# Patient Record
Sex: Female | Born: 1955
Health system: Southern US, Community
[De-identification: ages and names within clinical notes are randomized; demographics above are authoritative.]

## PROBLEM LIST (undated history)

## (undated) DIAGNOSIS — E039 Hypothyroidism, unspecified: Secondary | ICD-10-CM

## (undated) DIAGNOSIS — T4145XA Adverse effect of unspecified anesthetic, initial encounter: Secondary | ICD-10-CM

## (undated) DIAGNOSIS — M797 Fibromyalgia: Secondary | ICD-10-CM

## (undated) DIAGNOSIS — G2581 Restless legs syndrome: Secondary | ICD-10-CM

## (undated) DIAGNOSIS — M1990 Unspecified osteoarthritis, unspecified site: Secondary | ICD-10-CM

## (undated) DIAGNOSIS — Z973 Presence of spectacles and contact lenses: Secondary | ICD-10-CM

## (undated) DIAGNOSIS — F419 Anxiety disorder, unspecified: Secondary | ICD-10-CM

## (undated) DIAGNOSIS — B958 Unspecified staphylococcus as the cause of diseases classified elsewhere: Secondary | ICD-10-CM

## (undated) DIAGNOSIS — I499 Cardiac arrhythmia, unspecified: Secondary | ICD-10-CM

## (undated) DIAGNOSIS — I10 Essential (primary) hypertension: Secondary | ICD-10-CM

## (undated) DIAGNOSIS — R011 Cardiac murmur, unspecified: Secondary | ICD-10-CM

## (undated) DIAGNOSIS — K219 Gastro-esophageal reflux disease without esophagitis: Secondary | ICD-10-CM

## (undated) DIAGNOSIS — T8859XA Other complications of anesthesia, initial encounter: Secondary | ICD-10-CM

## (undated) HISTORY — PX: TONSILLECTOMY: SUR1361

## (undated) HISTORY — PX: CERVICAL FUSION: SHX112

## (undated) HISTORY — PX: TUBAL LIGATION: SHX77

## (undated) HISTORY — PX: BLADDER SUSPENSION: SHX72

## (undated) HISTORY — PX: PICC LINE PLACE PERIPHERAL (ARMC HX): HXRAD1248

---

## 1979-06-24 HISTORY — PX: ABDOMINAL HYSTERECTOMY: SHX81

## 1983-06-24 HISTORY — PX: CYST EXCISION: SHX5701

## 1988-06-23 HISTORY — PX: CERVICAL FUSION: SHX112

## 1999-05-07 ENCOUNTER — Other Ambulatory Visit: Admission: RE | Admit: 1999-05-07 | Discharge: 1999-05-07 | Payer: Self-pay | Admitting: Obstetrics and Gynecology

## 2000-03-18 ENCOUNTER — Encounter: Admission: RE | Admit: 2000-03-18 | Discharge: 2000-03-18 | Payer: Self-pay | Admitting: Obstetrics and Gynecology

## 2000-03-18 ENCOUNTER — Encounter: Payer: Self-pay | Admitting: Obstetrics and Gynecology

## 2000-06-01 ENCOUNTER — Other Ambulatory Visit: Admission: RE | Admit: 2000-06-01 | Discharge: 2000-06-01 | Payer: Self-pay | Admitting: Obstetrics and Gynecology

## 2001-03-26 ENCOUNTER — Encounter: Payer: Self-pay | Admitting: Obstetrics and Gynecology

## 2001-03-26 ENCOUNTER — Encounter: Admission: RE | Admit: 2001-03-26 | Discharge: 2001-03-26 | Payer: Self-pay | Admitting: Obstetrics and Gynecology

## 2001-03-29 ENCOUNTER — Encounter: Admission: RE | Admit: 2001-03-29 | Discharge: 2001-03-29 | Payer: Self-pay | Admitting: Obstetrics and Gynecology

## 2001-03-29 ENCOUNTER — Encounter: Payer: Self-pay | Admitting: Obstetrics and Gynecology

## 2001-06-07 ENCOUNTER — Other Ambulatory Visit: Admission: RE | Admit: 2001-06-07 | Discharge: 2001-06-07 | Payer: Self-pay | Admitting: Obstetrics and Gynecology

## 2002-04-07 ENCOUNTER — Encounter: Admission: RE | Admit: 2002-04-07 | Discharge: 2002-04-07 | Payer: Self-pay | Admitting: Obstetrics and Gynecology

## 2002-04-07 ENCOUNTER — Encounter: Payer: Self-pay | Admitting: Obstetrics and Gynecology

## 2002-04-11 ENCOUNTER — Encounter: Payer: Self-pay | Admitting: Obstetrics and Gynecology

## 2002-04-11 ENCOUNTER — Encounter: Admission: RE | Admit: 2002-04-11 | Discharge: 2002-04-11 | Payer: Self-pay | Admitting: Obstetrics and Gynecology

## 2002-11-04 ENCOUNTER — Ambulatory Visit (HOSPITAL_BASED_OUTPATIENT_CLINIC_OR_DEPARTMENT_OTHER): Admission: RE | Admit: 2002-11-04 | Discharge: 2002-11-04 | Payer: Self-pay | Admitting: Urology

## 2002-12-30 ENCOUNTER — Emergency Department (HOSPITAL_COMMUNITY): Admission: EM | Admit: 2002-12-30 | Discharge: 2002-12-30 | Payer: Self-pay | Admitting: Emergency Medicine

## 2003-01-08 ENCOUNTER — Emergency Department (HOSPITAL_COMMUNITY): Admission: EM | Admit: 2003-01-08 | Discharge: 2003-01-08 | Payer: Self-pay | Admitting: Emergency Medicine

## 2003-01-13 ENCOUNTER — Ambulatory Visit (HOSPITAL_COMMUNITY): Admission: RE | Admit: 2003-01-13 | Discharge: 2003-01-13 | Payer: Self-pay | Admitting: Internal Medicine

## 2003-01-25 ENCOUNTER — Encounter: Admission: RE | Admit: 2003-01-25 | Discharge: 2003-01-25 | Payer: Self-pay | Admitting: Infectious Diseases

## 2003-04-25 ENCOUNTER — Encounter: Admission: RE | Admit: 2003-04-25 | Discharge: 2003-04-25 | Payer: Self-pay | Admitting: Obstetrics and Gynecology

## 2003-08-07 ENCOUNTER — Encounter: Admission: RE | Admit: 2003-08-07 | Discharge: 2003-08-07 | Payer: Self-pay | Admitting: Obstetrics and Gynecology

## 2004-04-15 ENCOUNTER — Inpatient Hospital Stay (HOSPITAL_COMMUNITY): Admission: RE | Admit: 2004-04-15 | Discharge: 2004-04-16 | Payer: Self-pay | Admitting: Neurosurgery

## 2004-06-27 ENCOUNTER — Encounter: Admission: RE | Admit: 2004-06-27 | Discharge: 2004-06-27 | Payer: Self-pay | Admitting: Internal Medicine

## 2005-06-23 HISTORY — PX: BACK SURGERY: SHX140

## 2005-11-24 ENCOUNTER — Ambulatory Visit (HOSPITAL_COMMUNITY): Admission: RE | Admit: 2005-11-24 | Discharge: 2005-11-24 | Payer: Self-pay | Admitting: Neurosurgery

## 2006-01-08 ENCOUNTER — Ambulatory Visit (HOSPITAL_COMMUNITY): Admission: RE | Admit: 2006-01-08 | Discharge: 2006-01-08 | Payer: Self-pay | Admitting: Neurosurgery

## 2006-02-27 ENCOUNTER — Encounter: Admission: RE | Admit: 2006-02-27 | Discharge: 2006-02-27 | Payer: Self-pay | Admitting: Orthopedic Surgery

## 2006-03-25 ENCOUNTER — Ambulatory Visit (HOSPITAL_COMMUNITY): Admission: RE | Admit: 2006-03-25 | Discharge: 2006-03-25 | Payer: Self-pay | Admitting: *Deleted

## 2006-03-25 ENCOUNTER — Encounter (INDEPENDENT_AMBULATORY_CARE_PROVIDER_SITE_OTHER): Payer: Self-pay | Admitting: *Deleted

## 2006-04-09 ENCOUNTER — Inpatient Hospital Stay (HOSPITAL_COMMUNITY): Admission: RE | Admit: 2006-04-09 | Discharge: 2006-04-13 | Payer: Self-pay | Admitting: Neurosurgery

## 2007-03-18 ENCOUNTER — Encounter: Admission: RE | Admit: 2007-03-18 | Discharge: 2007-03-18 | Payer: Self-pay | Admitting: Obstetrics and Gynecology

## 2007-06-11 ENCOUNTER — Encounter: Admission: RE | Admit: 2007-06-11 | Discharge: 2007-06-11 | Payer: Self-pay | Admitting: Neurosurgery

## 2007-06-14 ENCOUNTER — Encounter: Admission: RE | Admit: 2007-06-14 | Discharge: 2007-06-14 | Payer: Self-pay | Admitting: Neurosurgery

## 2007-06-24 HISTORY — PX: FOOT SURGERY: SHX648

## 2008-04-07 ENCOUNTER — Encounter: Admission: RE | Admit: 2008-04-07 | Discharge: 2008-04-07 | Payer: Self-pay | Admitting: Obstetrics and Gynecology

## 2008-05-09 ENCOUNTER — Ambulatory Visit (HOSPITAL_BASED_OUTPATIENT_CLINIC_OR_DEPARTMENT_OTHER): Admission: RE | Admit: 2008-05-09 | Discharge: 2008-05-10 | Payer: Self-pay | Admitting: Orthopedic Surgery

## 2009-04-09 ENCOUNTER — Encounter: Admission: RE | Admit: 2009-04-09 | Discharge: 2009-04-09 | Payer: Self-pay | Admitting: Obstetrics and Gynecology

## 2010-04-02 ENCOUNTER — Encounter: Admission: RE | Admit: 2010-04-02 | Discharge: 2010-04-02 | Payer: Self-pay | Admitting: Neurosurgery

## 2010-04-23 ENCOUNTER — Encounter: Admission: RE | Admit: 2010-04-23 | Discharge: 2010-04-23 | Payer: Self-pay | Admitting: Obstetrics and Gynecology

## 2010-07-13 ENCOUNTER — Encounter: Payer: Self-pay | Admitting: Orthopedic Surgery

## 2010-11-05 NOTE — Op Note (Signed)
NAMEJAKYIAH, [name]               ACCOUNT NO.:  [id number]   MEDICAL RECORD NO.:  [id number]          PATIENT TYPE:  AMB   LOCATION:  DSC                          FACILITY:  [facility]   PHYSICIAN:  Leonides Grills, M.D.     DATE OF BIRTH:  Oct 15, 1955   DATE OF PROCEDURE:  05/09/2008  DATE OF DISCHARGE:                               OPERATIVE REPORT   PREOPERATIVE DIAGNOSES:  1. Right first tarsometatarsal joint arthritis with hypermobility.  2. Right second and third tarsometatarsal joint arthritis.  3. Right hallux valgus.  4. Right tight gastrocnemius.   POSTOPERATIVE DIAGNOSES:  1. Right first tarsometatarsal joint arthritis with hypermobility.  2. Right second and third tarsometatarsal joint arthritis.  3. Right hallux valgus.  4. Right tight gastrocnemius.   OPERATION:  1. Right first tarsometatarsal joint fusion with osteotomy.  2. Right second and third tarsometatarsal joint fusions without      osteotomy.  3. Right local bone graft.  4. Stress x-rays right foot.  5. Right modified McBride bunionectomy.  6. Right great toe digital nerve neurolysis.  7. Right gastrocnemius slide.   ANESTHESIA:  General with popliteal block.   SURGEON:  Leonides Grills, MD   ASSISTANT:  Richardean Canal, PA-C   ESTIMATED BLOOD LOSS:  Minimal.   TOURNIQUET TIME:  Approximately an hour.   COMPLICATIONS:  None.   DISPOSITION:  Stable to PR.   INDICATIONS:  A 55 year old female who has had long-standing medial  midfoot pain as well as a hallux valgus deformity with pain and  metatarsalgia that was interfering with her life to the point she cannot  do what she wants to do with a tight gastroc.  She was consented for the  above procedure.  All risks including infection, nerve or vessel injury,  nonunion, malunion, hardware irritation and hardware failure, persistent  pain, worsening pain, prolonged recovery, stiffness, arthritis,  recurrence of hallux valgus deformity, development of hallux  varus,  persistent second and third metatarsalgia and wound complications were  all explained.  Questions were encouraged and answered.   OPERATION:  The patient was brought to the operating room and placed in  supine position after adequate general endotracheal tube anesthesia was  administered with popliteal blocks as well as Ancef 1 g IV piggyback.  A  small bump was placed on the right ipsilateral hip and the right lower  extremity is prepped and draped in a sterile manner and proximally  placed a thigh tourniquet.  We started the procedure with a longitudinal  incision from medial aspect of the gastrocnemius musculotendinous  junction.  Dissection was carried down through the skin.  Hemostasis was  obtained.  Fascia was opened in line with the incision.  Conjoined  region was then developed through the gastroc soleus.  Soft tissue was  then elevated off the posterior aspect of the gastrocnemius.  Sural  nerve identified and protected posteriorly.  The gastrocnemius was then  released with curved Mayo scissors.  This had excellent release of tight  gastroc.  The area was copiously irrigated with normal saline.  Subcu  was closed with  Vicryl, skin was closed with 4-0 Monocryl subcuticular  stitch, Steri-Strips were applied and limb was then gravity  exsanguinated and the tourniquet was elevated to 290 mmHg.  A  longitudinal incision midline over the medial aspect of the right great  toe MTP joint was then made.  Dissection was carried down through the  skin and hemostasis was obtained.  Dorsomedial great toe digital nerve  was carefully dissected out and a formal digital nerve neurolysis was  then performed.  Once this was freed up, this was carefully retracted  out of harm's way throughout the remaining part of the procedure in this  area.  L-shaped capsulotomy was then made.  Simple bunionectomy was then  performed with a sagittal saw.  Rocky Link Johnson's ridge was then rounded off  with  a rongeur.  The joint and area were copiously irrigated with normal  saline.  Lateral capsule was then released with a curved Beaver blade  protecting the cartilage surfaces with a Therapist, nutritional.  This had an  outstanding release of the tight capsule laterally.  We were then able  to relocate the sesamoids anatomically.  The areas were copiously  irrigated with normal saline.  Then made a longitudinal incision over  the dorsal aspect midline between the EHL and EHB.  Dissection was  carried down through the skin and hemostasis was obtained.  Interval  between EHL and EHB was then developed.  Both tendons were protected in  the respective tendon.  The first and second TMT joints were carefully  dissected out and soft tissue was elevated off of the dorsal aspect of  both of these joints respectively.  There was a large amount of erosion  in both joints.  We then performed a closing wedge plantar flexion  osteotomy of the first TMT joint by removing a progressive bone from  both the medial, plantar and lateral aspect of the medial cuneiform as  well as the base of first metatarsal until the surfaces were perfect on  C-arm guidance using a two-point reduction clamp.  Once this was done,  we removed the remaining cartilage from the second TMT joint as well as  the inner cuneiform area and base of second medial cuneiform region as  well.  Multiple 2-mm drill holes were placed on either side of the  joint.  We then made a longitudinal incision over the third TMT joint  after we visualized with a C-arm that it was completely destroyed in  regards to the articular surface.  Dissection was carried down through  the skin and hemostasis was obtained.  The extensor tendons were  carefully retracted out of harm's way.  Muscle was elevated off the  dorsal aspect of the joint and base of third metatarsal and lateral  cuneiform respectively and the third TMT joint was then entered.  The  remaining  cartilages were then removed from this area and the surfaces  were prepared for anatomic configuration.  Multiple 2-mm holes were  placed on either side of the joint as well.  We then went back to the  first TMT joint and reduced this anatomically with a two-point reduction  clamp and provisionally fixed with a 2 mm K-wire.  We then using a bur  created a notch at the base of the first metatarsal.  We then placed a  3.5-mm fully-threaded cortical lag screw using a 3.5 and 2.5-mm drill  hole respectively through the notch.  This had excellent purchase and  compression across  the fusion site.  K-wire was removed.  This was  replaced with 3.5-mm fully-threaded cortical lag screw using a 3.5 and  2.5-mm drill respectively.  This had excellent purchase and compression  across the fusion site.  We then reduced the second TMT joint and placed  two 3.5-mm fully threaded cortical set screws using a 2.5-mm drill hole  respectively.  This was done through a small incision medially.  This  had excellent compression and maintenance of compression across the  second TMT joint.  We then reduced the third TMT joint with a two-point  reduction clamp and with a bur created a notch at the base of the third  metatarsal.  We then placed a 3.5-mm fully threaded cortical set screws  and 2.5 mm drill hole respectively.  This had excellent purchase and  maintenance of the compression across this joint but an hour to the  procedure, we had to deflate the tourniquet because we were developing a  venous tourniquet and we went without tourniquet due to her obesity and  the fact that we did not have a bloodless field.  This made the  procedure a lot more difficult but we were still able to obtain the  compression and anatomic correction required.  We then returned back to  the great toe MTP joint, copiously irrigated the area with normal saline  and then with the toe held in reduced position as well as the sesamoids   located, we reconstructed and advanced the medial capsule with a 2-0  Vicryl stitch.  This was advanced both superiorly and proximally.  This  had an Conservation officer, historic buildings.  Range of motion of the toe was excellent.  Stress x-rays were obtained in AP, lateral, and oblique views.  This  showed no gross motion, fixation, proposition, and excellent alignment  as well.  Sesamoids were located.  There was no pulsatile bleeding.  Subcu was closed with 4-0 PDS.  Skin was closed with 4-0 nylon.  Sterile  dressing was applied.  Roger Mann dressing was applied.  Modified Jones  dressing was applied with the ankle in neutral dorsiflexion.  The  patient was stable to the PR.      Leonides Grills, M.D.  Electronically Signed     PB/MEDQ  D:  05/09/2008  T:  05/09/2008  Job:  [id number]

## 2010-11-08 NOTE — Discharge Summary (Signed)
[name], BARLEY               ACCOUNT NO.:  [id number]   MEDICAL RECORD NO.:  [id number]          PATIENT TYPE:  INP   LOCATION:  3033                         FACILITY:  [facility]   PHYSICIAN:  [name], M.D.DATE OF BIRTH:  10-23-1955   DATE OF ADMISSION:  04/09/2006  DATE OF DISCHARGE:  04/13/2006                                 DISCHARGE SUMMARY   ADMISSION HISTORY AND PHYSICAL EXAMINATION:  Patient is a 55 year old woman  who presented with a left lumbar radiculopathy noted to left L5-S1 synovial  cyst resulting from advanced left L5-S1 facet arthropathy.  Decision was to  proceed with resection of synovial cyst and arthrodesis at the L5-S1 level.  Further details of her history are included in her admission note.  Examination revealed intact strength and sensation.  Again further details  of her admission physical examination are included in her admission note.   HOSPITAL COURSE:  The patient was admitted and underwent bilateral L5-S1  lumbar laminotomy/facetectomy, microdiscectomy and resection of the left L5-  S1 synovial cyst and bilateral L5-S1 posterior lumbar interbody fusion and  with AVS peak interbody implants and VITOSS in the bilateral L5-S1 posterior  lumbar arthrodesis with 90D posterior instrumentation.   Postoperatively, the patient has had moderate residual discomfort.  Overall,  the lower extremities have felt better.  She also had difficulties with  muscular spasm.  She was initially started on Flexeril, but Dr. Jeral Fruit was  covering over the weekend, started her on Valium yesterday as well.  She is  still having some muscular spasm, but is continuing to the Flexeril and  Valium as needed.  Her Foley was discontinued and she was able to void  without difficulty and, at this point, we are discharging her home with  instructions regarding wound care and activities.  Her wound is healing  nicely.  There is no erythema, swelling or drainage.  She had been  given  prescriptions for Percocet one to two tablets p.o. q.4 to 6 hours p.r.n.  pain, 60 tablets prescribed, no refills.  Flexeril 10 mg t.i.d. p.r.n.  muscle spasms, 50 tablets prescribed, one refill and Valium 5 mg tablets one  t.i.d. p.r.n. muscle spasms, 50 tablets prescribed, no refills.  She has  also been prescribed a rolling walker with 5 inch wheels and a 3-in-1 to be  used as a raised toilet seat.  She is to return to my office in 3 weeks for  follow up with AP and lateral lumbar spine x-rays.   DISCHARGE DIAGNOSES:  1. Lumbar synovial cyst.  2. Lumbar spondylosis.  3. Lumbar degenerative disc disease.  4. Lumbo radiculopathy.  5. Lumbar spondylothesis.      [name], M.D.  Electronically Signed     RWN/MEDQ  D:  04/13/2006  T:  04/13/2006  Job:  [id number]

## 2010-11-08 NOTE — Op Note (Signed)
NAMESAFFRON, BUSEY               ACCOUNT NO.:  [id number]   MEDICAL RECORD NO.:  [id number]          PATIENT TYPE:  INP   LOCATION:  2899                         FACILITY:  [facility]   PHYSICIAN:  [name], M.D.DATE OF BIRTH:  1955/09/07   DATE OF PROCEDURE:  04/15/2004  DATE OF DISCHARGE:                                 OPERATIVE REPORT   PREOPERATIVE DIAGNOSES:  1.  C3-4 and C4-5 spondylitic disk herniations.  2.  Spondylosis.  3.  Degenerative disk disease.  4.  Stenosis.   POSTOPERATIVE DIAGNOSES:  1.  C3-4 and C4-5 spondylitic disk herniations.  2.  Spondylosis.  3.  Degenerative disk disease.  4.  Stenosis.   PROCEDURE:  C3-4 and C4-5 anterior cervical diskectomy arthrodesis with  iliac crest allograft and ____________ cervical plating.   SURGEON:  [name], M.D.   ASSISTANT:  Danae Orleans. Venetia Maxon, M.D.   ANESTHESIA:  General endotracheal.   INDICATION:  The patient is a 55 year old woman, who presented with neck  pain and bilateral cervical radicular symptoms, right worse than left.  She  was found to have rather advanced spondylosis and degenerative disk disease  at C3-4 and C4-5 with associated spondylitic disk herniation and relative  canal stenosis, and the decision was made to proceed with decompression and  arthrodesis.   DESCRIPTION OF PROCEDURE:  The patient was brought to the operating room and  placed under general endotracheal anesthesia.  The patient was placed in 10  pounds of halter traction.  Neck was prepped with Betadine surgical solution  and draped in a sterile fashion.  A horizontal incision was made in the left  neck.  The incision was infiltrated with local anesthetic with epinephrine.  Bipolar electrocautery was used to maintain hemostasis.  Dissection was  carried down to the subcutaneous tissue and then to the platysma, and then  dissection was carried through the avascular plane ____________  jugular  vein laterally and the  trachea and esophagus medially.  The ventral aspects  of the vertebral column was identified and a localizing x-ray taken at C3-4  and C4-5 intervertebral disk space was identified.  Diskectomy was begun at  each level of the incision and the annulus continued with microcurettes and  pituitary rongeurs.  The microscope was draped and brought onto the field to  provide additional magnification, illumination, and visualization, and the  remainder of the dissection was performed using microdissection and  microsurgical technique.  The cartilaginous endplates of each of the  foraminal bodies were removed using microcurettes along with the Xmax drill,  and then the posterior overgrowth was removed using the Xmax drill along  with the 2 mm Kerrison punch with a foot plate.  There was significant  spondylitic overgrowth posteriorly at each level, and this was carefully  removed along with the thickened posterior longitudinal ligament.  The  spondylitic disk herniation was removed, and good decompression of the  spinal canals was achieved at each level.  Hemostasis was established with  the use of Gelfoam soaked in thrombin, and then anterior osteophytic  overgrowth was removed using an  osteophyte removal tool.  We used bone  sizers to select the height of grafts for each of the intervertebral disk  spaces.  A 7 mm graft was used at C3-4 and a 6 mm graft at the C4-5.  Each  was hydrated in a saline solution and positioned in the intravertebral disk  space and countersunk.  We then selected a 29 mm tether cervical plate, and  it was positioned over the fusion construct and secured to the C3 and C5  vertebra with a pair of 4 x 14 mm screws and a single 4 x 4 two millimeter  screws and a single 4 x 4 two millimeter screw used at C4.  Each screw hole  was drilled and tapped and the screw was placed.  Final tightening was done  of all five screws, and then an x-ray was taken with showed the grafts,   plates, and screws all in position.  The cervical traction had been  discontinued.  Once the bone grafts were in place but prior to the plating,  the wound was irrigated with Bacitracin solution, checked for hemostasis,  and then once that was confirmed we proceeded with closure of the platysma,  closed with interrupted inverted 3-0 Vicryl suture, the subcutaneous and  subcuticular closed with interrupted inverted 3-0 Vicryl sutures.  The skin  is reapproximated with Dermabond.  The patient tolerated the procedure well.  The estimated blood loss was 100 mL.  Sponge and needle count correct.  Following surgery, the patient was reversed anesthetic, extubated, and  transferred to the recovery room for further care where she was noted to be  moving all four extremities.       RWN/MEDQ  D:  04/15/2004  T:  04/15/2004  Job:  [id number]

## 2010-11-08 NOTE — Op Note (Signed)
NAME:  [name], [name]               ACCOUNT NO.:  [id number]   MEDICAL RECORD NO.:  [id number]          PATIENT TYPE:  AMB   LOCATION:  SDS                          FACILITY:  [facility]   PHYSICIAN:  [name], M.D.DATE OF BIRTH:  April 28, 1956   DATE OF PROCEDURE:  01/08/2006  DATE OF DISCHARGE:                                 OPERATIVE REPORT   PREOPERATIVE DIAGNOSIS:  Bilateral carpal tunnel syndrome.   POSTOPERATIVE DIAGNOSIS:  Bilateral carpal tunnel syndrome.   PROCEDURE:  Left carpal tunnel release.   SURGEON:  [name], M.D.   ANESTHESIA:  General endotracheal.   INDICATIONS:  The patient is a 55 year old woman who presented with  bilateral carpal tunnel syndrome.  She is a month and a half status post a  right carpal tunnel release, from which she has done well, and she returns  now for a left carpal tunnel release.   PROCEDURE:  The patient was brought to the operating room and placed under  general endotracheal anesthesia.  The left upper extremity was prepped with  Betadine solution and draped in a sterile fashion.  A curvilinear incision  was made just medial to the left thenar crease, extending from the distal  carpal crease distally into the palm.  Bipolar cautery was used to maintain  hemostasis.  Dissection was carried down through the subcutaneous tissue to  the transverse carpal ligament, which was divided from distally to  proximally.  Care was taken to avoid any injury to the underlying median  nerve, and good decompression of the median nerve was achieved.  The  transverse carpal ligament was fully opened from its distal-to-proximal  extent, then bipolar cautery was used to maintain hemostasis, and then we  proceeded with closure.  The subcutaneous layer was approximated with  interrupted and threaded 2-0 Vicryl sutures, and the skin was reapproximated  with interrupted horizontal mattress suture using 4-0 nylon.  We then  dressed the wound with  Adaptic, gauze and fluff, and was wrapped with a  Kling.  The procedure was tolerated well.  The estimated blood loss was less  than 10 mL.  Sponge and needle counts were correct.  Following surgery, the  patient was to be reversed from the anesthetic and transferred to the  recovery room for further care.      [name], M.D.  Electronically Signed     RWN/MEDQ  D:  01/08/2006  T:  01/08/2006  Job:  [id number]

## 2010-11-08 NOTE — H&P (Signed)
[name], [name]               ACCOUNT NO.:  [id number]   MEDICAL RECORD NO.:  [id number]          PATIENT TYPE:  INP   LOCATION:  3033                         FACILITY:  [facility]   PHYSICIAN:  [name], M.D.DATE OF BIRTH:  07-16-1955   DATE OF ADMISSION:  04/09/2006  DATE OF DISCHARGE:  04/13/2006                                HISTORY & PHYSICAL   HISTORY OF PRESENT ILLNESS:  Patient is a 55 year old right-handed, white  female, who has been a patient of mine for several years.  She is status  post C5-6 and C6-7 anterocervical discectomy effusion in 1990 and C3-4 and  C4-5 anterocervical discectomy in October of 2005.  She is status post  bilateral carpal tunnel releases earlier this year and she has presented  with a lumbar radiculopathy.  X-rays revealed L5-S1 facet arthropathy with a  grade I degenerative spine at L5 and S1.  MRI scan shows degenerative facet  arthropathy L5-S1, worse on the left with a small synovial cyst on the left  compressed actually to the S1 nerve root.  The patient admitted now for  decompression and arthrodesis.   PAST MEDICAL HISTORY:  Is notable for:  1. Hypothyroidism.  2. Fibromyalgia.  3. Osteoarthritis.  4. Restless leg syndrome.  5. Hiatal hernia.  6. Gastroesophageal reflux disease.   However, she does not have any history of hypertension, myocardial  infarction, gastroesophageal reflux disease or lung disease.   PREVIOUS SURGERIES:  Includes:  1. Hysterectomy.  2. Bladder sling.  3. C3-4 to C4-5 ACDF.  4. C5-6 and C6-7 ACDF.  5. Bilateral carpal tunnel releases.   MEDICATIONS AT THE TIME OF ADMISSION:  Include Vicodin p.r.n. for pain and  vitamin C.   OTHER MEDICATIONS:  Include Lyrica, Synthroid, Wellbutrin, Cymbalta,  potassium chloride, Mirapex, Voltaren, Lasix, Allegra, Protonix and Peri-  Colace.   FAMILY HISTORY:  Her mother is in great health at age 74.  Her father is in  good health at age 99 with diabetes,  heart disease and neuropathy.   SOCIAL HISTORY:  The patient is married.  She is not working.  She has never  smoked, drank or had history of substance abuse.   REVIEW OF SYSTEMS:  Notable for what is described in the history of present  illness and past medical history, but is otherwise unremarkable.   PHYSICAL EXAMINATION:  GENERAL:  Patient is a well-developed, well-nourished  white female in no acute distress.  LUNGS:  Clear to auscultation.  She has symmetrical respirations and  excursions.  HEART:  Regular rate and rhythm with S1 and S2.  There is no murmur.  ABDOMEN:  Soft, nondistended and bowel sounds are present.  VITAL SIGNS:  Temperature is 98.7.  Pulse 104.  Blood pressure 152/96.  Respiratory rate 16.  Height 5 feet 5 inches.  Weight 278 pounds.  EXTREMITY EXAMINATION:  Shows no clubbing, cyanosis or edema.  MUSCULOSKELETAL EXAMINATION:  Shows she is able to flex and bend.  She is  able to extend only to 5 to 10 degrees and is limited by pain.  Straight leg  raising is negative bilaterally.  NEUROLOGIC EXAMINATION:  Shows 5/5 strength to the lower extremities  including iliopsoas, quadriceps, dorsiflexors, EHL and plantar flexor  bilaterally.  Sensation is intact to pinprick.  The left quadriceps is 1,  the right is absent.  Gastrocnemious are minimal bilaterally.  Toes are  equivocal bilaterally.  She has normal gait and stance.   IMPRESSION:  Patient with disabling left lumbar radiculopathy despite NSAIDs  and narcotic analgesics.  She has advanced facet arthropathy on the left  side at L5-S1 with a small associated synovial cyst.  Rule out degenerative  changes are seen, otherwise, to the lumbar spine.   PLAN:  Patient will be admitted for resection of her left L5-S1 synovial  cyst and a bilateral L5-S1 posterior lumbar interbody fusion with interbody  implants and bone graft and bilateral L5-S1 posterolateral arthrodesis with  90D instrumentation, bone graft.  We  discussed the alternatives to surgery  the nature of the surgical procedures itself.  Typical limitations post-  operatively, the need for postoperative immobilization in a lumbar corset  and risks which include the risk of infection, bleeding, possible need for  transfusion, risk of nerve root dysfunction with pain, weakness, numbness,  or parasthesias, the possible need for further surgery, the risk of  recurrent disc herniation, possible need for further surgery and anesthetic  risk and myocardial infarction, stroke, pneumonia and death.  She does  understand that we will use cell saver during surgery to reduce the  likelihood of requiring transfusion.  [name] had understanding of all of  this, she wishes to go ahead with surgery and is admitted.      [name], M.D.  Electronically Signed     RWN/MEDQ  D:  04/13/2006  T:  04/13/2006  Job:  [id number]

## 2010-11-08 NOTE — Op Note (Signed)
NAMELISSY, [name]               ACCOUNT NO.:  [id number]   MEDICAL RECORD NO.:  [id number]          PATIENT TYPE:  AMB   LOCATION:  ENDO                         FACILITY:  [facility]   PHYSICIAN:  Georgiana Spinner, M.D.    DATE OF BIRTH:  [birth year]   DATE OF PROCEDURE:  03/25/2006  DATE OF DISCHARGE:                                 OPERATIVE REPORT   PROCEDURE:  Upper endoscopy with biopsy.   INDICATIONS:  GERD, rule out Barrett's esophagus.   ANESTHESIA:  Fentanyl 75 mcg, Versed 5 mg.   PROCEDURE:  With the patient mildly sedated in the left lateral decubitus  position, the Olympus videoscopic endoscope was inserted in the mouth,  passed under direct vision through the esophagus which appeared normal until  we reached distal esophagus, and there was a flame of Barrett's esophagus  photographed and biopsied.  We entered into the stomach.  The fundus, body,  antrum, duodenal bulb, second portion duodenum were visualized.  From this  point the endoscope was slowly withdrawn, taking circumferential views of  duodenal mucosa until the endoscope had been pulled back into the stomach  and placed in retroflexion to view the stomach from below.  The endoscope  was straightened and withdrawn, taking circumferential views of the  remaining gastric and esophageal mucosa, stopping to biopsy a fundic polyp.  The patient's vital signs, pulse oximeter remained stable.  The patient  tolerated procedure well without apparent complications.   FINDINGS:  Fundic gland polyp, Barrett's esophagus.  Await biopsy reports.  The patient will call me for results and follow up with me as an outpatient.           ______________________________  Georgiana Spinner, M.D.     GMO/MEDQ  D:  03/25/2006  T:  03/26/2006  Job:  [id number]

## 2010-11-08 NOTE — Op Note (Signed)
NAME:  [name], [name]                         ACCOUNT NO.:  [id number]   MEDICAL RECORD NO.:  [id number]                   PATIENT TYPE:  AMB   LOCATION:  [facility]                                 FACILITY:  [facility]   PHYSICIAN:  Mark C. Vernie Ammons, M.D.               DATE OF BIRTH:  16-Apr-1956   DATE OF PROCEDURE:  11/04/2002  DATE OF DISCHARGE:                                 OPERATIVE REPORT   PREOPERATIVE DIAGNOSIS:  Stress urinary incontinence.   POSTOPERATIVE DIAGNOSIS:  Stress urinary incontinence.   PROCEDURE:  SPARK sling.   SURGEON:  Mark C. Vernie Ammons, M.D.   ANESTHESIA:  General.   ESTIMATED BLOOD LOSS:  Less than 10 cc.   DRAINS:  None.   SPECIMENS:  None.   COMPLICATIONS:  None.   INDICATIONS:  The patient is a 55 year old white female with stress urinary  incontinence.  It began about 25 years ago and had become progressively  worse.  She was found on exam to have loss of ureterovesical junction  support with marked Q-tip hypermobility and no significant cystocele.  She  was therefore brought to the OR today for SPARK sling.  The risks,  complications, and alternatives and limitations were discussed.   DISCUSSION OF OPERATION:  After informed consent, the patient was brought to  the major OR and placed on the table and administered general anesthesia,  and moved to the dorsal lithotomy position.  The lower abdomen, genitalia,  and vagina were sterilely prepped and draped and a weighted speculum placed  in the vagina and a 16 French Foley catheter in the bladder.  I palpated the  level of the mid urethra and then injected with 1% lidocaine with  epinephrine in the subvaginal mucosa and then made a midline incision in the  vaginal mucosa dissecting laterally and again palpating the mid urethral  region.  Attention was directed to the suprapubic region where two stab  incisions were made three fingerbreadths apart.  I then made sure the  bladder was completely drained,  put the cystoscope sheath in the bladder,  and used that to move the bladder neck away from the side I was passing the  sling trocar.  The trocar was then passed into the suprapubic stab incision,  first on the left side and then right side, and out through the mid urethral  region.  SPARK sling material was then affixed to the trocars and brought  back up through the abdominal incisions.  I then replaced the Foley  catheter, placed my finger beneath the urethra and initially positioned the  sling in good position at the mid urethral level with no kinking, twisting,  or binding of the sling material.  With this in place, and no tension noted,  the sheaths were removed from the sling and sling material and the excess  material cut at skin level.  I irrigated the suprapubic incisions with  antibiotic solution and closed those with Dermabond.  The vaginal incision  was then closed with running 2-0 Vicryl suture.  Prior to closure, the area  of the sling and suburethral region were irrigated with antibiotic solution.  The patient tolerated the procedure well.  No  intraoperative complications.  Sponge, needle, and instrument counts were  reported as correct at the end of the operation.  She will be given a  prescription for Tylox #38 and Cipro XR 500 mg #7 and return to my office in  one week.                                               Mark C. Vernie Ammons, M.D.    MCO/MEDQ  D:  11/04/2002  T:  11/04/2002  Job:  [id number]

## 2010-11-08 NOTE — Op Note (Signed)
NAME:  [name], [name]               ACCOUNT NO.:  [id number]   MEDICAL RECORD NO.:  [id number]          PATIENT TYPE:  INP   LOCATION:  3033                         FACILITY:  [facility]   PHYSICIAN:  [name], M.D.DATE OF BIRTH:  Aug 06, 1955   DATE OF PROCEDURE:  04/09/2006  DATE OF DISCHARGE:                                 OPERATIVE REPORT   PREOPERATIVE DIAGNOSES:  Left L5-S1 synovial cyst, lumbar spondylosis,  lumbar degenerative disk disease and lumbar radiculopathy.   POSTOPERATIVE DIAGNOSES:  Left L5-S1 synovial cyst, lumbar spondylosis,  lumbar degenerative disk disease and lumbar radiculopathy.   PROCEDURES:  Bilateral L5-S1 lumbar laminotomy, facetectomy, and  microdiskectomy; resection of left L5-S1 synovial cyst; bilateral L5-S1  posterior lumbar interbody fusion with AVS PEEK interbody implants and  Vitoss with bone marrow aspirate; and bilateral L5-S1 posterolateral  arthrodesis with 90D posterior instrumentation and Infuse and Vitoss with  bone marrow aspirate and microdissection.   SURGEON:  [name], MD.   ASSISTANTS:  Cristi Loron, MD.; Nelia Shi. Webb Silversmith, RN.   INDICATIONS:  The patient is a 54 year old woman, who presented with a left  lumbar radiculopathy, which was found to be due to a left L5-S1 synovial  cyst compressing the existing left S1 nerve root.  There is significant  underlying facet arthropathy bilaterally, left worse than right.  The  decision was made to proceed with decompression and arthrodesis.   DESCRIPTION OF PROCEDURES:  The patient was brought to the operating room  and placed under general endotracheal anesthesia.  The patient was returned  to the prone position.  The lumbar region was prepped with Betadine Soap and  Solution and draped out in sterile fashion.  The midline was infiltrated  with local anesthetic with epinephrine.  An x-ray was taken to localize the  L5-S1 level, and a midline incision was made,  carried down through the  subcutaneous tissue.  Bipolar cautery and electrocautery were used to  maintain hemostasis.  Dissection was carried down to the lumbar fascia,  which was incised bilaterally, and then the paraspinal muscles were  dissected away from the spinous processes and laminae in subperiosteal  fashion.  Self-retaining retractors were placed.  Another x-ray was taken  and the L5-S1 level identified, and then dissection was carried out  laterally, exposing the arthropathic facets bilaterally.  The microscope was  draped and brought into the field to provide additional magnification,  illumination and visualization, and the decompression was performed using  microdissection and microsurgical technique.  Bilateral laminotomy and  facetectomy were performed using the X-Max drill and Kerrison punches.  We  were able to remove the thickened ligamentum flavum, as well as carefully  dissect the synovial cyst on the left side from the dura, from which it was  somewhat adherent.  We were able to mobilize and remove the synovial cyst in  its entirety, and we were able to decompress the thecal sac and the exiting  L5 and S1 nerve roots bilaterally.  We then identified the angles of the L5-  S1 disk.  The original epidural veins were coagulated  and sharply divided,  and then we incised the annulus bilaterally.  We proceeded with a thorough  diskectomy using a variety of microcurets and pituitary rongeurs.  The  spondylytic overgrowth on the posterior aspect of the vertebral bodies was  removed using a Kerrison punch, and we then removed the cartilaginous  endplates of the vertebral bodies to get down to a bony surface for the  arthrodesis.  We then measured the height of the intervertebral disk space  and selected 10-mm in height interbody implants.  We then probed the left S1  pedicle, aspirated the bone marrow aspirate, which had been injected over a  10-cc strip of Vitoss.  We then  selected 10 mm x 25 mm, 4-degree AVS PEEK  interbody PLIF implants.  They were packed with the Vitoss with bone marrow  aspirate, and then carefully retracting the thecal sac and nerve root, first  on 1 side and then on the other, the interbody implants were positioned in  the intervertebral disk space and countersunk.  We then brought the C-arm  fluoroscope into the operative field.  Unfortunately, even with the C-arm  fluoroscope, visualization of the pedicles and vertebral bodies was severely  limited due to the patient's obesity.  Nevertheless, both with C-arm  fluoroscopic guidance and under direct visualization, pedicle entry sites  were identified for L5 bilaterally, as well as on the right side at S1.  We  had already probed the pedicle of the left side of S1.  Each of the pedicles  was probed, examined, then, with a ball probe, and no cutouts were found.  Each of the pedicles was tapped.  Again, it was examined with a ball probe  and, again, no cutouts were found.  At L5, we placed 5.75 x 40-mm screws  bilaterally at S1.  At S1, we placed 5.75 x 35-mm screws bilaterally.  Each  pedicle had been tapped with a 5.25-mm tap.  Once both sets of screws were  in place, we selected a 30-mm, prelordosed rod.  It was placed in the screw  head and secured with locking caps.  Final tightening was done of all 4  locking caps.  We had decorticated the transverse process of L5 and the ala  of S1.  We then packed Infuse and Vitoss with bone marrow aspirate over the  transverse process and ala and in the intertransverse space, and then  proceeded with closure.  The fascia was closed with interrupted, undyed 1  Vicryl suture.  Scarpa's fascia was closed with interrupted, undyed 1 Vicryl  suture.  The subcutaneous, subcuticular were closed with interrupted,  inverted 2-0 undyed Vicryl suture, and the skin edges were closed with  Dermabond.  The wound was dressed with Adaptic and sterile gauze,  and following the procedure, the patient was turned back to a supine position to  be reversed from the anesthetic, extubated and transferred to the recovery  room in for further care.      [name], M.D.  Electronically Signed     RWN/MEDQ  D:  04/09/2006  T:  04/10/2006  Job:  [id number]

## 2010-11-08 NOTE — Op Note (Signed)
NAME:  [name], [name]               ACCOUNT NO.:  [id number]   MEDICAL RECORD NO.:  [id number]          PATIENT TYPE:  AMB   LOCATION:  SDS                          FACILITY:  [facility]   PHYSICIAN:  [name], M.D.DATE OF BIRTH:  [birth year]   DATE OF PROCEDURE:  11/24/2005  DATE OF DISCHARGE:                                 OPERATIVE REPORT   PREOPERATIVE DIAGNOSIS:  Bilateral carpal tunnel syndrome.   POSTOPERATIVE DIAGNOSIS:  Bilateral carpal tunnel syndrome.   PROCEDURE:  Right carpal tunnel release.   SURGEON:  [name], M.D.   ANESTHESIA:  General endotracheal via LMA.   INDICATIONS:  Patient is a 55 year old woman with bilateral carpal tunnel  syndrome, right worse than left.  Patient has failed nonsurgical management  and requested surgical decompression.   PROCEDURE:  Patient brought to the operating room.  Because of significant  preoperative anxiety and poor venous access, Dr. Jacklynn Bue, the  anesthesiologist, recommended general anesthesia via LMA.  The patient was  agreeable to such.   The right upper extremity was prepped with Betadine soap and solution,  draped in a sterile fashion.  Then a curvilinear incision was made just  medial to the right thenar crease from the distal carpal crease.  Dissection  was carried down through the subcutaneous tissue.  Bipolar cautery was used  to maintain hemostasis.  Dissection was carried down to the transverse  carpal ligament, which was divided in its entire proximal distal extent.  There was significant thickening of the ligament, and care was taken to  insure full decompression of the ligament, both proximally and distally.  Once the ligament was released, bipolar electrocautery was used to confirm  hemostasis.  Then we reapproximated the subcutaneous tissue with interrupted  inverted 2-0 Vicryl sutures.  The skin edges were approximated with  interrupted horizontal mattress sutures with 4-0 nylon.  The wound  was  dressed with Adaptic gauze Fluffs and wrapped with a Kling.  The procedure  was tolerated well.  The estimated blood loss was less than 10 cc.  Sponge  and needle count were correct.  Following surgery, the patient was reversed  from anesthetic, the LMA was removed, and patient was subsequently  transferred to the recovery room for further care.      [name], M.D.  Electronically Signed     RWN/MEDQ  D:  11/24/2005  T:  11/24/2005  Job:  [id number]

## 2011-03-26 LAB — BASIC METABOLIC PANEL
BUN: 16
CO2: 30
Calcium: 9.5
Chloride: 105
Creatinine, Ser: 0.76
GFR calc Af Amer: >60
GFR calc non Af Amer: >60
Glucose, Bld: 90
Potassium: 4.2
Sodium: 141

## 2011-03-26 LAB — POCT HEMOGLOBIN-HEMACUE: Hemoglobin: 14.2

## 2011-04-03 ENCOUNTER — Other Ambulatory Visit: Payer: Self-pay | Admitting: Neurosurgery

## 2011-04-03 DIAGNOSIS — R51 Headache: Secondary | ICD-10-CM

## 2011-04-04 ENCOUNTER — Ambulatory Visit
Admission: RE | Admit: 2011-04-04 | Discharge: 2011-04-04 | Disposition: A | Discharge status: Home / Self Care | Payer: BC Managed Care – PPO | Source: Ambulatory Visit | Attending: Neurosurgery | Admitting: Neurosurgery

## 2011-04-04 VITALS — BP 117/67 | HR 84

## 2011-04-04 DIAGNOSIS — R51 Headache: Secondary | ICD-10-CM

## 2011-04-04 MED ORDER — IOHEXOL 180 MG/ML  SOLN
1.0000 mL | Freq: Once | INTRAMUSCULAR | Status: AC | PRN
  Administered 2011-04-04: 1 mL via INTRAVENOUS

## 2011-04-04 MED ORDER — HYDROMORPHONE HCL 1 MG/ML IJ SOLN
1.0000 mg | Freq: Once | INTRAMUSCULAR | Status: AC
  Administered 2011-04-04: 1 mg via INTRAMUSCULAR

## 2011-04-04 MED ORDER — HYDROMORPHONE HCL 2 MG/ML IJ SOLN
1.5000 mg | Freq: Once | INTRAMUSCULAR | Status: AC
  Administered 2011-04-04: 1.5 mg via INTRAMUSCULAR

## 2011-04-04 MED ORDER — ONDANSETRON HCL 4 MG/2ML IJ SOLN
4.0000 mg | Freq: Once | INTRAMUSCULAR | Status: AC
  Administered 2011-04-04: 4 mg via INTRAMUSCULAR

## 2011-04-04 MED ORDER — PROMETHAZINE HCL 25 MG/ML IJ SOLN
12.5000 mg | Freq: Once | INTRAMUSCULAR | Status: AC
  Administered 2011-04-04: 12.5 mg via INTRAMUSCULAR

## 2011-04-04 NOTE — Progress Notes (Signed)
20cc blood drawn w/o difficulty from left [facility] space for procedure.  jkl

## 2011-06-24 HISTORY — PX: FOOT SURGERY: SHX648

## 2012-03-23 ENCOUNTER — Other Ambulatory Visit: Payer: Self-pay | Admitting: Obstetrics and Gynecology

## 2012-03-23 DIAGNOSIS — Z1231 Encounter for screening mammogram for malignant neoplasm of breast: Secondary | ICD-10-CM

## 2012-04-14 ENCOUNTER — Ambulatory Visit
Admission: RE | Admit: 2012-04-14 | Discharge: 2012-04-14 | Disposition: A | Discharge status: Home / Self Care | Payer: Self-pay | Source: Ambulatory Visit | Attending: Obstetrics and Gynecology | Admitting: Obstetrics and Gynecology

## 2012-04-14 DIAGNOSIS — Z1231 Encounter for screening mammogram for malignant neoplasm of breast: Secondary | ICD-10-CM

## 2013-03-24 ENCOUNTER — Other Ambulatory Visit: Payer: Self-pay

## 2013-03-24 DIAGNOSIS — Z1231 Encounter for screening mammogram for malignant neoplasm of breast: Secondary | ICD-10-CM

## 2013-04-19 ENCOUNTER — Ambulatory Visit
Admission: RE | Admit: 2013-04-19 | Discharge: 2013-04-19 | Disposition: A | Discharge status: Home / Self Care | Payer: No Typology Code available for payment source | Source: Ambulatory Visit

## 2013-04-19 DIAGNOSIS — Z1231 Encounter for screening mammogram for malignant neoplasm of breast: Secondary | ICD-10-CM

## 2013-06-20 ENCOUNTER — Other Ambulatory Visit: Payer: Self-pay | Admitting: Neurosurgery

## 2013-06-20 DIAGNOSIS — M48061 Spinal stenosis, lumbar region without neurogenic claudication: Secondary | ICD-10-CM

## 2013-07-11 ENCOUNTER — Ambulatory Visit
Admission: RE | Admit: 2013-07-11 | Discharge: 2013-07-11 | Disposition: A | Discharge status: Home / Self Care | Payer: No Typology Code available for payment source | Source: Ambulatory Visit | Attending: Neurosurgery | Admitting: Neurosurgery

## 2013-07-11 DIAGNOSIS — M48061 Spinal stenosis, lumbar region without neurogenic claudication: Secondary | ICD-10-CM

## 2013-07-11 MED ORDER — GADOBENATE DIMEGLUMINE 529 MG/ML IV SOLN: 20.0000 mL | Freq: Once | INTRAVENOUS | Status: AC | PRN

## 2014-02-20 ENCOUNTER — Other Ambulatory Visit: Payer: Self-pay | Admitting: Neurosurgery

## 2014-02-20 DIAGNOSIS — M21372 Foot drop, left foot: Secondary | ICD-10-CM

## 2014-02-28 ENCOUNTER — Ambulatory Visit
Admission: RE | Admit: 2014-02-28 | Discharge: 2014-02-28 | Disposition: A | Discharge status: Home / Self Care | Payer: No Typology Code available for payment source | Source: Ambulatory Visit | Attending: Neurosurgery | Admitting: Neurosurgery

## 2014-02-28 DIAGNOSIS — M21372 Foot drop, left foot: Secondary | ICD-10-CM

## 2014-02-28 MED ORDER — GADOBENATE DIMEGLUMINE 529 MG/ML IV SOLN
20.0000 mL | Freq: Once | INTRAVENOUS | Status: AC | PRN
  Administered 2014-02-28: 20 mL via INTRAVENOUS

## 2014-03-03 ENCOUNTER — Other Ambulatory Visit (HOSPITAL_COMMUNITY): Payer: Self-pay | Admitting: Neurosurgery

## 2014-03-17 ENCOUNTER — Encounter (HOSPITAL_COMMUNITY): Payer: Self-pay

## 2014-03-22 ENCOUNTER — Encounter (HOSPITAL_COMMUNITY)
Admission: RE | Admit: 2014-03-22 | Discharge: 2014-03-22 | Disposition: A | Discharge status: Home / Self Care | Payer: No Typology Code available for payment source | Source: Ambulatory Visit | Attending: Neurosurgery | Admitting: Neurosurgery

## 2014-03-22 ENCOUNTER — Encounter (HOSPITAL_COMMUNITY): Payer: Self-pay

## 2014-03-22 ENCOUNTER — Encounter (HOSPITAL_COMMUNITY)
Admission: RE | Admit: 2014-03-22 | Discharge: 2014-03-22 | Disposition: A | Discharge status: Home / Self Care | Payer: No Typology Code available for payment source | Source: Ambulatory Visit | Attending: Anesthesiology | Admitting: Anesthesiology

## 2014-03-22 DIAGNOSIS — Q762 Congenital spondylolisthesis: Secondary | ICD-10-CM | POA: Insufficient documentation

## 2014-03-22 DIAGNOSIS — Z01810 Encounter for preprocedural cardiovascular examination: Secondary | ICD-10-CM | POA: Insufficient documentation

## 2014-03-22 DIAGNOSIS — Z01812 Encounter for preprocedural laboratory examination: Secondary | ICD-10-CM | POA: Insufficient documentation

## 2014-03-22 DIAGNOSIS — M48062 Spinal stenosis, lumbar region with neurogenic claudication: Secondary | ICD-10-CM | POA: Insufficient documentation

## 2014-03-22 DIAGNOSIS — M47817 Spondylosis without myelopathy or radiculopathy, lumbosacral region: Secondary | ICD-10-CM | POA: Insufficient documentation

## 2014-03-22 DIAGNOSIS — I10 Essential (primary) hypertension: Secondary | ICD-10-CM | POA: Insufficient documentation

## 2014-03-22 DIAGNOSIS — M216X9 Other acquired deformities of unspecified foot: Secondary | ICD-10-CM | POA: Insufficient documentation

## 2014-03-22 HISTORY — DX: Anxiety disorder, unspecified: F41.9

## 2014-03-22 HISTORY — DX: Unspecified osteoarthritis, unspecified site: M19.90

## 2014-03-22 HISTORY — DX: Gastro-esophageal reflux disease without esophagitis: K21.9

## 2014-03-22 HISTORY — DX: Essential (primary) hypertension: I10

## 2014-03-22 HISTORY — DX: Adverse effect of unspecified anesthetic, initial encounter: T41.45XA

## 2014-03-22 HISTORY — DX: Restless legs syndrome: G25.81

## 2014-03-22 HISTORY — DX: Other complications of anesthesia, initial encounter: T88.59XA

## 2014-03-22 HISTORY — DX: Hypothyroidism, unspecified: E03.9

## 2014-03-22 HISTORY — DX: Fibromyalgia: M79.7

## 2014-03-22 LAB — CBC
HCT: 41.5 % (ref 36.0–46.0)
Hemoglobin: 14 g/dL (ref 12.0–15.0)
MCH: 30.3 pg (ref 26.0–34.0)
MCHC: 33.7 g/dL (ref 30.0–36.0)
MCV: 89.8 fL (ref 78.0–100.0)
Platelets: 309 10*3/uL (ref 150–400)
RBC: 4.62 MIL/uL (ref 3.87–5.11)
RDW: 13.4 % (ref 11.5–15.5)
WBC: 5.9 10*3/uL (ref 4.0–10.5)

## 2014-03-22 LAB — BASIC METABOLIC PANEL
Anion gap: 13 (ref 5–15)
BUN: 15 mg/dL (ref 6–23)
CO2: 27 mEq/L (ref 19–32)
Calcium: 9.8 mg/dL (ref 8.4–10.5)
Chloride: 104 mEq/L (ref 96–112)
Creatinine, Ser: 0.7 mg/dL (ref 0.50–1.10)
GFR calc Af Amer: >90 mL/min (ref >90)
GFR calc non Af Amer: >90 mL/min (ref >90)
Glucose, Bld: 97 mg/dL (ref 70–99)
Potassium: 3.8 mEq/L (ref 3.7–5.3)
Sodium: 144 mEq/L (ref 137–147)

## 2014-03-22 LAB — TYPE AND SCREEN
ABO/RH(D): O POS
Antibody Screen: NEGATIVE

## 2014-03-22 LAB — SURGICAL PCR SCREEN
MRSA, PCR: NEGATIVE
Staphylococcus aureus: NEGATIVE

## 2014-03-22 NOTE — Pre-Procedure Instructions (Signed)
MARALEE HIGUCHI  03/22/2014   Your procedure is scheduled on:  Thursday, October 8  Report to [facility] Main Entrance "A" at 7:00 AM.  Call this number if you have problems the morning of surgery: [phone]   Remember:   Do not eat food or drink liquids after midnight.   Take these medicines the morning of surgery with A SIP OF WATER: bupropion, nexium, neurontin,  mirapex   Do not wear jewelry, make-up or nail polish.  Do not wear lotions, powders, or perfumes. You may wear deodorant.  Do not shave 48 hours prior to surgery. Men may shave face and neck.  Do not bring valuables to the hospital.  [facility] is not responsible                  for any belongings or valuables.               Contacts, dentures or bridgework may not be worn into surgery.  Leave suitcase in the car. After surgery it may be brought to your room.  For patients admitted to the hospital, discharge time is determined by your                treatment team.               Patients discharged the day of surgery will not be allowed to drive  home.  Name and phone number of your driver:   Special Instructions: review preparing for surgery handout   Please read over the following fact sheets that you were given: Pain Booklet, Coughing and Deep Breathing, Blood Transfusion Information, MRSA Information and Surgical Site Infection Prevention

## 2014-03-22 NOTE — Progress Notes (Signed)
Request ekg from Dr. [facility]

## 2014-03-23 HISTORY — PX: LUMBAR FUSION: SHX111

## 2014-03-29 MED ORDER — CEFAZOLIN SODIUM-DEXTROSE 2-3 GM-% IV SOLR
2.0000 g | INTRAVENOUS | Status: AC
  Administered 2014-03-30 (×2): 2 g via INTRAVENOUS
  Filled 2014-03-29: qty 50

## 2014-03-30 ENCOUNTER — Inpatient Hospital Stay (HOSPITAL_COMMUNITY): Payer: Medicaid Other | Admitting: Certified Registered"

## 2014-03-30 ENCOUNTER — Observation Stay (HOSPITAL_COMMUNITY)
Admission: RE | Admit: 2014-03-30 | Discharge: 2014-04-01 | Disposition: A | Discharge status: Home / Self Care | Payer: Medicaid Other | Source: Ambulatory Visit | Attending: Neurosurgery | Admitting: Neurosurgery

## 2014-03-30 ENCOUNTER — Encounter (HOSPITAL_COMMUNITY)
Admission: RE | Disposition: A | Discharge status: Home / Self Care | Payer: Self-pay | Source: Ambulatory Visit | Attending: Neurosurgery

## 2014-03-30 ENCOUNTER — Encounter (HOSPITAL_COMMUNITY): Payer: Medicaid Other | Admitting: Certified Registered"

## 2014-03-30 ENCOUNTER — Inpatient Hospital Stay (HOSPITAL_COMMUNITY): Payer: Medicaid Other

## 2014-03-30 ENCOUNTER — Encounter (HOSPITAL_COMMUNITY): Payer: Self-pay | Admitting: *Deleted

## 2014-03-30 DIAGNOSIS — F419 Anxiety disorder, unspecified: Secondary | ICD-10-CM | POA: Insufficient documentation

## 2014-03-30 DIAGNOSIS — Z6841 Body Mass Index (BMI) 40.0 and over, adult: Secondary | ICD-10-CM | POA: Insufficient documentation

## 2014-03-30 DIAGNOSIS — M4316 Spondylolisthesis, lumbar region: Secondary | ICD-10-CM | POA: Diagnosis not present

## 2014-03-30 DIAGNOSIS — Z881 Allergy status to other antibiotic agents status: Secondary | ICD-10-CM | POA: Insufficient documentation

## 2014-03-30 DIAGNOSIS — E039 Hypothyroidism, unspecified: Secondary | ICD-10-CM | POA: Insufficient documentation

## 2014-03-30 DIAGNOSIS — K219 Gastro-esophageal reflux disease without esophagitis: Secondary | ICD-10-CM | POA: Diagnosis not present

## 2014-03-30 DIAGNOSIS — M4806 Spinal stenosis, lumbar region: Secondary | ICD-10-CM | POA: Insufficient documentation

## 2014-03-30 DIAGNOSIS — M797 Fibromyalgia: Secondary | ICD-10-CM | POA: Diagnosis not present

## 2014-03-30 DIAGNOSIS — M1990 Unspecified osteoarthritis, unspecified site: Secondary | ICD-10-CM | POA: Insufficient documentation

## 2014-03-30 DIAGNOSIS — G2581 Restless legs syndrome: Secondary | ICD-10-CM | POA: Insufficient documentation

## 2014-03-30 DIAGNOSIS — I10 Essential (primary) hypertension: Secondary | ICD-10-CM | POA: Insufficient documentation

## 2014-03-30 DIAGNOSIS — Z79899 Other long term (current) drug therapy: Secondary | ICD-10-CM | POA: Insufficient documentation

## 2014-03-30 DIAGNOSIS — M48062 Spinal stenosis, lumbar region with neurogenic claudication: Secondary | ICD-10-CM | POA: Diagnosis present

## 2014-03-30 DIAGNOSIS — M4326 Fusion of spine, lumbar region: Secondary | ICD-10-CM

## 2014-03-30 SURGERY — POSTERIOR LUMBAR FUSION 2 LEVEL
Anesthesia: General | Site: Back

## 2014-03-30 MED ORDER — THROMBIN 20000 UNITS EX SOLR
CUTANEOUS | Status: DC | PRN
  Administered 2014-03-30 (×2): via TOPICAL

## 2014-03-30 MED ORDER — OXYCODONE HCL 5 MG/5ML PO SOLN: 5.0000 mg | Freq: Once | ORAL | Status: AC | PRN

## 2014-03-30 MED ORDER — PROPOFOL 10 MG/ML IV BOLUS
INTRAVENOUS | Status: AC
  Filled 2014-03-30: qty 20

## 2014-03-30 MED ORDER — LOSARTAN POTASSIUM-HCTZ 100-12.5 MG PO TABS: 1.0000 | ORAL_TABLET | Freq: Every day | ORAL | Status: DC

## 2014-03-30 MED ORDER — ACETAMINOPHEN 10 MG/ML IV SOLN
INTRAVENOUS | Status: AC
  Administered 2014-03-30: 1000 mg via INTRAVENOUS
  Filled 2014-03-30: qty 100

## 2014-03-30 MED ORDER — BUPIVACAINE HCL (PF) 0.5 % IJ SOLN
INTRAMUSCULAR | Status: DC | PRN
  Administered 2014-03-30: 15 mL

## 2014-03-30 MED ORDER — MIDAZOLAM HCL 2 MG/2ML IJ SOLN
INTRAMUSCULAR | Status: AC
  Filled 2014-03-30: qty 2

## 2014-03-30 MED ORDER — DIAZEPAM 5 MG/ML IJ SOLN
INTRAMUSCULAR | Status: AC
  Filled 2014-03-30: qty 2

## 2014-03-30 MED ORDER — LOSARTAN POTASSIUM 50 MG PO TABS
100.0000 mg | ORAL_TABLET | Freq: Every day | ORAL | Status: DC
  Filled 2014-03-30 (×3): qty 2

## 2014-03-30 MED ORDER — GABAPENTIN 600 MG PO TABS
600.0000 mg | ORAL_TABLET | Freq: Two times a day (BID) | ORAL | Status: DC
  Administered 2014-03-30 – 2014-04-01 (×4): 600 mg via ORAL
  Filled 2014-03-30 (×6): qty 1

## 2014-03-30 MED ORDER — ALBUMIN HUMAN 5 % IV SOLN
INTRAVENOUS | Status: DC | PRN
  Administered 2014-03-30 (×2): via INTRAVENOUS

## 2014-03-30 MED ORDER — KETOROLAC TROMETHAMINE 30 MG/ML IJ SOLN: 30.0000 mg | Freq: Once | INTRAMUSCULAR | Status: DC

## 2014-03-30 MED ORDER — ACETAMINOPHEN 650 MG RE SUPP: 650.0000 mg | RECTAL | Status: DC | PRN

## 2014-03-30 MED ORDER — MIDAZOLAM HCL 5 MG/5ML IJ SOLN
INTRAMUSCULAR | Status: DC | PRN
  Administered 2014-03-30: 1 mg via INTRAVENOUS
  Administered 2014-03-30: 2 mg via INTRAVENOUS

## 2014-03-30 MED ORDER — DIPHENHYDRAMINE HCL 50 MG/ML IJ SOLN
INTRAMUSCULAR | Status: AC
  Filled 2014-03-30: qty 1

## 2014-03-30 MED ORDER — SODIUM CHLORIDE 0.9 % IJ SOLN: 3.0000 mL | INTRAMUSCULAR | Status: DC | PRN

## 2014-03-30 MED ORDER — SODIUM CHLORIDE 0.9 % IR SOLN
Status: DC | PRN
  Administered 2014-03-30 (×3)

## 2014-03-30 MED ORDER — KETOROLAC TROMETHAMINE 30 MG/ML IJ SOLN
INTRAMUSCULAR | Status: AC
  Filled 2014-03-30: qty 1

## 2014-03-30 MED ORDER — ZOLPIDEM TARTRATE 5 MG PO TABS: 5.0000 mg | ORAL_TABLET | Freq: Every evening | ORAL | Status: DC | PRN

## 2014-03-30 MED ORDER — OXYCODONE HCL 5 MG PO TABS
5.0000 mg | ORAL_TABLET | Freq: Once | ORAL | Status: AC | PRN
  Administered 2014-03-30: 5 mg via ORAL

## 2014-03-30 MED ORDER — MENTHOL 3 MG MT LOZG: 1.0000 | LOZENGE | OROMUCOSAL | Status: DC | PRN

## 2014-03-30 MED ORDER — LACTATED RINGERS IV SOLN
INTRAVENOUS | Status: DC | PRN
  Administered 2014-03-30 (×4): via INTRAVENOUS

## 2014-03-30 MED ORDER — DIPHENHYDRAMINE HCL 50 MG/ML IJ SOLN
INTRAMUSCULAR | Status: DC | PRN
  Administered 2014-03-30: 10 mg via INTRAVENOUS

## 2014-03-30 MED ORDER — PANTOPRAZOLE SODIUM 40 MG PO TBEC
40.0000 mg | DELAYED_RELEASE_TABLET | Freq: Every day | ORAL | Status: DC
  Administered 2014-03-31 – 2014-04-01 (×2): 40 mg via ORAL
  Filled 2014-03-30 (×2): qty 1

## 2014-03-30 MED ORDER — CYCLOBENZAPRINE HCL 10 MG PO TABS: 10.0000 mg | ORAL_TABLET | Freq: Three times a day (TID) | ORAL | Status: DC | PRN

## 2014-03-30 MED ORDER — ACETAMINOPHEN 325 MG PO TABS: 650.0000 mg | ORAL_TABLET | ORAL | Status: DC | PRN

## 2014-03-30 MED ORDER — ALPRAZOLAM 0.25 MG PO TABS
0.2500 mg | ORAL_TABLET | Freq: Every evening | ORAL | Status: DC | PRN
  Administered 2014-03-30 – 2014-03-31 (×2): 0.25 mg via ORAL
  Filled 2014-03-30 (×2): qty 1

## 2014-03-30 MED ORDER — HYDROMORPHONE HCL 1 MG/ML IJ SOLN
0.2500 mg | INTRAMUSCULAR | Status: DC | PRN
  Administered 2014-03-30 (×4): 0.5 mg via INTRAVENOUS

## 2014-03-30 MED ORDER — LIDOCAINE-EPINEPHRINE 1 %-1:100000 IJ SOLN
INTRAMUSCULAR | Status: DC | PRN
  Administered 2014-03-30: 15 mL

## 2014-03-30 MED ORDER — CEFAZOLIN SODIUM-DEXTROSE 2-3 GM-% IV SOLR
INTRAVENOUS | Status: AC
  Filled 2014-03-30: qty 50

## 2014-03-30 MED ORDER — DEXAMETHASONE SODIUM PHOSPHATE 4 MG/ML IJ SOLN
INTRAMUSCULAR | Status: DC | PRN
  Administered 2014-03-30: 8 mg via INTRAVENOUS

## 2014-03-30 MED ORDER — LIDOCAINE HCL (CARDIAC) 20 MG/ML IV SOLN
INTRAVENOUS | Status: DC | PRN
  Administered 2014-03-30: 50 mg via INTRAVENOUS

## 2014-03-30 MED ORDER — BISACODYL 10 MG RE SUPP
10.0000 mg | Freq: Every day | RECTAL | Status: DC | PRN
Start: 2014-03-30 — End: 2014-04-01

## 2014-03-30 MED ORDER — KCL IN DEXTROSE-NACL 20-5-0.45 MEQ/L-%-% IV SOLN
INTRAVENOUS | Status: DC
  Administered 2014-03-31: 02:00:00 via INTRAVENOUS
  Filled 2014-03-30 (×9): qty 1000

## 2014-03-30 MED ORDER — PROPOFOL 10 MG/ML IV BOLUS
INTRAVENOUS | Status: DC | PRN
  Administered 2014-03-30: 180 mg via INTRAVENOUS

## 2014-03-30 MED ORDER — SUFENTANIL CITRATE 50 MCG/ML IV SOLN
INTRAVENOUS | Status: DC | PRN
  Administered 2014-03-30 (×3): 10 ug via INTRAVENOUS
  Administered 2014-03-30: 20 ug via INTRAVENOUS
  Administered 2014-03-30 (×3): 10 ug via INTRAVENOUS

## 2014-03-30 MED ORDER — PHENOL 1.4 % MT LIQD: 1.0000 | OROMUCOSAL | Status: DC | PRN

## 2014-03-30 MED ORDER — HYDROCHLOROTHIAZIDE 12.5 MG PO CAPS
12.5000 mg | ORAL_CAPSULE | Freq: Every day | ORAL | Status: DC
  Administered 2014-03-30 – 2014-04-01 (×3): 12.5 mg via ORAL
  Filled 2014-03-30 (×3): qty 1

## 2014-03-30 MED ORDER — ESTRADIOL 1 MG PO TABS
1.0000 mg | ORAL_TABLET | Freq: Every day | ORAL | Status: DC
  Administered 2014-03-31 – 2014-04-01 (×2): 1 mg via ORAL
  Filled 2014-03-30 (×2): qty 1

## 2014-03-30 MED ORDER — HYDROMORPHONE HCL 1 MG/ML IJ SOLN
INTRAMUSCULAR | Status: AC
  Filled 2014-03-30: qty 1

## 2014-03-30 MED ORDER — HYDROXYZINE HCL 25 MG PO TABS
50.0000 mg | ORAL_TABLET | ORAL | Status: DC | PRN
  Administered 2014-03-30: 50 mg via ORAL
  Filled 2014-03-30: qty 2

## 2014-03-30 MED ORDER — SUFENTANIL CITRATE 50 MCG/ML IV SOLN
INTRAVENOUS | Status: AC
  Filled 2014-03-30: qty 1

## 2014-03-30 MED ORDER — THROMBIN 5000 UNITS EX SOLR
OROMUCOSAL | Status: DC | PRN
  Administered 2014-03-30 (×2): via TOPICAL

## 2014-03-30 MED ORDER — DEXAMETHASONE SODIUM PHOSPHATE 4 MG/ML IJ SOLN
INTRAMUSCULAR | Status: AC
  Filled 2014-03-30: qty 2

## 2014-03-30 MED ORDER — ROCURONIUM BROMIDE 50 MG/5ML IV SOLN
INTRAVENOUS | Status: AC
  Filled 2014-03-30: qty 1

## 2014-03-30 MED ORDER — MAGNESIUM HYDROXIDE 400 MG/5ML PO SUSP: 30.0000 mL | Freq: Every day | ORAL | Status: DC | PRN

## 2014-03-30 MED ORDER — ONDANSETRON HCL 4 MG/2ML IJ SOLN
INTRAMUSCULAR | Status: AC
  Filled 2014-03-30: qty 2

## 2014-03-30 MED ORDER — PHENTERMINE HCL 30 MG PO CAPS: 30.0000 mg | ORAL_CAPSULE | ORAL | Status: DC

## 2014-03-30 MED ORDER — SODIUM CHLORIDE 0.9 % IJ SOLN
3.0000 mL | Freq: Two times a day (BID) | INTRAMUSCULAR | Status: DC
  Administered 2014-03-31 (×2): 3 mL via INTRAVENOUS

## 2014-03-30 MED ORDER — DIAZEPAM 5 MG/ML IJ SOLN
2.5000 mg | Freq: Once | INTRAMUSCULAR | Status: AC
  Administered 2014-03-30: 2.5 mg via INTRAVENOUS

## 2014-03-30 MED ORDER — MIDAZOLAM BOLUS VIA INFUSION
1.0000 mg | Freq: Once | INTRAVENOUS | Status: AC
  Administered 2014-03-30: 1 mg via INTRAVENOUS

## 2014-03-30 MED ORDER — ONDANSETRON HCL 4 MG/2ML IJ SOLN: 4.0000 mg | Freq: Once | INTRAMUSCULAR | Status: DC | PRN

## 2014-03-30 MED ORDER — LEVOTHYROXINE SODIUM 200 MCG PO TABS
200.0000 ug | ORAL_TABLET | Freq: Every day | ORAL | Status: DC
  Administered 2014-03-31 – 2014-04-01 (×2): 200 ug via ORAL
  Filled 2014-03-30 (×4): qty 1

## 2014-03-30 MED ORDER — LIDOCAINE HCL (CARDIAC) 20 MG/ML IV SOLN
INTRAVENOUS | Status: AC
  Filled 2014-03-30: qty 5

## 2014-03-30 MED ORDER — ONDANSETRON HCL 4 MG/2ML IJ SOLN
4.0000 mg | Freq: Four times a day (QID) | INTRAMUSCULAR | Status: DC | PRN
  Filled 2014-03-30: qty 2

## 2014-03-30 MED ORDER — SUCCINYLCHOLINE CHLORIDE 20 MG/ML IJ SOLN
INTRAMUSCULAR | Status: AC
  Filled 2014-03-30: qty 1

## 2014-03-30 MED ORDER — OXYCODONE-ACETAMINOPHEN 5-325 MG PO TABS
1.0000 | ORAL_TABLET | ORAL | Status: DC | PRN
  Administered 2014-03-31 – 2014-04-01 (×6): 2 via ORAL
  Filled 2014-03-30 (×6): qty 2

## 2014-03-30 MED ORDER — HYDROCODONE-ACETAMINOPHEN 5-325 MG PO TABS: 1.0000 | ORAL_TABLET | ORAL | Status: DC | PRN

## 2014-03-30 MED ORDER — ONDANSETRON 8 MG/NS 50 ML IVPB
8.0000 mg | Freq: Four times a day (QID) | INTRAVENOUS | Status: DC | PRN
  Filled 2014-03-30: qty 8

## 2014-03-30 MED ORDER — OXYCODONE HCL 5 MG PO TABS
ORAL_TABLET | ORAL | Status: AC
  Filled 2014-03-30: qty 1

## 2014-03-30 MED ORDER — ONDANSETRON HCL 4 MG/2ML IJ SOLN
INTRAMUSCULAR | Status: DC | PRN
  Administered 2014-03-30: 4 mg via INTRAVENOUS

## 2014-03-30 MED ORDER — SODIUM CHLORIDE 0.9 % IV SOLN
INTRAVENOUS | Status: DC | PRN
  Administered 2014-03-30: 12:00:00 via INTRAVENOUS

## 2014-03-30 MED ORDER — ROCURONIUM BROMIDE 100 MG/10ML IV SOLN
INTRAVENOUS | Status: DC | PRN
  Administered 2014-03-30: 50 mg via INTRAVENOUS

## 2014-03-30 MED ORDER — ALUM & MAG HYDROXIDE-SIMETH 200-200-20 MG/5ML PO SUSP: 30.0000 mL | Freq: Four times a day (QID) | ORAL | Status: DC | PRN

## 2014-03-30 MED ORDER — 0.9 % SODIUM CHLORIDE (POUR BTL) OPTIME
TOPICAL | Status: DC | PRN
  Administered 2014-03-30 (×3): 1000 mL

## 2014-03-30 MED ORDER — MORPHINE SULFATE 4 MG/ML IJ SOLN: 4.0000 mg | INTRAMUSCULAR | Status: DC | PRN

## 2014-03-30 MED ORDER — BUPROPION HCL ER (SMOKING DET) 150 MG PO TB12
450.0000 mg | ORAL_TABLET | Freq: Every day | ORAL | Status: DC
  Administered 2014-03-31 – 2014-04-01 (×2): 450 mg via ORAL
  Filled 2014-03-30 (×2): qty 3

## 2014-03-30 MED ORDER — PRAMIPEXOLE DIHYDROCHLORIDE 0.125 MG PO TABS
0.1250 mg | ORAL_TABLET | Freq: Three times a day (TID) | ORAL | Status: DC
  Administered 2014-03-30 – 2014-04-01 (×5): 0.125 mg via ORAL
  Filled 2014-03-30 (×7): qty 1

## 2014-03-30 MED ORDER — KETOROLAC TROMETHAMINE 30 MG/ML IJ SOLN
30.0000 mg | Freq: Four times a day (QID) | INTRAMUSCULAR | Status: AC
  Administered 2014-03-30 – 2014-04-01 (×8): 30 mg via INTRAVENOUS
  Filled 2014-03-30 (×9): qty 1

## 2014-03-30 MED ORDER — SODIUM CHLORIDE 0.9 % IJ SOLN
INTRAMUSCULAR | Status: AC
  Filled 2014-03-30: qty 10

## 2014-03-30 MED ORDER — ONDANSETRON HCL 4 MG/2ML IJ SOLN
4.0000 mg | Freq: Four times a day (QID) | INTRAMUSCULAR | Status: DC | PRN
  Administered 2014-03-30: 4 mg via INTRAVENOUS

## 2014-03-30 SURGICAL SUPPLY — 94 items
ADH SKN CLS APL DERMABOND .7 (GAUZE/BANDAGES/DRESSINGS)
ADH SKN CLS LQ APL DERMABOND (GAUZE/BANDAGES/DRESSINGS) ×3
APL SKNCLS STERI-STRIP NONHPOA (GAUZE/BANDAGES/DRESSINGS)
BAG DECANTER FOR FLEXI CONT (MISCELLANEOUS) ×6 IMPLANT
BENZOIN TINCTURE PRP APPL 2/3 (GAUZE/BANDAGES/DRESSINGS) IMPLANT
BLADE CLIPPER SURG (BLADE) IMPLANT
BRUSH SCRUB EZ PLAIN DRY (MISCELLANEOUS) ×2 IMPLANT
BUR ACRON 5.0MM COATED (BURR) ×2 IMPLANT
BUR MATCHSTICK NEURO 3.0 LAGG (BURR) ×2 IMPLANT
CANISTER SUCT 3000ML (MISCELLANEOUS) ×2 IMPLANT
CAP INCHANGES (Cap) ×8 IMPLANT
CAP LCK SPNE (Orthopedic Implant) ×4 IMPLANT
CAP LOCK SPINE RADIUS (Orthopedic Implant) ×4 IMPLANT
CAP LOCKING (Orthopedic Implant) ×8 IMPLANT
CONT SPEC 4OZ CLIKSEAL STRL BL (MISCELLANEOUS) ×4 IMPLANT
COVER BACK TABLE 24X17X13 BIG (DRAPES) IMPLANT
COVER TABLE BACK 60X90 (DRAPES) ×2 IMPLANT
DERMABOND ADHESIVE PROPEN (GAUZE/BANDAGES/DRESSINGS) ×3
DERMABOND ADVANCED (GAUZE/BANDAGES/DRESSINGS)
DERMABOND ADVANCED .7 DNX12 (GAUZE/BANDAGES/DRESSINGS) IMPLANT
DERMABOND ADVANCED .7 DNX6 (GAUZE/BANDAGES/DRESSINGS) ×3 IMPLANT
DRAPE C-ARM 42X72 X-RAY (DRAPES) ×4 IMPLANT
DRAPE LAPAROTOMY 100X72X124 (DRAPES) ×2 IMPLANT
DRAPE POUCH INSTRU U-SHP 10X18 (DRAPES) ×2 IMPLANT
DRAPE PROXIMA HALF (DRAPES) ×4 IMPLANT
DRSG EMULSION OIL 3X3 NADH (GAUZE/BANDAGES/DRESSINGS) IMPLANT
ELECT REM PT RETURN 9FT ADLT (ELECTROSURGICAL) ×2
ELECTRODE REM PT RTRN 9FT ADLT (ELECTROSURGICAL) ×1 IMPLANT
GAUZE SPONGE 4X4 12PLY STRL (GAUZE/BANDAGES/DRESSINGS) ×2 IMPLANT
GAUZE SPONGE 4X4 16PLY XRAY LF (GAUZE/BANDAGES/DRESSINGS) ×2 IMPLANT
GLOVE BIOGEL PI IND STRL 6.5 (GLOVE) ×1 IMPLANT
GLOVE BIOGEL PI IND STRL 8 (GLOVE) ×5 IMPLANT
GLOVE BIOGEL PI IND STRL 8.5 (GLOVE) ×1 IMPLANT
GLOVE BIOGEL PI INDICATOR 6.5 (GLOVE) ×1
GLOVE BIOGEL PI INDICATOR 8 (GLOVE) ×5
GLOVE BIOGEL PI INDICATOR 8.5 (GLOVE) ×1
GLOVE ECLIPSE 6.5 STRL STRAW (GLOVE) ×2 IMPLANT
GLOVE ECLIPSE 7.5 STRL STRAW (GLOVE) ×12 IMPLANT
GLOVE ECLIPSE 8.5 STRL (GLOVE) ×2 IMPLANT
GLOVE EXAM NITRILE LRG STRL (GLOVE) IMPLANT
GLOVE EXAM NITRILE MD LF STRL (GLOVE) IMPLANT
GLOVE EXAM NITRILE XL STR (GLOVE) IMPLANT
GLOVE EXAM NITRILE XS STR PU (GLOVE) IMPLANT
GLOVE SURG SS PI 6.5 STRL IVOR (GLOVE) ×2 IMPLANT
GOWN STRL REUS W/ TWL LRG LVL3 (GOWN DISPOSABLE) ×1 IMPLANT
GOWN STRL REUS W/ TWL XL LVL3 (GOWN DISPOSABLE) ×2 IMPLANT
GOWN STRL REUS W/TWL 2XL LVL3 (GOWN DISPOSABLE) ×4 IMPLANT
GOWN STRL REUS W/TWL LRG LVL3 (GOWN DISPOSABLE) ×2
GOWN STRL REUS W/TWL XL LVL3 (GOWN DISPOSABLE) ×4
HEMOSTAT POWDER SURGIFOAM 1G (HEMOSTASIS) ×4 IMPLANT
KIT BASIN OR (CUSTOM PROCEDURE TRAY) ×2 IMPLANT
KIT INFUSE SMALL (Orthopedic Implant) ×2 IMPLANT
KIT ROOM TURNOVER OR (KITS) ×2 IMPLANT
MILL MEDIUM DISP (BLADE) ×2 IMPLANT
NEEDLE 18GX1X1/2 (RX/OR ONLY) (NEEDLE) IMPLANT
NEEDLE ASPIRATION RAN815N 8X15 (NEEDLE) ×1 IMPLANT
NEEDLE ASPIRATION RANFAC 8X15 (NEEDLE) ×1
NEEDLE BONE MARROW 8GAX6 (NEEDLE) IMPLANT
NEEDLE SPNL 18GX3.5 QUINCKE PK (NEEDLE) ×2 IMPLANT
NEEDLE SPNL 22GX3.5 QUINCKE BK (NEEDLE) ×2 IMPLANT
NS IRRIG 1000ML POUR BTL (IV SOLUTION) ×6 IMPLANT
PACK LAMINECTOMY NEURO (CUSTOM PROCEDURE TRAY) ×2 IMPLANT
PAD ARMBOARD 7.5X6 YLW CONV (MISCELLANEOUS) ×6 IMPLANT
PATTIES SURGICAL .5 X.5 (GAUZE/BANDAGES/DRESSINGS) ×2 IMPLANT
PATTIES SURGICAL .5 X1 (DISPOSABLE) IMPLANT
PATTIES SURGICAL 1X1 (DISPOSABLE) ×6 IMPLANT
PEEK PLIF AVS 11X25X4 (Peek) ×4 IMPLANT
PEEK PLIF AVS 7X25X4D (Peek) ×4 IMPLANT
ROD 70MM (Rod) ×2 IMPLANT
ROD 80MM (Rod) ×2 IMPLANT
ROD SPNL 70X5.5 NS TI RDS (Rod) ×1 IMPLANT
ROD SPNL 80X5.5XNS TI RDS (Rod) ×1 IMPLANT
SCREW 5.75X45MM (Screw) ×4 IMPLANT
SCREW 6.75X45MM (Screw) ×4 IMPLANT
SPONGE LAP 4X18 X RAY DECT (DISPOSABLE) ×2 IMPLANT
SPONGE NEURO XRAY DETECT 1X3 (DISPOSABLE) ×2 IMPLANT
SPONGE SURGIFOAM ABS GEL 100 (HEMOSTASIS) ×4 IMPLANT
STAPLER SKIN PROX WIDE 3.9 (STAPLE) ×2 IMPLANT
STRIP BIOACTIVE VITOSS 25X100X (Neuro Prosthesis/Implant) ×4 IMPLANT
STRIP CLOSURE SKIN 1/2X4 (GAUZE/BANDAGES/DRESSINGS) IMPLANT
SUT PROLENE 6 0 BV (SUTURE) IMPLANT
SUT VIC AB 1 CT1 18XBRD ANBCTR (SUTURE) ×4 IMPLANT
SUT VIC AB 1 CT1 8-18 (SUTURE) ×8
SUT VIC AB 2-0 CP2 18 (SUTURE) ×8 IMPLANT
SYR 20CC LL (SYRINGE) ×2 IMPLANT
SYR 3ML LL SCALE MARK (SYRINGE) IMPLANT
SYR 5ML LL (SYRINGE) IMPLANT
SYR CONTROL 10ML LL (SYRINGE) ×4 IMPLANT
TAPE CLOTH SURG 4X10 WHT LF (GAUZE/BANDAGES/DRESSINGS) ×2 IMPLANT
TOWEL OR 17X24 6PK STRL BLUE (TOWEL DISPOSABLE) ×2 IMPLANT
TOWEL OR 17X26 10 PK STRL BLUE (TOWEL DISPOSABLE) ×2 IMPLANT
TRAP SPECIMEN MUCOUS 40CC (MISCELLANEOUS) ×2 IMPLANT
TRAY FOLEY CATH 14FRSI W/METER (CATHETERS) ×2 IMPLANT
WATER STERILE IRR 1000ML POUR (IV SOLUTION) ×2 IMPLANT

## 2014-03-30 NOTE — Plan of Care (Signed)
Problem: Consults Goal: Diagnosis - Spinal Surgery Outcome: Completed/Met Date Met:  03/30/14 Thoraco/Lumbar Spine Fusion

## 2014-03-30 NOTE — Anesthesia Preprocedure Evaluation (Addendum)
Anesthesia Evaluation  Patient identified by MRN, date of birth, ID band Patient awake    Reviewed: Allergy & Precautions, H&P , NPO status , Patient's Chart, lab work & pertinent test results  History of Anesthesia Complications (+) AWARENESS UNDER ANESTHESIA  Airway Mallampati: II TM Distance: >3 FB Neck ROM: Full    Dental  (+) Teeth Intact, Dental Advisory Given   Pulmonary  breath sounds clear to auscultation        Cardiovascular hypertension, Rhythm:Regular Rate:Normal     Neuro/Psych    GI/Hepatic   Endo/Other    Renal/GU      Musculoskeletal   Abdominal (+) + obese,   Peds  Hematology   Anesthesia Other Findings   Reproductive/Obstetrics                          Anesthesia Physical Anesthesia Plan  ASA: III  Anesthesia Plan: General   Post-op Pain Management:    Induction: Intravenous  Airway Management Planned: Oral ETT  Additional Equipment: None  Intra-op Plan:   Post-operative Plan: Extubation in OR  Informed Consent: I have reviewed the patients History and Physical, chart, labs and discussed the procedure including the risks, benefits and alternatives for the proposed anesthesia with the patient or authorized representative who has indicated his/her understanding and acceptance.   Dental advisory given  Plan Discussed with: CRNA, Anesthesiologist and Surgeon  Anesthesia Plan Comments: (Lumbar Spondylosis  Anxiety/Fibromyalgia Hypertension GERD H/O awareness with anesthesia  Plan GA with oral ETT  [name])       Anesthesia Quick Evaluation

## 2014-03-30 NOTE — Op Note (Signed)
03/30/2014  2:07 PM  PATIENT:  [name]  58 y.o. female  PRE-OPERATIVE DIAGNOSIS:  L4-5 grade 2 dynamic degenerative spondylolisthesis, left  foot drop, lumbar stenosis with neurogenic claudication,  lumbar spondylosis, lumbar degenerative disc disease  POST-OPERATIVE DIAGNOSIS:  L4-5 grade 2 dynamic degenerative spondylolisthesis, left  foot drop, lumbar stenosis with neurogenic claudication,  lumbar spondylosis, lumbar degenerative disc disease  PROCEDURE:  Procedure(s):  Bilateral L3-4 and L4-5 lumbar decompression , including bilateral L3, L4, and L5 laminectomy, bilateral L3-4 and L4-5 facetectomy and foraminotomy, with decompression of the exiting L3, L4, and L5 nerve roots, with decompression and work beyond that required for interbody arthrodesis; bilateral L3-4 and L4-5 posterior lumbar interbody arthrodesis with AVS peek interbody implants, Vitoss BA with bone marrow aspirate, and infuse; bilateral L3-L5 posterior lateral arthrodesis with radius and 90 D. posterior instrumentation (the former at the L3 and L4 levels, and the latter existing at the L5 and S1 levels), Vitoss BA with bone marrow aspirate, and infuse  SURGEON:  Surgeon(s): [name], MD Barnett Abu, MD  ASSISTANTS: Barnett Abu, M.D.  ANESTHESIA:   general  EBL:  Total I/O In: 4250 [I.V.:3400; Blood:350; IV Piggyback:500] Out: 1035 [Urine:135; Blood:900]  BLOOD ADMINISTERED:350 CC CELLSAVER  COUNT: Correct per nursing staff  DICTATION: Patient was brought to the operating room placed under general endotracheal anesthesia. The patient was turned to prone position, the lumbar region was prepped with Betadine soap and solution and draped in a sterile fashion. The patient has an existing L5-S1 arthrodesis with 90 D. posterior instrumentation.  The midline was infiltrated with local anesthesia with epinephrine. A midline incision was made, through the existing midline incision, and extended rostrally, and  carried down through the subcutaneous tissue, bipolar cautery and electrocautery were used to maintain hemostasis. Dissection was carried down to the lumbar fascia. The fascia was incised bilaterally and the paraspinal muscles were dissected with a spinous process and lamina in a subperiosteal fashion. An x-ray was taken for localization and the L3-4 and L4-5 levels was localized. Dissection was then carried out laterally, and the existing posterior instrumentation  at L5 and S1 was identified  and exposed. Dissection was then carried out over the facet complexes of L3-4 and L4-5,  and the transverse processes of  L3, L4, and L5 were exposed and decorticated. decompression was initiated with a bilateral L3, L4, and L5 laminectomy. This was performed using double-action rongeurs, the high-speed drill, and Kerrison punches. There were severe stenosis at the L4-5 level and moderate stenosis at the L3-4 level. We were able to decompress the spinal canal and thecal sac.  Dissection was carried out laterally including facetectomy and foraminotomies with decompression of the stenotic compression of the  L3, L4, and L5  nerve roots. Once the decompression of the stenotic compression of the thecal sac and exiting nerve roots was completed we proceeded with the posterior lumbar interbody arthrodesis. The annulus at each level was incised bilaterally and the disc space entered. A thorough discectomy was performed using pituitary rongeurs and curettes. Once the discectomy was completed we began to prepare the endplate surfaces. We then measured the height of the intervertebral disc space. We selected  7 x 25 x 4  AVS peek interbody implants for the  L3-4  level, and  11 x 25 x 43  AVS peek interbody implants for the  L4-5  level.  The C-arm fluoroscope was then draped and brought in the field and we identified the pedicle entry  points bilaterally at the  L3 and L4 levels. Each of the 4 pedicles was probed, we aspirated bone  marrow aspirate from the vertebral bodies, this was injected over 2 10 cc strips of Vitoss BA. Then each of the pedicles was examined with the ball probe, good bony surfaces were found and no bony cuts were found.  The pedicles at L3 were then tapped with a 5.25 mm tap, again examined with the ball probe good threading was found and no bony cuts were found. We then placed 5.75 by  45  millimeter screws bilaterally at the  L3  Level.  The pedicles at L4 were then tapped with a 6.25 mm tap, again examined with the ball probe, good threading was found and no bony cuts were found. We then placed 6.75 by  45  millimeter screws bilaterally at the  L4 level. We then unlocked the existing locking caps at L5 and S1. The locking caps were removed, as were the rods bilaterally at the L5-S1 level.   We then packed the AVS peek interbody implants with Vitoss BA with bone marrow aspirate it is , and then placed the first implant at the  L3-4 level on the right side, carefully retracting the thecal sac and nerve root medially. We then went back to the left side and packed the midline with additional Vitoss BA with bone marrow aspirate and infuse,  and then placed a second implant on the left side again retracting the thecal sac and nerve root medially. Then at the L4-5  level, we placed the first implant on the right side, carefully retracting the thecal sac and nerve root medially. We then went back to the left side and packed the midline with additional Vitoss BA with bone marrow aspirate and infuse, and then placed a second implant on the left side, again retracting the thecal sac and nerve root medially. Additional Vitoss BA with bone marrow aspirate was packed lateral to the left-sided implant.   We then packed the lateral gutter over the transverse processes and intertransverse space with Vitoss BA with bone marrow aspirate and infuse. We then selected pre-lordosed  rods, they were placed within the screw heads, at L3,  L4, L5, and S1, and secured with locking caps. We used radius locking caps at L3-L4, and 90 D. locking caps at L5 and S1.  Once all 8 locking caps were placed,final tightening was performed against  the corresponding counter torque.  The wound had been irrigated multiple times during the procedure with saline solution and bacitracin solution, good hemostasis was established with a combination of bipolar cautery, Gelfoam with thrombin, and Surgifoam . Once good hemostasis was confirmed we proceeded with closure paraspinal muscles deep fascia and Scarpa's fascia were closed with interrupted undyed 1 Vicryl sutures the subcutaneous and subcuticular closed with interrupted inverted 2-0 undyed Vicryl sutures the skin edges were approximated with  Dermabond.  A dressing of sterile gauze and Hypafix was applied.  Following surgery the patient was turned back to the supine position to be reversed and the anesthetic extubated and transferred to the recovery room for further care.   PLAN OF CARE: Admit to inpatient   PATIENT DISPOSITION:  PACU - hemodynamically stable.   Delay start of Pharmacological VTE agent (>24hrs) due to surgical blood loss or risk of bleeding:  yes

## 2014-03-30 NOTE — Anesthesia Postprocedure Evaluation (Signed)
  Anesthesia Post-op Note  Patient: [name]  Procedure(s) Performed: Procedure(s) with comments: Lumbar three-four, Lumbar four-five decompression with posterior lumbar interbody fusion with interbody prosthesis posterior lateral arthrodesis and posterior segmental instrumentation (N/A) - Lumbar three-four, Lumbar four-five decompression with posterior lumbar interbody fusion with interbody prosthesis posterior lateral arthrodesis and posterior segmental instrumentation  Patient Location: PACU  Anesthesia Type:General  Level of Consciousness: awake, alert  and oriented  Airway and Oxygen Therapy: Patient Spontanous Breathing and Patient connected to nasal cannula oxygen  Post-op Pain: mild  Post-op Assessment: Post-op Vital signs reviewed, Patient's Cardiovascular Status Stable, Respiratory Function Stable, Patent Airway, No signs of Nausea or vomiting and Pain level controlled  Post-op Vital Signs: stable  Last Vitals:  Filed Vitals:   03/30/14 1635  BP:   Pulse:   Temp: 36.4 C  Resp:     Complications: No apparent anesthesia complications

## 2014-03-30 NOTE — H&P (Signed)
Subjective: Patient is a 58 y.o. female who is admitted for treatment of advanced multilevel lumbar degeneration.  Patient is status post an L5-S1 lumbar decompression, PLIF, and PLA in October 2007. She's had very amount of difficulties with her low back over the past 5 years, undergoing periodic epidural steroid injections. She has had low back pain and bilateral lumbar radicular pain, left worse than right. However recently she's had increased left lumbar radicular pain and numbness, and on examination was found to have weakness of her left dorsiflexor and EHL. MRI and x-rays were repeated. X-rays reveal a grade 2 dynamic degenerative spondylolisthesis of L4 on L5 with multilevel lumbar degenerative disease and spondylosis at L2-3, L3-4, L4-5. MRI scan shows degenerative disease and spondylosis at these levels, with mild to moderate canal stenosis at L2-3, moderate canal stenosis and significant facet arthropathy at L3-4, and severe canal and neural foraminal stenosis at L4-5. At L4-5 the canal stenosis is somewhat worse on the right, but the neural foraminal stenosis is worse on the left.  Patient is admitted now for an L3-4 and L4-5 lumbar decompression including laminectomy, facetectomy, and foraminotomy, L3-4 and L4-5 posterior lumbar interbody arthrodesis with interbody implants and bone graft, and an L3-L5 posterolateral arthrodesis with posterior instrumentation and bone graft. The posterior instrumentation Vitoss into her existing posterior instrumentation at the L5-S1 level.   Past Medical History  Diagnosis Date  . Complication of anesthesia     during surgery she could hear staff talking ans could feel them preping her arm over 30 yrs. ago  . Hypertension   . Hypothyroidism   . Anxiety   . GERD (gastroesophageal reflux disease)   . Arthritis   . Fibromyalgia   . Restless leg syndrome     Past Surgical History  Procedure Laterality Date  . Abdominal hysterectomy  1981    partial  .  Cervical fusion  1990  . Cervical fusion      2000  . Bladder suspension      2004  . Back surgery  2007  . Foot surgery Right 2009    reconstruction with hardware  . Foot surgery Left 2013    reconstruction with hardware  . Cyst excision Left 1985    wrist  . Cyst excision  1985    chest    No prescriptions prior to admission   Allergies  Allergen Reactions  . Clindamycin/Lincomycin Rash  . Lodine [Etodolac] Rash    History  Substance Use Topics  . Smoking status: Never Smoker   . Smokeless tobacco: Not on file  . Alcohol Use: No    No family history on file.   Review of Systems A comprehensive review of systems was negative.  Objective: Vital signs in last 24 hours:    EXAM: Patient is a morbidly obese white female in discomfort, but no acute distress. Height is 5 foot 5 inches, weight is 250 pounds, BMI is 41. Lungs are clear to auscultation, the patient has symmetrical respiratory excursion. Heart has a regular rate and rhythm normal S1 and S2 no murmur.   Abdomen is soft nontender nondistended bowel sounds are present. Extremity examination shows no clubbing cyanosis or edema. Motor examination shows the iliopsoas, quadriceps, and plantar flexor are 5 bilaterally, as are the right dorsiflexor and EHL. However it the left dorsiflexor is 4 minus to 4, in the left EHL is 3.  Sensation is intact to pinprick in the distal lower extremities. Reflexes are symmetrical bilaterally. No pathologic reflexes  are present. Patient has a normal gait and stance.   Data Review:CBC    Component Value Date/Time   WBC 5.9 03/22/2014 1104   RBC 4.62 03/22/2014 1104   HGB 14.0 03/22/2014 1104   HCT 41.5 03/22/2014 1104   PLT 309 03/22/2014 1104   MCV 89.8 03/22/2014 1104   MCH 30.3 03/22/2014 1104   MCHC 33.7 03/22/2014 1104   RDW 13.4 03/22/2014 1104                          BMET    Component Value Date/Time   NA 144 03/22/2014 1104   K 3.8 03/22/2014 1104   CL 104 03/22/2014 1104    CO2 27 03/22/2014 1104   GLUCOSE 97 03/22/2014 1104   BUN 15 03/22/2014 1104   CREATININE 0.70 03/22/2014 1104   CALCIUM 9.8 03/22/2014 1104   GFRNONAA >90 03/22/2014 1104   GFRAA >90 03/22/2014 1104     Assessment/Plan: Patient with advanced degeneration in the lumbar spine as described above including a grade 2 dynamic degenerative spondylolisthesis at L4-5 and moderate canal stenosis at L3-4 and severe canal and neuroforaminal stenosis at L4-5. Symptomatically the patient had low back and bilateral lumbar radicular pain, radicular pain is worse to the left than the right. More recently she has developed a left foot drop with weakness of the left dorsiflexor and EHL. Patient is admitted now for lumbar decompression and arthrodesis.  I've discussed with the patient the nature of his condition, the nature the surgical procedure, the typical length of surgery, hospital stay, and overall recuperation, the limitations postoperatively, and risks of surgery. I discussed risks including risks of infection, bleeding, possibly need for transfusion, the risk of nerve root dysfunction with pain, weakness, numbness, or paresthesias, the risk of dural tear and CSF leakage and possible need for further surgery, the risk of failure of the arthrodesis and possibly for further surgery, the risk of anesthetic complications including myocardial infarction, stroke, pneumonia, and death. We discussed the need for postoperative immobilization in a lumbar brace. Understanding all this the patient does wish to proceed with surgery and is admitted for such.     [name], MD 03/30/2014 5:19 AM

## 2014-03-30 NOTE — Transfer of Care (Signed)
Immediate Anesthesia Transfer of Care Note  Patient: [name]  Procedure(s) Performed: Procedure(s) with comments: Lumbar three-four, Lumbar four-five decompression with posterior lumbar interbody fusion with interbody prosthesis posterior lateral arthrodesis and posterior segmental instrumentation (N/A) - Lumbar three-four, Lumbar four-five decompression with posterior lumbar interbody fusion with interbody prosthesis posterior lateral arthrodesis and posterior segmental instrumentation  Patient Location: PACU  Anesthesia Type:General  Level of Consciousness: awake  Airway & Oxygen Therapy: Patient Spontanous Breathing and Patient connected to nasal cannula oxygen  Post-op Assessment: Report given to PACU RN, Post -op Vital signs reviewed and stable and Patient moving all extremities  Post vital signs: Reviewed and stable  Complications: No apparent anesthesia complications

## 2014-03-30 NOTE — Progress Notes (Signed)
Filed Vitals:   03/30/14 1628 03/30/14 1630 03/30/14 1635 03/30/14 1655  BP: 96/52   101/45  Pulse: 79 76  72  Temp:   97.6 F (36.4 C) 97.8 F (36.6 C)  TempSrc:      Resp: 13 13  18   Weight:      SpO2: 95% 94%  98%    Patient sitting up on the side of her bed. Feels a bit woozy. Much more comfortable than in PACU. Dressing clean and dry. Moving all extremities well.  Plan: To work with a on mobilizing, and ambulating into the hall this evening. Will continue to progress through postoperative recovery.  Haematologist, MD 03/30/2014, 7:40 PM

## 2014-03-30 NOTE — Anesthesia Procedure Notes (Signed)
Procedure Name: Intubation Date/Time: 03/30/2014 7:27 AM Performed by: Charm Barges, Yeraldy Spike R Pre-anesthesia Checklist: Patient identified, Emergency Drugs available, Suction available, Patient being monitored and Timeout performed Patient Re-evaluated:Patient Re-evaluated prior to inductionOxygen Delivery Method: Circle system utilized Preoxygenation: Pre-oxygenation with 100% oxygen Intubation Type: IV induction Ventilation: Mask ventilation without difficulty Laryngoscope Size: Mac and 3 Grade View: Grade II Tube type: Oral Tube size: 7.5 mm Number of attempts: 1 Airway Equipment and Method: Stylet Placement Confirmation: ETT inserted through vocal cords under direct vision,  positive ETCO2 and breath sounds checked- equal and bilateral Secured at: 21 cm Tube secured with: Tape Dental Injury: Teeth and Oropharynx as per pre-operative assessment

## 2014-03-31 DIAGNOSIS — M4316 Spondylolisthesis, lumbar region: Secondary | ICD-10-CM | POA: Diagnosis not present

## 2014-03-31 MED FILL — Electrolyte-R (PH 7.4) Solution: INTRAVENOUS | Qty: 1000 | Status: AC

## 2014-03-31 MED FILL — Sodium Chloride Irrigation Soln 0.9%: Qty: 3000 | Status: AC

## 2014-03-31 MED FILL — Heparin Sodium (Porcine) Inj 1000 Unit/ML: INTRAMUSCULAR | Qty: 30 | Status: AC

## 2014-03-31 NOTE — Progress Notes (Signed)
Filed Vitals:   03/30/14 2210 03/30/14 2354 03/31/14 0359 03/31/14 0631  BP: 91/44 88/45 90/40  110/52  Pulse: 79 79 78 85  Temp:  98.1 F (36.7 C) 98.3 F (36.8 C) 98.7 F (37.1 C)  TempSrc:  Oral Oral Oral  Resp:  20 18 18   Weight:      SpO2:  99% 98% 100%    Patient resting in bed, has already ambulated in the halls. Patient notes complete relief of disabling radicular pain. Incisional pain much better, has only used Toradol IV overnight. Dressing clean and dry. Foley DC'd at 0600, and patient for the small amount, but nursing staff will continue to monitor voiding function.  Plan: Encouraged to ambulate at least 5 times today in the halls. We'll continue to progress through postoperative recovery.  , MD 03/31/2014, 7:51 AM

## 2014-03-31 NOTE — Progress Notes (Signed)
UR Completed.  

## 2014-04-01 DIAGNOSIS — M4316 Spondylolisthesis, lumbar region: Secondary | ICD-10-CM | POA: Diagnosis not present

## 2014-04-01 MED ORDER — CYCLOBENZAPRINE HCL 10 MG PO TABS: 10.0000 mg | ORAL_TABLET | Freq: Three times a day (TID) | ORAL | Status: DC | PRN

## 2014-04-01 MED ORDER — OXYCODONE-ACETAMINOPHEN 5-325 MG PO TABS: 1.0000 | ORAL_TABLET | ORAL | Status: DC | PRN

## 2014-04-01 NOTE — Progress Notes (Signed)
Patient alert and oriented, mae's well, voiding adequate amount of urine, swallowing without difficulty, c/o slight pain. Patient discharged home with family. Script and discharged instructions given to patient. Patient and family stated understanding of d/c instructions given and has an appointment with MD. Marin Roberts RN

## 2014-04-01 NOTE — Discharge Summary (Signed)
  Physician Discharge Summary  Patient ID: [name] MRN: [id number] DOB/AGE: [birth year] 58 y.o.  Admit date: 03/30/2014 Discharge date: 04/01/2014  Admission Diagnoses: L3-4 and L4-5 spondylolisthesis, spinal stenosis, lumbago, lumbar radiculopathy  Discharge Diagnoses: The same Active Problems:   Spinal stenosis of lumbar region with neurogenic claudication   Discharged Condition: good  Hospital Course: Dr. Newell Coral performed at L3-4 and L4-5 decompression, instrumentation, and fusion on the patient on 03/30/2014. The surgery went well.  The patient's postoperative course was unremarkable. On postoperative day #2 she requested discharge to home. The patient was given oral and written discharge instructions. All her questions were answered.  Consults: None Significant Diagnostic Studies: None Treatments: L3-4 and L4-5 decompression, instrumentation, and fusion. Discharge Exam: Blood pressure 117/48, pulse 89, temperature 97.9 F (36.6 C), temperature source Oral, resp. rate 18, weight 111.585 kg (246 lb), SpO2 100.00%. The patient is alert and pleasant. Her strength is normal in her lower extremities. Her dressing is tattered but dry.  Disposition: Home     Medication List         ALPRAZolam 0.25 MG tablet  Commonly known as:  XANAX  Take 0.25 mg by mouth at bedtime as needed for anxiety.     buPROPion 150 MG 12 hr tablet  Commonly known as:  ZYBAN  Take 450 mg by mouth daily.     cyclobenzaprine 10 MG tablet  Commonly known as:  FLEXERIL  Take 1 tablet (10 mg total) by mouth 3 (three) times daily as needed for muscle spasms.     estradiol 1 MG tablet  Commonly known as:  ESTRACE  Take 1 mg by mouth daily.     gabapentin 600 MG tablet  Commonly known as:  NEURONTIN  Take 600 mg by mouth 2 (two) times daily.     levothyroxine 200 MCG tablet  Commonly known as:  SYNTHROID, LEVOTHROID  Take 200 mcg by mouth daily before breakfast.     losartan-hydrochlorothiazide 100-12.5 MG per tablet  Commonly known as:  HYZAAR  Take 1 tablet by mouth daily.     meloxicam 15 MG tablet  Commonly known as:  MOBIC  Take 15 mg by mouth 2 (two) times daily.     NEXIUM 24HR 20 MG capsule  Generic drug:  esomeprazole  Take 20 mg by mouth daily at 12 noon.     oxyCODONE-acetaminophen 5-325 MG per tablet  Commonly known as:  PERCOCET/ROXICET  Take 1-2 tablets by mouth every 4 (four) hours as needed for moderate pain.     phentermine 30 MG capsule  Take 30 mg by mouth every morning.     pramipexole 0.125 MG tablet  Commonly known as:  MIRAPEX  Take 0.125 mg by mouth 3 (three) times daily.         SignedCristi Loron 04/01/2014, 6:56 AM

## 2014-04-01 NOTE — Discharge Instructions (Signed)

## 2014-04-01 NOTE — Progress Notes (Signed)
Utilization Review completed.  

## 2014-04-06 ENCOUNTER — Ambulatory Visit (HOSPITAL_COMMUNITY)
Admission: RE | Admit: 2014-04-06 | Discharge: 2014-04-06 | Disposition: A | Discharge status: Home / Self Care | Payer: Medicaid Other | Source: Ambulatory Visit | Attending: Neurosurgery | Admitting: Neurosurgery

## 2014-04-06 ENCOUNTER — Other Ambulatory Visit (HOSPITAL_COMMUNITY): Payer: Self-pay | Admitting: Neurosurgery

## 2014-04-06 DIAGNOSIS — M7989 Other specified soft tissue disorders: Secondary | ICD-10-CM

## 2014-04-06 DIAGNOSIS — R600 Localized edema: Secondary | ICD-10-CM

## 2014-04-06 NOTE — Progress Notes (Signed)
*  Preliminary Results* Bilateral lower extremity venous duplex completed. Bilateral lower extremities are negative for deep vein thrombosis. There is no evidence of Baker's cyst bilaterally.  04/06/2014  Azul Coffie, RVT, RDCS, RDMS  

## 2014-04-19 ENCOUNTER — Other Ambulatory Visit: Payer: Self-pay

## 2014-04-19 DIAGNOSIS — Z1231 Encounter for screening mammogram for malignant neoplasm of breast: Secondary | ICD-10-CM

## 2014-04-24 ENCOUNTER — Inpatient Hospital Stay (HOSPITAL_COMMUNITY): Payer: Medicaid Other

## 2014-04-24 ENCOUNTER — Other Ambulatory Visit: Payer: Self-pay | Admitting: Neurosurgery

## 2014-04-24 ENCOUNTER — Inpatient Hospital Stay (HOSPITAL_COMMUNITY)
Admission: AD | Admit: 2014-04-24 | Discharge: 2014-04-29 | DRG: 857 | Disposition: A | Discharge status: Home / Self Care | Payer: Medicaid Other | Source: Ambulatory Visit | Attending: Neurosurgery | Admitting: Neurosurgery

## 2014-04-24 DIAGNOSIS — K219 Gastro-esophageal reflux disease without esophagitis: Secondary | ICD-10-CM | POA: Diagnosis present

## 2014-04-24 DIAGNOSIS — F419 Anxiety disorder, unspecified: Secondary | ICD-10-CM | POA: Diagnosis present

## 2014-04-24 DIAGNOSIS — Z887 Allergy status to serum and vaccine status: Secondary | ICD-10-CM | POA: Diagnosis not present

## 2014-04-24 DIAGNOSIS — T814XXA Infection following a procedure, initial encounter: Principal | ICD-10-CM | POA: Diagnosis present

## 2014-04-24 DIAGNOSIS — T8149XA Infection following a procedure, other surgical site, initial encounter: Secondary | ICD-10-CM | POA: Diagnosis present

## 2014-04-24 DIAGNOSIS — B998 Other infectious disease: Secondary | ICD-10-CM | POA: Diagnosis present

## 2014-04-24 DIAGNOSIS — Z881 Allergy status to other antibiotic agents status: Secondary | ICD-10-CM | POA: Diagnosis not present

## 2014-04-24 DIAGNOSIS — T8189XA Other complications of procedures, not elsewhere classified, initial encounter: Secondary | ICD-10-CM | POA: Insufficient documentation

## 2014-04-24 DIAGNOSIS — Y838 Other surgical procedures as the cause of abnormal reaction of the patient, or of later complication, without mention of misadventure at the time of the procedure: Secondary | ICD-10-CM | POA: Diagnosis present

## 2014-04-24 DIAGNOSIS — Z113 Encounter for screening for infections with a predominantly sexual mode of transmission: Secondary | ICD-10-CM | POA: Insufficient documentation

## 2014-04-24 DIAGNOSIS — Z6839 Body mass index (BMI) 39.0-39.9, adult: Secondary | ICD-10-CM | POA: Diagnosis not present

## 2014-04-24 DIAGNOSIS — A4901 Methicillin susceptible Staphylococcus aureus infection, unspecified site: Secondary | ICD-10-CM | POA: Insufficient documentation

## 2014-04-24 DIAGNOSIS — M96830 Postprocedural hemorrhage and hematoma of a musculoskeletal structure following a musculoskeletal system procedure: Secondary | ICD-10-CM | POA: Diagnosis present

## 2014-04-24 DIAGNOSIS — E039 Hypothyroidism, unspecified: Secondary | ICD-10-CM | POA: Diagnosis present

## 2014-04-24 DIAGNOSIS — T8189XD Other complications of procedures, not elsewhere classified, subsequent encounter: Secondary | ICD-10-CM

## 2014-04-24 DIAGNOSIS — Z91048 Other nonmedicinal substance allergy status: Secondary | ICD-10-CM

## 2014-04-24 DIAGNOSIS — Z981 Arthrodesis status: Secondary | ICD-10-CM

## 2014-04-24 DIAGNOSIS — B9562 Methicillin resistant Staphylococcus aureus infection as the cause of diseases classified elsewhere: Secondary | ICD-10-CM | POA: Diagnosis present

## 2014-04-24 DIAGNOSIS — M1990 Unspecified osteoarthritis, unspecified site: Secondary | ICD-10-CM | POA: Diagnosis present

## 2014-04-24 DIAGNOSIS — L24A9 Irritant contact dermatitis due friction or contact with other specified body fluids: Secondary | ICD-10-CM | POA: Insufficient documentation

## 2014-04-24 DIAGNOSIS — M797 Fibromyalgia: Secondary | ICD-10-CM | POA: Diagnosis present

## 2014-04-24 DIAGNOSIS — G2581 Restless legs syndrome: Secondary | ICD-10-CM | POA: Diagnosis present

## 2014-04-24 DIAGNOSIS — I10 Essential (primary) hypertension: Secondary | ICD-10-CM | POA: Diagnosis present

## 2014-04-24 DIAGNOSIS — T148XXA Other injury of unspecified body region, initial encounter: Secondary | ICD-10-CM | POA: Insufficient documentation

## 2014-04-24 LAB — CBC WITH DIFFERENTIAL/PLATELET
Basophils Absolute: 0.1 10*3/uL (ref 0.0–0.1)
Basophils Relative: 1 % (ref 0–1)
Eosinophils Absolute: 0.1 10*3/uL (ref 0.0–0.7)
Eosinophils Relative: 1 % (ref 0–5)
HCT: 34.3 % — ABNORMAL LOW (ref 36.0–46.0)
Hemoglobin: 11.8 g/dL — ABNORMAL LOW (ref 12.0–15.0)
Lymphocytes Relative: 26 % (ref 12–46)
Lymphs Abs: 2.1 10*3/uL (ref 0.7–4.0)
MCH: 29.1 pg (ref 26.0–34.0)
MCHC: 34.4 g/dL (ref 30.0–36.0)
MCV: 84.5 fL (ref 78.0–100.0)
Monocytes Absolute: 0.8 10*3/uL (ref 0.1–1.0)
Monocytes Relative: 9 % (ref 3–12)
Neutro Abs: 5.2 10*3/uL (ref 1.7–7.7)
Neutrophils Relative %: 63 % (ref 43–77)
Platelets: 489 10*3/uL — ABNORMAL HIGH (ref 150–400)
RBC: 4.06 MIL/uL (ref 3.87–5.11)
RDW: 12.9 % (ref 11.5–15.5)
WBC: 8.2 10*3/uL (ref 4.0–10.5)

## 2014-04-24 LAB — COMPREHENSIVE METABOLIC PANEL
ALT: 16 U/L (ref 0–35)
AST: 15 U/L (ref 0–37)
Albumin: 3.3 g/dL — ABNORMAL LOW (ref 3.5–5.2)
Alkaline Phosphatase: 113 U/L (ref 39–117)
Anion gap: 15 (ref 5–15)
BUN: 11 mg/dL (ref 6–23)
CO2: 24 mEq/L (ref 19–32)
Calcium: 10.1 mg/dL (ref 8.4–10.5)
Chloride: 98 mEq/L (ref 96–112)
Creatinine, Ser: 0.65 mg/dL (ref 0.50–1.10)
GFR calc Af Amer: >90 mL/min (ref >90)
GFR calc non Af Amer: >90 mL/min (ref >90)
Glucose, Bld: 129 mg/dL — ABNORMAL HIGH (ref 70–99)
Potassium: 3.6 mEq/L — ABNORMAL LOW (ref 3.7–5.3)
Sodium: 137 mEq/L (ref 137–147)
Total Bilirubin: 0.3 mg/dL (ref 0.3–1.2)
Total Protein: 7.5 g/dL (ref 6.0–8.3)

## 2014-04-24 LAB — SEDIMENTATION RATE: Sed Rate: 60 mm/hr — ABNORMAL HIGH (ref 0–22)

## 2014-04-24 MED ORDER — BISACODYL 10 MG RE SUPP: 10.0000 mg | Freq: Every day | RECTAL | Status: DC | PRN

## 2014-04-24 MED ORDER — MAGNESIUM HYDROXIDE 400 MG/5ML PO SUSP: 30.0000 mL | Freq: Every day | ORAL | Status: DC | PRN

## 2014-04-24 MED ORDER — KCL IN DEXTROSE-NACL 20-5-0.45 MEQ/L-%-% IV SOLN
INTRAVENOUS | Status: DC
  Administered 2014-04-25: 02:00:00 via INTRAVENOUS
  Filled 2014-04-24 (×3): qty 1000

## 2014-04-24 MED ORDER — ONDANSETRON HCL 4 MG PO TABS: 4.0000 mg | ORAL_TABLET | Freq: Four times a day (QID) | ORAL | Status: DC | PRN

## 2014-04-24 MED ORDER — OXYCODONE-ACETAMINOPHEN 5-325 MG PO TABS
1.0000 | ORAL_TABLET | ORAL | Status: DC | PRN
  Administered 2014-04-26: 2 via ORAL
  Administered 2014-04-26: 1 via ORAL
  Filled 2014-04-24 (×2): qty 2

## 2014-04-24 MED ORDER — ONDANSETRON 8 MG/NS 50 ML IVPB
8.0000 mg | Freq: Four times a day (QID) | INTRAVENOUS | Status: DC | PRN
  Filled 2014-04-24: qty 8

## 2014-04-24 MED ORDER — HYDROCODONE-ACETAMINOPHEN 5-325 MG PO TABS
1.0000 | ORAL_TABLET | ORAL | Status: DC | PRN
  Administered 2014-04-25 – 2014-04-28 (×9): 2 via ORAL
  Filled 2014-04-24: qty 1
  Filled 2014-04-24 (×10): qty 2

## 2014-04-24 MED ORDER — ONDANSETRON HCL 4 MG/2ML IJ SOLN: 4.0000 mg | Freq: Four times a day (QID) | INTRAMUSCULAR | Status: DC | PRN

## 2014-04-24 MED ORDER — GADOBENATE DIMEGLUMINE 529 MG/ML IV SOLN
20.0000 mL | Freq: Once | INTRAVENOUS | Status: AC | PRN
  Administered 2014-04-24: 20 mL via INTRAVENOUS

## 2014-04-24 NOTE — H&P (Signed)
Subjective: Patient is a 58 y.o. female who is admitted for treatment of lumbar wound drainage. Patient is status post a lumbar decompression and arthrodesis 3-1/2 weeks ago.  She developed fever and chills 4 days ago, and wound drainage 3 days ago.  She was started 3 days ago on doxycycline 100 mg twice a day.  She came into the office today for evaluation.  The patient's by mouth intake has been poor over the past several days, and her overall energy is diminished.  I therefore recommended hospitalization for laboratories, MRI scan, and IV hydration.   Patient Active Problem List   Diagnosis Date Noted  . Spinal stenosis of lumbar region with neurogenic claudication 03/30/2014   Past Medical History  Diagnosis Date  . Complication of anesthesia     during surgery she could hear staff talking ans could feel them preping her arm over 30 yrs. ago  . Hypertension   . Hypothyroidism   . Anxiety   . GERD (gastroesophageal reflux disease)   . Arthritis   . Fibromyalgia   . Restless leg syndrome     Past Surgical History  Procedure Laterality Date  . Abdominal hysterectomy  1981    partial  . Cervical fusion  1990  . Cervical fusion      2000  . Bladder suspension      2004  . Back surgery  2007  . Foot surgery Right 2009    reconstruction with hardware  . Foot surgery Left 2013    reconstruction with hardware  . Cyst excision Left 1985    wrist  . Cyst excision  1985    chest     (Not in a hospital admission) Allergies  Allergen Reactions  . Clindamycin/Lincomycin Rash  . Influenza Vaccines Hives  . Lodine [Etodolac] Rash    History  Substance Use Topics  . Smoking status: Never Smoker   . Smokeless tobacco: Not on file  . Alcohol Use: No    No family history on file.   Review of Systems A comprehensive review of systems was negative.  Objective:  EXAM:  Patient is obese white female in no acute distress.  Pulse 118, blood pressure 137/82.  Height 5 foot 5,  weight 235 pounds, BMI 39. Lungs clear to auscultation, she has symmetrical respiratory excursion.  Heart is regular rate and rhythm, normal S1 and S2, no murmurs heard.  Abdomen is soft, nondistended, bowel sounds are present.  Wound is well-healed other than for a small 5 mm opening at the inferior aspect of the incision.  However through this there is purulent drainage.  There is no erythema or swelling around the incision.  Neurologic examination shows good strength and sensation to the lower extremities.  She has symmetrical reflexes.  She has a normal gait and stance.  She does wear a lumbar corset.  Data Review:CBC    Component Value Date/Time   WBC 5.9 03/22/2014 1104   RBC 4.62 03/22/2014 1104   HGB 14.0 03/22/2014 1104   HCT 41.5 03/22/2014 1104   PLT 309 03/22/2014 1104   MCV 89.8 03/22/2014 1104   MCH 30.3 03/22/2014 1104   MCHC 33.7 03/22/2014 1104   RDW 13.4 03/22/2014 1104                          BMET    Component Value Date/Time   NA 144 03/22/2014 1104   K 3.8 03/22/2014 1104   CL  104 03/22/2014 1104   CO2 27 03/22/2014 1104   GLUCOSE 97 03/22/2014 1104   BUN 15 03/22/2014 1104   CREATININE 0.70 03/22/2014 1104   CALCIUM 9.8 03/22/2014 1104   GFRNONAA >90 03/22/2014 1104   GFRAA >90 03/22/2014 1104     Assessment/Plan: Patient admitted for lumbar wound drainage, suspicious for a wound infection. She does have significant obesity.  I've recommended admission for hydration, labs including CMET, CBC with differential, sedimentation rate, C-reactive protein, and blood cultures 2.  I've also requested an MRI of the lumbar spine without and with contrast.  I've spoke with the patient about wound debridement, and the possibility of primary closure versus placement of a wound VAC, that I think will need to be an intraoperative decision.  We'll make further recommendations after the laboratories and MRI return.   [name], MD 04/24/2014 5:21 PM

## 2014-04-25 ENCOUNTER — Inpatient Hospital Stay (HOSPITAL_COMMUNITY): Payer: Medicaid Other | Admitting: Anesthesiology

## 2014-04-25 ENCOUNTER — Encounter (HOSPITAL_COMMUNITY)
Admission: AD | Disposition: A | Discharge status: Home / Self Care | Payer: Self-pay | Source: Ambulatory Visit | Attending: Neurosurgery

## 2014-04-25 HISTORY — PX: LUMBAR WOUND DEBRIDEMENT: SHX1988

## 2014-04-25 HISTORY — PX: APPLICATION OF WOUND VAC: SHX5189

## 2014-04-25 LAB — GRAM STAIN

## 2014-04-25 LAB — C-REACTIVE PROTEIN: CRP: 4.4 mg/dL — ABNORMAL HIGH (ref <0.60)

## 2014-04-25 SURGERY — LUMBAR WOUND DEBRIDEMENT
Anesthesia: General | Site: Back

## 2014-04-25 MED ORDER — ROCURONIUM BROMIDE 100 MG/10ML IV SOLN
INTRAVENOUS | Status: DC | PRN
  Administered 2014-04-25: 10 mg via INTRAVENOUS
  Administered 2014-04-25: 30 mg via INTRAVENOUS

## 2014-04-25 MED ORDER — VANCOMYCIN HCL IN DEXTROSE 1-5 GM/200ML-% IV SOLN
1000.0000 mg | INTRAVENOUS | Status: AC
  Administered 2014-04-25: 1000 mg via INTRAVENOUS
  Filled 2014-04-25: qty 200

## 2014-04-25 MED ORDER — ONDANSETRON HCL 4 MG/2ML IJ SOLN
INTRAMUSCULAR | Status: AC
  Filled 2014-04-25: qty 2

## 2014-04-25 MED ORDER — VANCOMYCIN HCL 10 G IV SOLR: 2000.0000 mg | Freq: Once | INTRAVENOUS | Status: DC

## 2014-04-25 MED ORDER — VANCOMYCIN HCL 1000 MG IV SOLR
1000.0000 mg | INTRAVENOUS | Status: DC | PRN
  Administered 2014-04-25: 1000 mg via INTRAVENOUS

## 2014-04-25 MED ORDER — GLYCOPYRROLATE 0.2 MG/ML IJ SOLN
INTRAMUSCULAR | Status: AC
  Filled 2014-04-25: qty 3

## 2014-04-25 MED ORDER — NEOSTIGMINE METHYLSULFATE 10 MG/10ML IV SOLN
INTRAVENOUS | Status: DC | PRN
  Administered 2014-04-25: 4 mg via INTRAVENOUS

## 2014-04-25 MED ORDER — MIDAZOLAM HCL 2 MG/2ML IJ SOLN
INTRAMUSCULAR | Status: AC
  Filled 2014-04-25: qty 2

## 2014-04-25 MED ORDER — FENTANYL CITRATE 0.05 MG/ML IJ SOLN
INTRAMUSCULAR | Status: AC
  Filled 2014-04-25: qty 5

## 2014-04-25 MED ORDER — OXYCODONE HCL 5 MG PO TABS: 5.0000 mg | ORAL_TABLET | Freq: Once | ORAL | Status: AC | PRN

## 2014-04-25 MED ORDER — VANCOMYCIN HCL IN DEXTROSE 1-5 GM/200ML-% IV SOLN
INTRAVENOUS | Status: AC
  Filled 2014-04-25: qty 200

## 2014-04-25 MED ORDER — PRAMIPEXOLE DIHYDROCHLORIDE 0.125 MG PO TABS
0.1250 mg | ORAL_TABLET | Freq: Three times a day (TID) | ORAL | Status: DC
  Administered 2014-04-25 – 2014-04-29 (×12): 0.125 mg via ORAL
  Filled 2014-04-25 (×16): qty 1

## 2014-04-25 MED ORDER — LIDOCAINE HCL (CARDIAC) 20 MG/ML IV SOLN
INTRAVENOUS | Status: AC
  Filled 2014-04-25: qty 5

## 2014-04-25 MED ORDER — THROMBIN 5000 UNITS EX SOLR
CUTANEOUS | Status: DC | PRN
  Administered 2014-04-25: 10000 [IU] via TOPICAL

## 2014-04-25 MED ORDER — GLYCOPYRROLATE 0.2 MG/ML IJ SOLN
INTRAMUSCULAR | Status: DC | PRN
  Administered 2014-04-25: 0.6 mg via INTRAVENOUS

## 2014-04-25 MED ORDER — OXYCODONE HCL 5 MG/5ML PO SOLN: 5.0000 mg | Freq: Once | ORAL | Status: AC | PRN

## 2014-04-25 MED ORDER — HYDROMORPHONE HCL 1 MG/ML IJ SOLN
INTRAMUSCULAR | Status: AC
  Filled 2014-04-25: qty 1

## 2014-04-25 MED ORDER — ONDANSETRON HCL 4 MG/2ML IJ SOLN: 4.0000 mg | Freq: Once | INTRAMUSCULAR | Status: AC | PRN

## 2014-04-25 MED ORDER — ONDANSETRON HCL 4 MG/2ML IJ SOLN
INTRAMUSCULAR | Status: DC | PRN
  Administered 2014-04-25: 4 mg via INTRAVENOUS

## 2014-04-25 MED ORDER — SODIUM CHLORIDE 0.9 % IV SOLN
500.0000 mg | Freq: Two times a day (BID) | INTRAVENOUS | Status: DC
  Filled 2014-04-25: qty 500

## 2014-04-25 MED ORDER — MEPERIDINE HCL 25 MG/ML IJ SOLN: 6.2500 mg | INTRAMUSCULAR | Status: DC | PRN

## 2014-04-25 MED ORDER — GENTAMICIN SULFATE 40 MG/ML IJ SOLN
160.0000 mg | Freq: Once | INTRAVENOUS | Status: DC
  Filled 2014-04-25 (×2): qty 4

## 2014-04-25 MED ORDER — MORPHINE SULFATE 4 MG/ML IJ SOLN
4.0000 mg | INTRAMUSCULAR | Status: DC | PRN
Start: 2014-04-25 — End: 2014-04-29
  Administered 2014-04-27: 4 mg via INTRAMUSCULAR
  Filled 2014-04-25: qty 1
  Filled 2014-04-25: qty 2

## 2014-04-25 MED ORDER — ACETAMINOPHEN 10 MG/ML IV SOLN
INTRAVENOUS | Status: AC
  Administered 2014-04-25: 1000 mg via INTRAVENOUS
  Filled 2014-04-25: qty 100

## 2014-04-25 MED ORDER — GENTAMICIN SULFATE 40 MG/ML IJ SOLN
550.0000 mg | INTRAVENOUS | Status: DC
  Administered 2014-04-26: 550 mg via INTRAVENOUS
  Filled 2014-04-25 (×2): qty 13.75

## 2014-04-25 MED ORDER — VANCOMYCIN HCL IN DEXTROSE 1-5 GM/200ML-% IV SOLN: 1000.0000 mg | Freq: Two times a day (BID) | INTRAVENOUS | Status: DC

## 2014-04-25 MED ORDER — HYDROMORPHONE HCL 1 MG/ML IJ SOLN
0.2500 mg | INTRAMUSCULAR | Status: DC | PRN
  Administered 2014-04-25 (×4): 0.5 mg via INTRAVENOUS
  Filled 2014-04-25: qty 1

## 2014-04-25 MED ORDER — SODIUM CHLORIDE 0.9 % IV SOLN
INTRAVENOUS | Status: DC
  Administered 2014-04-25 – 2014-04-28 (×4): via INTRAVENOUS

## 2014-04-25 MED ORDER — ROCURONIUM BROMIDE 50 MG/5ML IV SOLN
INTRAVENOUS | Status: AC
  Filled 2014-04-25: qty 1

## 2014-04-25 MED ORDER — NEOSTIGMINE METHYLSULFATE 10 MG/10ML IV SOLN
INTRAVENOUS | Status: AC
  Filled 2014-04-25: qty 1

## 2014-04-25 MED ORDER — GENTAMICIN IN SALINE 1.6-0.9 MG/ML-% IV SOLN
80.0000 mg | Freq: Once | INTRAVENOUS | Status: DC
  Administered 2014-04-25: 160 mg via INTRAVENOUS
  Filled 2014-04-25: qty 50

## 2014-04-25 MED ORDER — MIDAZOLAM HCL 5 MG/5ML IJ SOLN
INTRAMUSCULAR | Status: DC | PRN
  Administered 2014-04-25: 2 mg via INTRAVENOUS

## 2014-04-25 MED ORDER — PROPOFOL 10 MG/ML IV BOLUS
INTRAVENOUS | Status: AC
  Filled 2014-04-25: qty 20

## 2014-04-25 MED ORDER — VANCOMYCIN HCL IN DEXTROSE 1-5 GM/200ML-% IV SOLN
1000.0000 mg | Freq: Two times a day (BID) | INTRAVENOUS | Status: DC
  Administered 2014-04-26 – 2014-04-29 (×7): 1000 mg via INTRAVENOUS
  Filled 2014-04-25 (×8): qty 200

## 2014-04-25 MED ORDER — LIDOCAINE HCL (CARDIAC) 20 MG/ML IV SOLN
INTRAVENOUS | Status: DC | PRN
  Administered 2014-04-25: 100 mg via INTRAVENOUS
  Administered 2014-04-25 (×3): 50 mg via INTRAVENOUS

## 2014-04-25 MED ORDER — ARTIFICIAL TEARS OP OINT
TOPICAL_OINTMENT | OPHTHALMIC | Status: AC
  Filled 2014-04-25: qty 3.5

## 2014-04-25 MED ORDER — SUCCINYLCHOLINE CHLORIDE 20 MG/ML IJ SOLN
INTRAMUSCULAR | Status: DC | PRN
  Administered 2014-04-25: 120 mg via INTRAVENOUS

## 2014-04-25 MED ORDER — PROPOFOL 10 MG/ML IV BOLUS
INTRAVENOUS | Status: DC | PRN
  Administered 2014-04-25: 160 mg via INTRAVENOUS

## 2014-04-25 MED ORDER — LACTATED RINGERS IV SOLN
INTRAVENOUS | Status: DC | PRN
  Administered 2014-04-25 (×2): via INTRAVENOUS

## 2014-04-25 MED ORDER — FENTANYL CITRATE 0.05 MG/ML IJ SOLN
INTRAMUSCULAR | Status: DC | PRN
  Administered 2014-04-25: 100 ug via INTRAVENOUS
  Administered 2014-04-25: 50 ug via INTRAVENOUS
  Administered 2014-04-25 (×2): 100 ug via INTRAVENOUS

## 2014-04-25 SURGICAL SUPPLY — 43 items
BAG DECANTER FOR FLEXI CONT (MISCELLANEOUS) ×2 IMPLANT
BRUSH SCRUB EZ PLAIN DRY (MISCELLANEOUS) IMPLANT
CANISTER SUCT 3000ML (MISCELLANEOUS) ×2 IMPLANT
CANISTER WOUND CARE 500ML ATS (WOUND CARE) ×2 IMPLANT
CONT SPEC 4OZ CLIKSEAL STRL BL (MISCELLANEOUS) IMPLANT
DRAPE LAPAROTOMY 100X72X124 (DRAPES) ×2 IMPLANT
DRAPE POUCH INSTRU U-SHP 10X18 (DRAPES) ×2 IMPLANT
DRSG VAC ATS MED SENSATRAC (GAUZE/BANDAGES/DRESSINGS) ×2 IMPLANT
ELECT REM PT RETURN 9FT ADLT (ELECTROSURGICAL) ×2
ELECTRODE REM PT RTRN 9FT ADLT (ELECTROSURGICAL) ×1 IMPLANT
GAUZE SPONGE 4X4 12PLY STRL (GAUZE/BANDAGES/DRESSINGS) IMPLANT
GAUZE SPONGE 4X4 16PLY XRAY LF (GAUZE/BANDAGES/DRESSINGS) IMPLANT
GLOVE BIO SURGEON STRL SZ7 (GLOVE) ×4 IMPLANT
GLOVE BIO SURGEON STRL SZ7.5 (GLOVE) ×2 IMPLANT
GLOVE BIOGEL PI IND STRL 8 (GLOVE) ×1 IMPLANT
GLOVE BIOGEL PI INDICATOR 8 (GLOVE) ×1
GLOVE ECLIPSE 7.5 STRL STRAW (GLOVE) ×2 IMPLANT
GLOVE EXAM NITRILE LRG STRL (GLOVE) IMPLANT
GLOVE EXAM NITRILE MD LF STRL (GLOVE) IMPLANT
GLOVE EXAM NITRILE XL STR (GLOVE) IMPLANT
GLOVE EXAM NITRILE XS STR PU (GLOVE) IMPLANT
GOWN STRL REUS W/ TWL LRG LVL3 (GOWN DISPOSABLE) ×1 IMPLANT
GOWN STRL REUS W/ TWL XL LVL3 (GOWN DISPOSABLE) IMPLANT
GOWN STRL REUS W/TWL 2XL LVL3 (GOWN DISPOSABLE) IMPLANT
GOWN STRL REUS W/TWL LRG LVL3 (GOWN DISPOSABLE) ×2
GOWN STRL REUS W/TWL XL LVL3 (GOWN DISPOSABLE)
KIT BASIN OR (CUSTOM PROCEDURE TRAY) ×2 IMPLANT
KIT ROOM TURNOVER OR (KITS) ×2 IMPLANT
NEEDLE SPNL 22GX3.5 QUINCKE BK (NEEDLE) ×2 IMPLANT
NS IRRIG 1000ML POUR BTL (IV SOLUTION) ×2 IMPLANT
PACK LAMINECTOMY NEURO (CUSTOM PROCEDURE TRAY) ×2 IMPLANT
PAD ARMBOARD 7.5X6 YLW CONV (MISCELLANEOUS) ×8 IMPLANT
SPECIMEN JAR SMALL (MISCELLANEOUS) ×4 IMPLANT
SPONGE SURGIFOAM ABS GEL SZ50 (HEMOSTASIS) IMPLANT
SUT VIC AB 1 CT1 18XBRD ANBCTR (SUTURE) ×1 IMPLANT
SUT VIC AB 1 CT1 8-18 (SUTURE) ×2
SUT VIC AB 2-0 CP2 18 (SUTURE) ×2 IMPLANT
SWAB CULTURE LIQ STUART DBL (MISCELLANEOUS) IMPLANT
SYR 20ML ECCENTRIC (SYRINGE) ×2 IMPLANT
TOWEL OR 17X24 6PK STRL BLUE (TOWEL DISPOSABLE) ×2 IMPLANT
TOWEL OR 17X26 10 PK STRL BLUE (TOWEL DISPOSABLE) ×2 IMPLANT
TUBE ANAEROBIC SPECIMEN COL (MISCELLANEOUS) ×4 IMPLANT
WATER STERILE IRR 1000ML POUR (IV SOLUTION) ×2 IMPLANT

## 2014-04-25 NOTE — Progress Notes (Addendum)
ANTIBIOTIC CONSULT NOTE - INITIAL  Pharmacy Consult for vancomycin, gentamicin Indication: surgical prophylaxis  Allergies  Allergen Reactions  . Clindamycin/Lincomycin Rash  . Influenza Vaccines Hives  . Lodine [Etodolac] Rash    Patient Measurements:   Adjusted Body Weight:   Vital Signs: Temp: 98.3 F (36.8 C) (11/03 1518) Temp Source: Oral (11/03 1518) BP: 131/62 mmHg (11/03 1518) Pulse Rate: 104 (11/03 1518) Intake/Output from previous day:   Intake/Output from this shift: Total I/O In: 500 [I.V.:500] Out: -   Labs:  Recent Labs  04/24/14 2128  WBC 8.2  HGB 11.8*  PLT 489*  CREATININE 0.65   CrCl cannot be calculated (Unknown ideal weight.). No results for input(s): VANCOTROUGH, VANCOPEAK, VANCORANDOM, GENTTROUGH, GENTPEAK, GENTRANDOM, TOBRATROUGH, TOBRAPEAK, TOBRARND, AMIKACINPEAK, AMIKACINTROU, AMIKACIN in the last 72 hours.   Microbiology: No results found for this or any previous visit (from the past 720 hour(s)).  Medical History: Past Medical History  Diagnosis Date  . Complication of anesthesia     during surgery she could hear staff talking ans could feel them preping her arm over 30 yrs. ago  . Hypertension   . Hypothyroidism   . Anxiety   . GERD (gastroesophageal reflux disease)   . Arthritis   . Fibromyalgia   . Restless leg syndrome     Medications:  See EMR  Assessment: 58 yo female admitted for lumbar would drainage s/p lumbar decompression a few weeks ago. Pt is going to OR tonight debridement and drainage.  Renal fx stable, hgb mildly low 11.8, plts stable.   Goal of Therapy:  Vancomycin trough level 15-20 mcg/ml Gentamicin trough level <2 mcg/ml  Plan:  -Vancomycin 1 g IV x1 already given in OR, will give another 1g NOW to complete load then continue with 1g/12h -Gentamicin 80 mg IV x1 then 550 mg IV q24h per extended interval dosing -F/u ALL cultures -F/u duration of therapy -Monitor renal fx   Agapito Games, PharmD,  BCPS Clinical Pharmacist Pager: [phone] 04/25/2014 9:03 PM

## 2014-04-25 NOTE — Anesthesia Procedure Notes (Signed)
Procedure Name: Intubation Date/Time: 04/25/2014 8:43 PM Performed by: Brien Mates D Pre-anesthesia Checklist: Patient identified, Emergency Drugs available, Suction available, Patient being monitored and Timeout performed Patient Re-evaluated:Patient Re-evaluated prior to inductionOxygen Delivery Method: Circle system utilized Preoxygenation: Pre-oxygenation with 100% oxygen Intubation Type: IV induction and Cricoid Pressure applied Ventilation: Mask ventilation without difficulty Laryngoscope Size: Miller and 2 Grade View: Grade I Tube type: Oral Tube size: 7.5 mm Number of attempts: 1 Airway Equipment and Method: Stylet Placement Confirmation: ETT inserted through vocal cords under direct vision,  positive ETCO2 and breath sounds checked- equal and bilateral Secured at: 22 cm Tube secured with: Tape Dental Injury: Teeth and Oropharynx as per pre-operative assessment

## 2014-04-25 NOTE — Progress Notes (Signed)
Utilization review completed.  

## 2014-04-25 NOTE — Transfer of Care (Signed)
Immediate Anesthesia Transfer of Care Note  Patient: [name]  Procedure(s) Performed: Procedure(s): LUMBAR WOUND DEBRIDEMENT Placement of Woundvac) (N/A) APPLICATION OF WOUND VAC (N/A)  Patient Location: PACU  Anesthesia Type:General  Level of Consciousness: awake, alert  and oriented  Airway & Oxygen Therapy: Patient Spontanous Breathing and Patient connected to nasal cannula oxygen  Post-op Assessment: Report given to PACU RN and Post -op Vital signs reviewed and stable  Post vital signs: Reviewed and stable  Complications: No apparent anesthesia complications

## 2014-04-25 NOTE — Progress Notes (Signed)
Subjective: Patient resting fairly comfortably in bed. Laboratories show a C-reactive protein of 4.4 and sedimentation rate of 60. Normal white blood cell count of 8.2. MRI interpretation by radiologist is of probable wound seroma, although he cannot rule out infection.  Objective: Vital signs in last 24 hours: Filed Vitals:   04/24/14 1800 04/24/14 2001 04/25/14 0600  BP: 128/56 149/81 128/75  Pulse: 115 114 103  Temp: 98.7 F (37.1 C) 98.6 F (37 C) 99 F (37.2 C)  TempSrc:  Axillary   Resp: 16    SpO2: 100% 100% 96%    Intake/Output from previous day:   Intake/Output this shift:    Physical Exam:  Moving all extremities well.  CBC  Recent Labs  04/24/14 2128  WBC 8.2  HGB 11.8*  HCT 34.3*  PLT 489*   BMET  Recent Labs  04/24/14 2128  NA 137  K 3.6*  CL 98  CO2 24  GLUCOSE 129*  BUN 11  CREATININE 0.65  CALCIUM 10.1    Studies/Results: Mr Lumbar Spine W Wo Contrast  04/24/2014   CLINICAL DATA:  Initial evaluation for wound drainage. Recent spinal surgery.  EXAM: MRI LUMBAR SPINE WITHOUT AND WITH CONTRAST  TECHNIQUE: Multiplanar and multiecho pulse sequences of the lumbar spine were obtained without and with intravenous contrast.  CONTRAST:  44mL MULTIHANCE GADOBENATE DIMEGLUMINE 529 MG/ML IV SOLN  COMPARISON:  Prior radiograph from earlier the same day as well as previous MRI from 02/28/2014.  FINDINGS: For the purposes of this dictation, the lowest well-formed intervertebral disc spaces presumed to be the L5-S1 level, and there presumed to be 5 lumbar type vertebral bodies.  Conus medullaris terminates normally. Signal intensity within the visualized cord is unremarkable.  Postoperative changes from recent posterior spinal decompression are seen. Specifically, there has been performance of bilateral hemi laminectomies at L3, L4, and L5, with bilateral L3-4 and L4-5 facetectomy. Bilateral transpedicular screws in place at L3, L4, L5, and S1. Interbody arthrodesis  in place at L3-4, L4-5, and L5-S1.  A T1 hypointense, T2 hyperintense collection measuring 3.5 (AP) x 5.7 (craniocaudad) x 7.4 (transverse) cm is seen at the laminectomy bed at the level of L3 through L5 (Series 401, image 9). This approximates the dorsal aspect of the thecal sac without significant mass effect. There is mild post-contrast enhancement peripherally on post-contrast sequences. Mild postcontrast enhancement seen within the epidural space spanning L3 through S1, likely postoperative in nature. No epidural abscess.  A second collection seen more superficially along the midline incisional wound measures 2.5 (AP) x 2.2 (transverse) x 10.8 (craniocaudad). This collection contains gas within the nondependent portion in appears to communicate with the overlying skin (series 7, image 27). This collection may communicate with the deeper collection at the level of S1 where fluid signal intensity is seen tracking posteriorly along the left S1 lamina towards the left L5-S1 facet (series 7, image 33). There is peripheral enhancement about this collection as well. Edema with postcontrast enhancement within the paraspinous musculature compatible with recent surgery.  T10-11: Seen only on sagittal projection. Facet and ligamentous hypertrophy. Degenerative disc bulge without definite focal disc protrusion. No evidence of cord compression.  T11-12: Mild disc bulge without focal disc protrusion. Mild ligamentous hypertrophy. No canal or foraminal stenosis.  T12-L1: Minimal disc bulge without protrusion. No canal or foraminal stenosis.  L1-2: Diffuse disc bulge with disc desiccation. Mild facet and ligamentous flavum hypertrophy. There is mild canal and lateral recess stenosis, similar to prior. Mild bilateral foraminal  narrowing also stable.  L2-3: 3 mm of retrolisthesis is stable. Degenerative intervertebral disc space narrowing with disc bulge and desiccation is present. Moderate facet and ligamentous flavum  hypertrophy. There is resultant moderate to severe canal and lateral recess stenosis, grossly stable from prior. Moderate bilateral foraminal narrowing, right worse than left also stable.  L3-4: Status post decompression, bilateral facetectomy, discectomy, and fusion. Transpedicular screws appear well positioned. No canal stenosis. 2 mm of retrolisthesis is stable. Bilateral neural foramina grossly patent, although poorly evaluated due to susceptibility artifact.  L4-5: Status post decompression, bilateral facetectomy, discectomy, and fusion. Central canal widely patent. Neural foramina appear widely patent.  L5-S1: Stable sequelae of prior decompression, discectomy, and fusion.  IMPRESSION: 1. Postoperative changes from recent posterior decompression, fusion, and discectomy at L3-4 and L4-5. 2. 3.5 x 5.7 x 7.4 cm collection within the surgical bed at L3 through L4-5, favored to reflect postoperative seroma, although superimposed infection could be considered in the correct clinical setting. 3. Second 2.5 x 2.2 x 10.8 cm gas and fluid collection more superficially along the midline incisional wound with open drainage to the skin and possible communication with the deeper collection at the surgical bed as above. Again, possible infection/abscess could be considered in the correct clinical setting. 4. Stable sequelae of decompression and fusion at L5-S1. 5. Multilevel degenerative disc disease as above, with improved stenosis at L3-4 and L4-5 status post decompression. Degenerative disc disease and stenosis at L2-3 is similar to prior.   Electronically Signed   By: Rise Mu M.D.   On: 04/24/2014 22:05    Assessment/Plan: Has spoken with the patient and her husband regarding the laboratory and MRI findings. I favor wound debridement for treatment of wound drainage, and placement of a wound VAC, particularly because of the extensive layer of subcutaneous adipose tissue. We'll schedule surgery for this  evening, and have requested nothing by mouth after 1230 and nursing staff to obtain consent.   [name], MD 04/25/2014, 8:40 AM

## 2014-04-25 NOTE — Op Note (Signed)
04/24/2014 - 04/25/2014  9:31 PM  PATIENT:  [name]  58 y.o. female  PRE-OPERATIVE DIAGNOSIS:  Wound Drainage  POST-OPERATIVE DIAGNOSIS:   Suprafascial probable wound infection, infrafascial probable wound seroma  PROCEDURE:  Procedure(s):  LUMBAR WOUND DEBRIDEMENT and Placement of Wound VAC  SURGEON:  Surgeon(s): [name], MD  ANESTHESIA:   general  EBL:  Total I/O In: 1000 [I.V.:1000] Out: -   BLOOD ADMINISTERED:none  COUNT:  correct per nursing staff  DRAINS: lumbar wound VAC   SPECIMEN:  Source of Specimen:  1) wound swabs for aerobic and anaerobic cultures, and gram stain  2) tissue from wall of wound for culture  DICTATION:  Patient was brought to the operating room, placed under general endotracheal anesthesia. Patient was turned to a prone position, and the lumbar region was prepped with Betadine soap and solution, and draped in a sterile fashion.  There was a small opening at the inferior aspect of her incision, approximately 1 cm in length, and the wound was opened rostral to that, dissection was carried down into the subcutaneous tissue. We immediately encountered cavity with what appeared to be purulent fluid, and aerobic and anaerobic swab cultures were obtained, and gram stains requested as well.  Tissue from the walls of the subcutaneous cavity were obtained for culture as well. The fascia was then opened inferiorly and we entered a cavity filled with clear yellow fluid, consistent with a wound seroma. The overall impression is of a suprafascial wound infection, and a infrafascial wound seroma. Once the cultures had been obtained, the patient was given vancomycin and gentamicin by the anesthesia service.  The wound was irrigated with a liter of saline, and then subsequently with a liter of bacitracin solution.  A medium-sized wound VAC was then opened, cut to the wound size, and placed within the wound. The sequential layers of the dressing were applied, the  opening was cut in the plastic and then the adhesive collection system was placed over that. The system was then connected to the collection canister, that was then passed off and connected to the vacuums system. Settings were placed, and then the system was turned on, a good seal was found.  Following surgery the patient is to be turned back to supine position, reversed the anesthetic, extubated, and transferred to the recovery room.  PLAN OF CARE: Admit to inpatient   PATIENT DISPOSITION:  PACU - hemodynamically stable.   Delay start of Pharmacological VTE agent (>24hrs) due to surgical blood loss or risk of bleeding:  yes

## 2014-04-25 NOTE — Anesthesia Preprocedure Evaluation (Signed)
Anesthesia Evaluation  Patient identified by MRN, date of birth, ID band Patient awake    Reviewed: Allergy & Precautions, H&P , NPO status , Patient's Chart, lab work & pertinent test results  Airway Mallampati: I  TM Distance: >3 FB Neck ROM: Full    Dental   Pulmonary          Cardiovascular hypertension, Pt. on medications     Neuro/Psych    GI/Hepatic GERD-  Controlled and Medicated,  Endo/Other    Renal/GU      Musculoskeletal   Abdominal   Peds  Hematology   Anesthesia Other Findings   Reproductive/Obstetrics                             Anesthesia Physical Anesthesia Plan  ASA: II and emergent  Anesthesia Plan: General   Post-op Pain Management:    Induction: Intravenous  Airway Management Planned: Oral ETT  Additional Equipment:   Intra-op Plan:   Post-operative Plan: Extubation in OR  Informed Consent: I have reviewed the patients History and Physical, chart, labs and discussed the procedure including the risks, benefits and alternatives for the proposed anesthesia with the patient or authorized representative who has indicated his/her understanding and acceptance.     Plan Discussed with: CRNA and Surgeon  Anesthesia Plan Comments:         Anesthesia Quick Evaluation

## 2014-04-26 ENCOUNTER — Encounter (HOSPITAL_COMMUNITY): Payer: Self-pay | Admitting: Neurosurgery

## 2014-04-26 MED ORDER — LOSARTAN POTASSIUM 50 MG PO TABS
100.0000 mg | ORAL_TABLET | Freq: Every day | ORAL | Status: DC
  Administered 2014-04-28 – 2014-04-29 (×2): 100 mg via ORAL
  Filled 2014-04-26 (×3): qty 2

## 2014-04-26 MED ORDER — PANTOPRAZOLE SODIUM 40 MG PO TBEC
40.0000 mg | DELAYED_RELEASE_TABLET | Freq: Every day | ORAL | Status: DC
  Administered 2014-04-26 – 2014-04-29 (×4): 40 mg via ORAL
  Filled 2014-04-26 (×4): qty 1

## 2014-04-26 MED ORDER — DIPHENHYDRAMINE HCL 50 MG/ML IJ SOLN
25.0000 mg | Freq: Four times a day (QID) | INTRAMUSCULAR | Status: DC | PRN
  Administered 2014-04-26 – 2014-04-27 (×5): 25 mg via INTRAVENOUS
  Filled 2014-04-26 (×5): qty 1

## 2014-04-26 MED ORDER — LEVOTHYROXINE SODIUM 200 MCG PO TABS
200.0000 ug | ORAL_TABLET | Freq: Every day | ORAL | Status: DC
  Administered 2014-04-27 – 2014-04-29 (×3): 200 ug via ORAL
  Filled 2014-04-26 (×4): qty 1

## 2014-04-26 MED ORDER — OXYCODONE HCL 5 MG PO TABS
5.0000 mg | ORAL_TABLET | ORAL | Status: DC | PRN
  Administered 2014-04-26 – 2014-04-29 (×13): 10 mg via ORAL
  Filled 2014-04-26 (×13): qty 2

## 2014-04-26 MED ORDER — HYDROCHLOROTHIAZIDE 12.5 MG PO CAPS
12.5000 mg | ORAL_CAPSULE | Freq: Every day | ORAL | Status: DC
  Filled 2014-04-26: qty 1

## 2014-04-26 MED ORDER — SODIUM CHLORIDE 0.9 % IJ SOLN
10.0000 mL | INTRAMUSCULAR | Status: DC | PRN
  Administered 2014-04-28 – 2014-04-29 (×3): 10 mL
  Filled 2014-04-26 (×3): qty 40

## 2014-04-26 NOTE — Plan of Care (Signed)
Problem: Phase I Progression Outcomes Goal: OOB as tolerated unless otherwise ordered Outcome: Completed/Met Date Met:  04/26/14 Goal: Incision/dressings dry and intact Outcome: Completed/Met Date Met:  04/26/14 Goal: Sutures/staples intact Outcome: Completed/Met Date Met:  04/26/14 Goal: Initial discharge plan identified Outcome: Completed/Met Date Met:  04/26/14 Goal: Voiding-avoid urinary catheter unless indicated Outcome: Completed/Met Date Met:  04/26/14 Goal: Vital signs/hemodynamically stable Outcome: Completed/Met Date Met:  04/26/14

## 2014-04-26 NOTE — Progress Notes (Signed)
Subjective: Patient resting in bed, has been up and about in the room, but has not ambulated in the halls.  Gram stain shows gram-positive cocci in pairs and clusters. Cultures pending.  PICC line placed. Continues on vancomycin and gentamicin.  Objective: Vital signs in last 24 hours: Filed Vitals:   04/25/14 2322 04/26/14 0046 04/26/14 0518 04/26/14 1300  BP: 127/66 123/64 103/57 137/83  Pulse: 91 84 78 97  Temp: 97.5 F (36.4 C) 97.5 F (36.4 C) 97.7 F (36.5 C) 97.6 F (36.4 C)  TempSrc: Oral Oral Oral   Resp: 16 17 18 18   SpO2: 98% 100% 100% 100%    Intake/Output from previous day: 11/03 0701 - 11/04 0700 In: 2350 [P.O.:900; I.V.:1250; IV Piggyback:200] Out: 125 [Drains:50; Blood:75] Intake/Output this shift: Total I/O In: 480 [P.O.:480] Out: -   Physical Exam:  Wound VAC functioning well. Dressing intact. All extremities well.  CBC  Recent Labs  04/24/14 2128  WBC 8.2  HGB 11.8*  HCT 34.3*  PLT 489*   BMET  Recent Labs  04/24/14 2128  NA 137  K 3.6*  CL 98  CO2 24  GLUCOSE 129*  BUN 11  CREATININE 0.65  CALCIUM 10.1    Assessment/Plan: Doing well following wound debridement last night. I spoke with the patient and her nurse about having the patient ambulate in the halls at least 3-4 times a day. Have consulted infectious disease, and spoken with Dr. 2129 dam who has recommended that we continue the vancomycin, but stop the gentamicin, and he will see her in consultation. Final antibiotic recommendations though we'll certainly await her culture results.   Zenaida Niece, MD 04/26/2014, 4:23 PM

## 2014-04-26 NOTE — Clinical Documentation Improvement (Deleted)
  Attending Provider,  CXRs are no longer routinely done following the placement of PICC lines. PICC lines are now placed with ECG guidance.  ICD 10 requires provider documentation of the anatomical location of the tip of the catheter.   The patient had a PICC line placed 04/26/14 from the right basilic vein.    Please document the location of the tip of the PICC line catheter in the progress notes and discharge summary:   - Right Atrium  - Inferior Vena Cava  - Innominate Vein  - Jugular  - Superior Vena Cava  - Subclavian Vein  - Other Location  Thank You, Jerral Ralph ,RN Clinical Documentation Specialist:  [phone] [facility]- Health Information Management

## 2014-04-26 NOTE — Anesthesia Postprocedure Evaluation (Signed)
Anesthesia Post Note  Patient: [name]  Procedure(s) Performed: Procedure(s) (LRB): LUMBAR WOUND DEBRIDEMENT Placement of Woundvac) (N/A) APPLICATION OF WOUND VAC (N/A)  Anesthesia type: general  Patient location: PACU  Post pain: Pain level controlled  Post assessment: Patient's Cardiovascular Status Stable  Last Vitals:  Filed Vitals:   04/26/14 0046  BP: 123/64  Pulse: 84  Temp: 36.4 C  Resp: 17    Post vital signs: Reviewed and stable  Level of consciousness: sedated  Complications: No apparent anesthesia complications

## 2014-04-26 NOTE — Progress Notes (Signed)
Peripherally Inserted Central Catheter/Midline Placement  The IV Nurse has discussed with the patient and/or persons authorized to consent for the patient, the purpose of this procedure and the potential benefits and risks involved with this procedure.  The benefits include less needle sticks, lab draws from the catheter and patient may be discharged home with the catheter.  Risks include, but not limited to, infection, bleeding, blood clot (thrombus formation), and puncture of an artery; nerve damage and irregular heat beat.  Alternatives to this procedure were also discussed.  PICC/Midline Placement Documentation        [name] 04/26/2014, 3:23 PM

## 2014-04-27 DIAGNOSIS — T8189XA Other complications of procedures, not elsewhere classified, initial encounter: Secondary | ICD-10-CM | POA: Insufficient documentation

## 2014-04-27 DIAGNOSIS — Z113 Encounter for screening for infections with a predominantly sexual mode of transmission: Secondary | ICD-10-CM | POA: Insufficient documentation

## 2014-04-27 DIAGNOSIS — T148XXA Other injury of unspecified body region, initial encounter: Secondary | ICD-10-CM | POA: Insufficient documentation

## 2014-04-27 DIAGNOSIS — L24A9 Irritant contact dermatitis due friction or contact with other specified body fluids: Secondary | ICD-10-CM | POA: Insufficient documentation

## 2014-04-27 DIAGNOSIS — A4901 Methicillin susceptible Staphylococcus aureus infection, unspecified site: Secondary | ICD-10-CM

## 2014-04-27 DIAGNOSIS — T814XXA Infection following a procedure, initial encounter: Principal | ICD-10-CM

## 2014-04-27 LAB — CBC
HCT: 29.8 % — ABNORMAL LOW (ref 36.0–46.0)
Hemoglobin: 10 g/dL — ABNORMAL LOW (ref 12.0–15.0)
MCH: 28.7 pg (ref 26.0–34.0)
MCHC: 33.6 g/dL (ref 30.0–36.0)
MCV: 85.6 fL (ref 78.0–100.0)
Platelets: 366 10*3/uL (ref 150–400)
RBC: 3.48 MIL/uL — ABNORMAL LOW (ref 3.87–5.11)
RDW: 13.5 % (ref 11.5–15.5)
WBC: 7.7 10*3/uL (ref 4.0–10.5)

## 2014-04-27 LAB — BASIC METABOLIC PANEL
Anion gap: 13 (ref 5–15)
BUN: 5 mg/dL — ABNORMAL LOW (ref 6–23)
CO2: 27 mEq/L (ref 19–32)
Calcium: 9.2 mg/dL (ref 8.4–10.5)
Chloride: 103 mEq/L (ref 96–112)
Creatinine, Ser: 0.67 mg/dL (ref 0.50–1.10)
GFR calc Af Amer: >90 mL/min (ref >90)
GFR calc non Af Amer: >90 mL/min (ref >90)
Glucose, Bld: 92 mg/dL (ref 70–99)
Potassium: 3.7 mEq/L (ref 3.7–5.3)
Sodium: 143 mEq/L (ref 137–147)

## 2014-04-27 MED ORDER — FLUCONAZOLE 150 MG PO TABS
150.0000 mg | ORAL_TABLET | Freq: Every day | ORAL | Status: DC
  Administered 2014-04-27: 150 mg via ORAL
  Filled 2014-04-27 (×2): qty 1

## 2014-04-27 MED ORDER — CEFAZOLIN SODIUM-DEXTROSE 2-3 GM-% IV SOLR
2.0000 g | Freq: Three times a day (TID) | INTRAVENOUS | Status: DC
  Administered 2014-04-27 – 2014-04-28 (×4): 2 g via INTRAVENOUS
  Filled 2014-04-27 (×8): qty 50

## 2014-04-27 MED ORDER — KETOROLAC TROMETHAMINE 30 MG/ML IJ SOLN
30.0000 mg | Freq: Four times a day (QID) | INTRAMUSCULAR | Status: DC
  Administered 2014-04-27 – 2014-04-29 (×8): 30 mg via INTRAVENOUS
  Filled 2014-04-27 (×10): qty 1

## 2014-04-27 NOTE — Progress Notes (Signed)
Subjective: Patient up and ambulating in the halls, wearing Aspen lumbar brace, using rolling walker.  Underwent first wound VAC change today. Has had a lot more pain since undergoing surgery, and placement of wound VAC. Using a lot of narcotics for pain management, and having moderate difficulty with itching.  Cultures so far show staph aureus, methicillin sensitivity pending. Seen in infectious disease consultation by Dr. Daiva Eves, who is added Ancef pending final organism identification.  Objective: Vital signs in last 24 hours: Filed Vitals:   04/26/14 1300 04/26/14 2028 04/27/14 0527 04/27/14 1300  BP: 137/83 120/64 111/62 161/83  Pulse: 97 98 95 99  Temp: 97.6 F (36.4 C) 98 F (36.7 C) 98.4 F (36.9 C) 98.1 F (36.7 C)  TempSrc:  Oral Oral   Resp: 18   16  SpO2: 100% 100% 100% 100%    Intake/Output from previous day: 11/04 0701 - 11/05 0700 In: 5368.3 [P.O.:720; I.V.:4448.3; IV Piggyback:200] Out: 100 [Drains:100] Intake/Output this shift:    Physical Exam:  Good strength.  CBC  Recent Labs  04/24/14 2128 04/27/14 0500  WBC 8.2 7.7  HGB 11.8* 10.0*  HCT 34.3* 29.8*  PLT 489* 366   BMET  Recent Labs  04/24/14 2128 04/27/14 0500  NA 137 143  K 3.6* 3.7  CL 98 103  CO2 24 27  GLUCOSE 129* 92  BUN 11 5*  CREATININE 0.65 0.67  CALCIUM 10.1 9.2    Assessment/Plan: Progressing through initial recovery. Will add Toradol IV to try to help with pain management, and to allow for less narcotic use. Encouraged to ambulate in the halls.   [name], MD 04/27/2014, 7:30 PM

## 2014-04-27 NOTE — Care Management Note (Signed)
CARE MANAGEMENT NOTE 04/27/2014  Patient:  [name], [name]   Account Number:  [id number]  Date Initiated:  04/24/2014  Documentation initiated by:  Vance Peper  Subjective/Objective Assessment:   58 yr old female admitted with lumbar wound infection. Patient had a wound I & D with wound vac placement.     Action/Plan:   Case manager spoke with patient concerning discharge home health needs. Wound Vac and Home Health RN for PICC care and IV antibiotics. Referral called to [facility] , [facility] liaison. Patient has family support at discharge.   Anticipated DC Date:  04/29/2014   Anticipated DC Plan:  HOME W HOME HEALTH SERVICES      DC Planning Services  CM consult      [facility] Choice  HOME HEALTH   Choice offered to / List presented to:  [name]   DME arranged  [facility]      DME agency  [facility]     HH arranged  HH-1 RN      [facility] agency  [facility]   Status of service:  In process, will continue to follow Medicare Important Message given?   (If response is "NO", the following Medicare IM given date fields will be blank) Date Medicare IM given:   Medicare IM given by:   Date Additional Medicare IM given:   Additional Medicare IM given by:    Discharge Disposition:    Per UR Regulation:  Reviewed for med. necessity/level of care/duration of stay

## 2014-04-27 NOTE — Plan of Care (Signed)
Problem: Phase III Progression Outcomes Goal: Activity at appropriate level-compared to baseline (UP IN CHAIR FOR HEMODIALYSIS)  Outcome: Completed/Met Date Met:  04/27/14 Goal: Discharge plan remains appropriate-arrangements made Outcome: Completed/Met Date Met:  04/27/14 Goal: Demonstrates TCDB, IS independently Outcome: Completed/Met Date Met:  04/27/14

## 2014-04-27 NOTE — Plan of Care (Signed)
Problem: Discharge Progression Outcomes Goal: Activity appropriate for discharge plan Outcome: Completed/Met Date Met:  04/27/14     

## 2014-04-27 NOTE — Plan of Care (Signed)
Problem: Phase III Progression Outcomes Goal: Voiding independently Outcome: Completed/Met Date Met:  04/27/14

## 2014-04-27 NOTE — Consult Note (Addendum)
[facility]    Date of Admission:  04/24/2014  Date of Consult:  04/27/2014  Reason for Consult: postoperative staphylococcus aureus infection Referring Physician: Dr. Sherwood Gambler   HPI: [name] is an 58 y.o. female who had  a lumbar decompression and arthrodesis 3-1/2 weeks ago. She developed fever and chills 4 days ago prior to admission and wound drainage 3 days ago. She was started 4 days ago on doxycycline 100 mg twice a day by Dr. Saintclair Halsted.  She came into Neurosurgery office 04/24/14 today for evaluation. She was admitted and MRI showed:  IMPRESSION: 1. Postoperative changes from recent posterior decompression, fusion, and discectomy at L3-4 and L4-5. 2. 3.5 x 5.7 x 7.4 cm collection within the surgical bed at L3 through L4-5, favored to reflect postoperative seroma, although superimposed infection could be considered in the correct clinical setting. 3. Second 2.5 x 2.2 x 10.8 cm gas and fluid collection more superficially along the midline incisional wound with open drainage to the skin and possible communication with the deeper collection at the surgical bed as above. Again, possible infection/abscess could be considered in the correct clinical setting. 4. Stable sequelae of decompression and fusion at L5-S1. 5. Multilevel degenerative disc disease as above, with improved stenosis at L3-4 and L4-5 status post decompression. Degenerative disc disease and stenosis at L2-3 is similar to prior.   Dr Sherwood Gambler took pt to the OR and performed LUMBAR WOUND DEBRIDEMENT and Placement of Wound VAC on 04/25/14. He encountered frank purulence superficially and sent this and tissue for culture (which is growing SA). He then debrided deeper into the fascia and found clear fluid consistent in appearance with seroma   She has been on vancomycin and gentamicin originally narrowed to vancomycin today. I have added cefazolin for better bactericidal activity in  case this is MSSA   Past Medical History  Diagnosis Date  . Complication of anesthesia     during surgery she could hear staff talking ans could feel them preping her arm over 30 yrs. ago  . Hypertension   . Hypothyroidism   . Anxiety   . GERD (gastroesophageal reflux disease)   . Arthritis   . Fibromyalgia   . Restless leg syndrome     Past Surgical History  Procedure Laterality Date  . Abdominal hysterectomy  1981    partial  . Cervical fusion  1990  . Cervical fusion      2000  . Bladder suspension      2004  . Back surgery  2007  . Foot surgery Right 2009    reconstruction with hardware  . Foot surgery Left 2013    reconstruction with hardware  . Cyst excision Left 1985    wrist  . Cyst excision  1985    chest  . Lumbar wound debridement N/A 04/25/2014    Procedure: LUMBAR WOUND DEBRIDEMENT Placement of Woundvac);  Surgeon: Hosie Spangle, MD;  Location: [facility];  Service: Neurosurgery;  Laterality: N/A;  . Application of wound vac N/A 04/25/2014    Procedure: APPLICATION OF WOUND VAC;  Surgeon: Hosie Spangle, MD;  Location: [facility];  Service: Neurosurgery;  Laterality: N/A;  ergies:   Allergies  Allergen Reactions  . Clindamycin/Lincomycin Rash  . Influenza Vaccines Hives  . Lodine [Etodolac] Rash     Medications: I have reviewed patients current medications as documented in Epic Anti-infectives    Start     Dose/Rate Route Frequency Ordered  Stop   04/27/14 0930  ceFAZolin (ANCEF) IVPB 2 g/50 mL premix     2 g100 mL/hr over 30 Minutes Intravenous 3 times per day 04/27/14 0844     04/26/14 2100  vancomycin (VANCOCIN) IVPB 1000 mg/200 mL premix  Status:  Discontinued     1,000 mg200 mL/hr over 60 Minutes Intravenous Every 12 hours 04/25/14 2118 04/25/14 2220   04/26/14 0900  vancomycin (VANCOCIN) 2,000 mg in sodium chloride 0.9 % 500 mL IVPB  Status:  Discontinued     2,000 mg250 mL/hr over 120 Minutes Intravenous  Once 04/25/14 2118 04/25/14  2219   04/26/14 0900  vancomycin (VANCOCIN) IVPB 1000 mg/200 mL premix     1,000 mg200 mL/hr over 60 Minutes Intravenous Every 12 hours 04/25/14 2220     04/26/14 0100  gentamicin (GARAMYCIN) 550 mg in dextrose 5 % 100 mL IVPB  Status:  Discontinued     550 mg113.8 mL/hr over 60 Minutes Intravenous Every 24 hours 04/25/14 2223 04/26/14 1630   04/25/14 2230  vancomycin (VANCOCIN) IVPB 1000 mg/200 mL premix     1,000 mg200 mL/hr over 60 Minutes Intravenous NOW 04/25/14 2218 04/26/14 0018   04/25/14 2100  gentamicin (GARAMYCIN) 160 mg in dextrose 5 % 100 mL IVPB  Status:  Discontinued     160 mg104 mL/hr over 60 Minutes Intravenous  Once 04/25/14 2052 04/25/14 2220   04/25/14 2030  vancomycin (VANCOCIN) 500 mg in sodium chloride 0.9 % 100 mL IVPB  Status:  Discontinued     500 mg100 mL/hr over 60 Minutes Intravenous Every 12 hours 04/25/14 2005 04/25/14 2044   04/25/14 2030  gentamicin (GARAMYCIN) IVPB 80 mg  Status:  Discontinued     80 mg100 mL/hr over 30 Minutes Intravenous  Once 04/25/14 2011 04/25/14 2051   04/25/14 2014  vancomycin (VANCOCIN) 1 GM/200ML IVPB    Comments:  [name], [name]   : cabinet override      04/25/14 2014 04/26/14 0829      Social History:  reports that she has never smoked. She does not have any smokeless tobacco history on file. She reports that she does not drink alcohol or use illicit drugs.  No family history on file.  As in HPI and primary teams notes otherwise 12 point review of systems is negative  Blood pressure 161/83, pulse 99, temperature 98.1 F (36.7 C), temperature source Oral, resp. rate 16, SpO2 100 %. General: Alert and awake, oriented x3, not in any acute distress. HEENT: anicteric sclera,  EOMI, oropharynx clear and without exudate CVS regular rate, normal r,  no murmur rubs or gallops Chest: clear to auscultation bilaterally, no wheezing, rales or rhonchi Abdomen: soft nontender, nondistended, normal bowel sounds, Extremities: no  clubbing  or edema noted bilaterally Skin:  I did not examine her wound today, PICC in place Neuro: nonfocal,   Results for orders placed or performed during the hospital encounter of 04/24/14 (from the past 48 hour(s))  Gram stain     Status: None   Collection Time: 04/25/14  9:04 PM  Result Value Ref Range   Specimen Description TISSUE BACK    Special Requests PATIENT ON FOLLOWING DOXYCYLINE SPECIMEN B NO 1    Gram Stain      RARE WBC PRESENT,BOTH PMN AND MONONUCLEAR NO ORGANISMS SEEN    Report Status 04/25/2014 FINAL   Tissue culture     Status: None (Preliminary result)   Collection Time: 04/25/14  9:04 PM  Result Value Ref Range  Specimen Description TISSUE BACK    Special Requests PATIENT ON FOLLOWING DOXYCYCLINE SPECIMEN B NO 1    Gram Stain PENDING    Culture      FEW STAPHYLOCOCCUS AUREUS Note: RIFAMPIN AND GENTAMICIN SHOULD NOT BE USED AS SINGLE DRUGS FOR TREATMENT OF STAPH INFECTIONS. Performed at [facility]    Report Status PENDING   Fungus Culture with Smear     Status: None (Preliminary result)   Collection Time: 04/25/14  9:04 PM  Result Value Ref Range   Specimen Description TISSUE BACK    Special Requests PATIENT ON FOLLOWING DOXYCYCLINE SPECIMEN B NO 1    Fungal Smear      NO YEAST OR FUNGAL ELEMENTS SEEN Performed at [facility]    Culture      CULTURE IN PROGRESS FOR FOUR WEEKS Performed at [facility]    Report Status PENDING   AFB culture with smear     Status: None (Preliminary result)   Collection Time: 04/25/14  9:04 PM  Result Value Ref Range   Specimen Description TISSUE BACK    Special Requests PATIENT ON FOLLOWING DOXYCYCLINE SPECIMEN B NO 1    Acid Fast Smear      NO ACID FAST BACILLI SEEN Performed at [facility]    Culture      CULTURE WILL BE EXAMINED FOR 6 WEEKS BEFORE ISSUING A FINAL REPORT Performed at [facility]    Report Status PENDING   Anaerobic culture     Status: None (Preliminary  result)   Collection Time: 04/25/14  9:04 PM  Result Value Ref Range   Specimen Description TISSUE BACK    Special Requests PATIENT ON FOLLOWING DOXYCYCLINE SPECIMEN B NO 1    Gram Stain      RARE WBC PRESENT,BOTH PMN AND MONONUCLEAR NO ORGANISMS SEEN Performed at [facility] Performed at [place]; CULTURE IN PROGRESS FOR 5 DAYS Performed at [facility]    Report Status PENDING   Gram stain     Status: None   Collection Time: 04/25/14  9:04 PM  Result Value Ref Range   Specimen Description TISSUE BACK    Special Requests PATIENT ON FOLLOWING DOXYCYCLINE SPECIMEN B NO 2    Gram Stain      FEW WBC PRESENT,BOTH PMN AND MONONUCLEAR NO ORGANISMS SEEN    Report Status 04/25/2014 FINAL   Tissue culture     Status: None (Preliminary result)   Collection Time: 04/25/14  9:04 PM  Result Value Ref Range   Specimen Description TISSUE BACK    Special Requests PATIENT ON FOLLOWING DOXYCYCLINE SPECIMEN B NO 2    Gram Stain      FEW WBC PRESENT,BOTH PMN AND MONONUCLEAR NO ORGANISMS SEEN Performed at [facility] Performed at [place] Note: RIFAMPIN AND GENTAMICIN SHOULD NOT BE USED AS SINGLE DRUGS FOR TREATMENT OF STAPH INFECTIONS. Performed at [facility]    Report Status PENDING   Fungus Culture with Smear     Status: None (Preliminary result)   Collection Time: 04/25/14  9:04 PM  Result Value Ref Range   Specimen Description TISSUE BACK    Special Requests PATIENT ON FOLLOWING DOXYCYCLINE SPECIMEN B NO 2    Fungal Smear      NO YEAST OR FUNGAL ELEMENTS SEEN Performed  at [place] Performed at [facility]    Report Status PENDING   AFB culture with smear     Status: None (Preliminary result)   Collection Time: 04/25/14  9:04 PM  Result Value Ref Range   Specimen  Description TISSUE BACK    Special Requests PATIENT ON FOLLOWING DOXYCYCLINE SPECIMEN B NO 2    Acid Fast Smear      NO ACID FAST BACILLI SEEN Performed at [facility]    Culture      CULTURE WILL BE EXAMINED FOR 6 WEEKS BEFORE ISSUING A FINAL REPORT Performed at [facility]    Report Status PENDING   Anaerobic culture     Status: None (Preliminary result)   Collection Time: 04/25/14  9:04 PM  Result Value Ref Range   Specimen Description TISSUE BACK    Special Requests PATIENT ON FOLLOWING DOXYCYCLINE SPECIMEN B NO 2    Gram Stain      FEW WBC PRESENT,BOTH PMN AND MONONUCLEAR NO ORGANISMS SEEN Performed at [place]; CULTURE IN PROGRESS FOR 5 DAYS Performed at [facility]    Report Status PENDING   Wound culture     Status: None (Preliminary result)   Collection Time: 04/25/14  9:08 PM  Result Value Ref Range   Specimen Description WOUND BACK    Special Requests PATIENT ON FOLLOWING DOXYCYLINE SPECIMEN A NO 1    Gram Stain      ABUNDANT WBC PRESENT,BOTH PMN AND MONONUCLEAR NO SQUAMOUS EPITHELIAL CELLS SEEN MODERATE GRAM POSITIVE COCCI IN PAIRS IN CLUSTERS Performed at [facility] Performed at [facility]    Culture      MODERATE STAPHYLOCOCCUS AUREUS Note: RIFAMPIN AND GENTAMICIN SHOULD NOT BE USED AS SINGLE DRUGS FOR TREATMENT OF STAPH INFECTIONS. Performed at [facility]    Report Status PENDING   Gram stain     Status: None   Collection Time: 04/25/14  9:08 PM  Result Value Ref Range   Specimen Description WOUND BACK    Special Requests PATIENT ON FOLLOWING DOXYCYLINE SPECIMEN A NO 1    Gram Stain      ABUNDANT WBC PRESENT,BOTH PMN AND MONONUCLEAR MODERATE GRAM POSITIVE COCCI IN PAIRS IN CLUSTERS    Report Status 04/25/2014 FINAL   Anaerobic culture     Status: None (Preliminary result)   Collection Time: 04/25/14  9:08 PM  Result Value Ref Range    Specimen Description WOUND BACK    Special Requests PATIENT ON FOLLOWING DOXYCYLINE SPECIMEN A NO 1    Gram Stain      ABUNDANT WBC PRESENT,BOTH PMN AND MONONUCLEAR NO SQUAMOUS EPITHELIAL CELLS SEEN MODERATE GRAM POSITIVE COCCI IN PAIRS IN CLUSTERS Performed at [facility] Performed at [place]; CULTURE IN PROGRESS FOR 5 DAYS Performed at [facility]    Report Status PENDING   Wound culture     Status: None (Preliminary result)   Collection Time: 04/25/14  9:08 PM  Result Value Ref Range   Specimen Description WOUND BACK    Special Requests PATIENT ON FOLLOWING DOXYCYLINE SPECIMEN A NO 2    Gram Stain      ABUNDANT WBC PRESENT,BOTH PMN AND MONONUCLEAR NO SQUAMOUS EPITHELIAL CELLS SEEN  MODERATE GRAM POSITIVE COCCI IN PAIRS IN CLUSTERS Performed at [facility] Performed at [place]    Culture      ABUNDANT STAPHYLOCOCCUS AUREUS Note: RIFAMPIN AND GENTAMICIN SHOULD NOT BE USED AS SINGLE DRUGS FOR TREATMENT OF STAPH INFECTIONS. Performed at [facility]    Report Status PENDING   Anaerobic culture     Status: None (Preliminary result)   Collection Time: 04/25/14  9:08 PM  Result Value Ref Range   Specimen Description WOUND BACK    Special Requests PATIENT ON FOLLOWING DOXYCYLINE SPECIMEN A NO 2    Gram Stain      ABUNDANT WBC PRESENT,BOTH PMN AND MONONUCLEAR NO SQUAMOUS EPITHELIAL CELLS SEEN MODERATE GRAM POSITIVE COCCI IN PAIRS IN CLUSTERS Performed at [facility] Performed at [place]; CULTURE IN PROGRESS FOR 5 DAYS Performed at [facility]    Report Status PENDING   Gram stain     Status: None   Collection Time: 04/25/14  9:08 PM  Result Value Ref Range   Specimen Description WOUND BACK    Special Requests PATIENT ON FOLLOWING DOXYCYLINE SPECIMEN A NO 2    Gram Stain      ABUNDANT WBC PRESENT,BOTH PMN  AND MONONUCLEAR MODERATE GRAM POSITIVE COCCI IN PAIRS IN CLUSTERS    Report Status 04/25/2014 FINAL   Basic metabolic panel     Status: Abnormal   Collection Time: 04/27/14  5:00 AM  Result Value Ref Range   Sodium 143 137 - 147 mEq/L   Potassium 3.7 3.7 - 5.3 mEq/L   Chloride 103 96 - 112 mEq/L   CO2 27 19 - 32 mEq/L   Glucose, Bld 92 70 - 99 mg/dL   BUN 5 (L) 6 - 23 mg/dL   Creatinine, Ser 0.67 0.50 - 1.10 mg/dL   Calcium 9.2 8.4 - 10.5 mg/dL   GFR calc non Af Amer >90 >90 mL/min   GFR calc Af Amer >90 >90 mL/min    Comment: (NOTE) The eGFR has been calculated using the CKD EPI equation. This calculation has not been validated in all clinical situations. eGFR's persistently <90 mL/min signify possible Chronic Kidney Disease.    Anion gap 13 5 - 15  CBC     Status: Abnormal   Collection Time: 04/27/14  5:00 AM  Result Value Ref Range   WBC 7.7 4.0 - 10.5 K/uL   RBC 3.48 (L) 3.87 - 5.11 MIL/uL   Hemoglobin 10.0 (L) 12.0 - 15.0 g/dL   HCT 29.8 (L) 36.0 - 46.0 %   MCV 85.6 78.0 - 100.0 fL   MCH 28.7 26.0 - 34.0 pg   MCHC 33.6 30.0 - 36.0 g/dL   RDW 13.5 11.5 - 15.5 %   Platelets 366 150 - 400 K/uL   _0 (sdes,specrequest,cult,reptstatus)   ) Recent Results (from the past 720 hour(s))  Culture, blood (routine x 2)     Status: None (Preliminary result)   Collection Time: 04/24/14  9:28 PM  Result Value Ref Range Status   Specimen Description BLOOD LEFT ARM  Final   Special Requests BOTTLES DRAWN AEROBIC AND ANAEROBIC 5CC EACH  Final   Culture  Setup Time   Final    04/25/2014 08:59 Performed at [facility]    Culture   Final           BLOOD CULTURE RECEIVED NO GROWTH TO DATE CULTURE WILL  BE HELD FOR 5 DAYS BEFORE ISSUING A FINAL NEGATIVE REPORT Performed at [facility]    Report Status PENDING  Incomplete  Culture, blood (routine x 2)     Status: None (Preliminary result)   Collection Time: 04/24/14  9:37 PM  Result Value Ref Range  Status   Specimen Description BLOOD RIGHT ARM  Final   Special Requests BOTTLES DRAWN AEROBIC AND ANAEROBIC 5CC  Final   Culture  Setup Time   Final    04/25/2014 08:59 Performed at [facility]    Culture   Final           BLOOD CULTURE RECEIVED NO GROWTH TO DATE CULTURE WILL BE HELD FOR 5 DAYS BEFORE ISSUING A FINAL NEGATIVE REPORT Performed at [facility]    Report Status PENDING  Incomplete  Gram stain     Status: None   Collection Time: 04/25/14  9:04 PM  Result Value Ref Range Status   Specimen Description TISSUE BACK  Final   Special Requests PATIENT ON FOLLOWING DOXYCYLINE SPECIMEN B NO 1  Final   Gram Stain   Final    RARE WBC PRESENT,BOTH PMN AND MONONUCLEAR NO ORGANISMS SEEN    Report Status 04/25/2014 FINAL  Final  Tissue culture     Status: None (Preliminary result)   Collection Time: 04/25/14  9:04 PM  Result Value Ref Range Status   Specimen Description TISSUE BACK  Final   Special Requests PATIENT ON FOLLOWING DOXYCYCLINE SPECIMEN B NO 1  Final   Gram Stain PENDING  Incomplete   Culture   Final    FEW STAPHYLOCOCCUS AUREUS Note: RIFAMPIN AND GENTAMICIN SHOULD NOT BE USED AS SINGLE DRUGS FOR TREATMENT OF STAPH INFECTIONS. Performed at [facility]    Report Status PENDING  Incomplete  Fungus Culture with Smear     Status: None (Preliminary result)   Collection Time: 04/25/14  9:04 PM  Result Value Ref Range Status   Specimen Description TISSUE BACK  Final   Special Requests PATIENT ON FOLLOWING DOXYCYCLINE SPECIMEN B NO 1  Final   Fungal Smear   Final    NO YEAST OR FUNGAL ELEMENTS SEEN Performed at [facility]    Culture   Final    CULTURE IN PROGRESS FOR FOUR WEEKS Performed at [facility]    Report Status PENDING  Incomplete  AFB culture with smear     Status: None (Preliminary result)   Collection Time: 04/25/14  9:04 PM  Result Value Ref Range Status   Specimen Description TISSUE BACK  Final   Special  Requests PATIENT ON FOLLOWING DOXYCYCLINE SPECIMEN B NO 1  Final   Acid Fast Smear   Final    NO ACID FAST BACILLI SEEN Performed at [facility]    Culture   Final    CULTURE WILL BE EXAMINED FOR 6 WEEKS BEFORE ISSUING A FINAL REPORT Performed at [facility]    Report Status PENDING  Incomplete  Anaerobic culture     Status: None (Preliminary result)   Collection Time: 04/25/14  9:04 PM  Result Value Ref Range Status   Specimen Description TISSUE BACK  Final   Special Requests PATIENT ON FOLLOWING DOXYCYCLINE SPECIMEN B NO 1  Final   Gram Stain   Final    RARE WBC PRESENT,BOTH PMN AND MONONUCLEAR NO ORGANISMS SEEN Performed at [facility] Performed at [place]   Final  NO ANAEROBES ISOLATED; CULTURE IN PROGRESS FOR 5 DAYS Performed at [facility]    Report Status PENDING  Incomplete  Gram stain     Status: None   Collection Time: 04/25/14  9:04 PM  Result Value Ref Range Status   Specimen Description TISSUE BACK  Final   Special Requests PATIENT ON FOLLOWING DOXYCYCLINE SPECIMEN B NO 2  Final   Gram Stain   Final    FEW WBC PRESENT,BOTH PMN AND MONONUCLEAR NO ORGANISMS SEEN    Report Status 04/25/2014 FINAL  Final  Tissue culture     Status: None (Preliminary result)   Collection Time: 04/25/14  9:04 PM  Result Value Ref Range Status   Specimen Description TISSUE BACK  Final   Special Requests PATIENT ON FOLLOWING DOXYCYCLINE SPECIMEN B NO 2  Final   Gram Stain   Final    FEW WBC PRESENT,BOTH PMN AND MONONUCLEAR NO ORGANISMS SEEN Performed at [facility] Performed at [facility]    Culture   Final    FEW STAPHYLOCOCCUS AUREUS Note: RIFAMPIN AND GENTAMICIN SHOULD NOT BE USED AS SINGLE DRUGS FOR TREATMENT OF STAPH INFECTIONS. Performed at [facility]    Report Status PENDING  Incomplete  Fungus Culture with Smear     Status: None (Preliminary result)   Collection Time: 04/25/14   9:04 PM  Result Value Ref Range Status   Specimen Description TISSUE BACK  Final   Special Requests PATIENT ON FOLLOWING DOXYCYCLINE SPECIMEN B NO 2  Final   Fungal Smear   Final    NO YEAST OR FUNGAL ELEMENTS SEEN Performed at [facility]    Culture   Final    CULTURE IN PROGRESS FOR FOUR WEEKS Performed at [facility]    Report Status PENDING  Incomplete  AFB culture with smear     Status: None (Preliminary result)   Collection Time: 04/25/14  9:04 PM  Result Value Ref Range Status   Specimen Description TISSUE BACK  Final   Special Requests PATIENT ON FOLLOWING DOXYCYCLINE SPECIMEN B NO 2  Final   Acid Fast Smear   Final    NO ACID FAST BACILLI SEEN Performed at [facility]    Culture   Final    CULTURE WILL BE EXAMINED FOR 6 WEEKS BEFORE ISSUING A FINAL REPORT Performed at [facility]    Report Status PENDING  Incomplete  Anaerobic culture     Status: None (Preliminary result)   Collection Time: 04/25/14  9:04 PM  Result Value Ref Range Status   Specimen Description TISSUE BACK  Final   Special Requests PATIENT ON FOLLOWING DOXYCYCLINE SPECIMEN B NO 2  Final   Gram Stain   Final    FEW WBC PRESENT,BOTH PMN AND MONONUCLEAR NO ORGANISMS SEEN Performed at [facility]    Culture   Final    NO ANAEROBES ISOLATED; CULTURE IN PROGRESS FOR 5 DAYS Performed at [facility]    Report Status PENDING  Incomplete  Wound culture     Status: None (Preliminary result)   Collection Time: 04/25/14  9:08 PM  Result Value Ref Range Status   Specimen Description WOUND BACK  Final   Special Requests PATIENT ON FOLLOWING DOXYCYLINE SPECIMEN A NO 1  Final   Gram Stain   Final    ABUNDANT WBC PRESENT,BOTH PMN AND MONONUCLEAR NO SQUAMOUS EPITHELIAL CELLS SEEN MODERATE GRAM POSITIVE COCCI IN PAIRS IN CLUSTERS Performed at [facility] Performed at [facility]    Culture   Final    MODERATE STAPHYLOCOCCUS  AUREUS Note: RIFAMPIN AND GENTAMICIN SHOULD NOT BE USED AS SINGLE DRUGS FOR TREATMENT OF STAPH INFECTIONS. Performed at [facility]    Report Status PENDING  Incomplete  Gram stain     Status: None   Collection Time: 04/25/14  9:08 PM  Result Value Ref Range Status   Specimen Description WOUND BACK  Final   Special Requests PATIENT ON FOLLOWING DOXYCYLINE SPECIMEN A NO 1  Final   Gram Stain   Final    ABUNDANT WBC PRESENT,BOTH PMN AND MONONUCLEAR MODERATE GRAM POSITIVE COCCI IN PAIRS IN CLUSTERS    Report Status 04/25/2014 FINAL  Final  Anaerobic culture     Status: None (Preliminary result)   Collection Time: 04/25/14  9:08 PM  Result Value Ref Range Status   Specimen Description WOUND BACK  Final   Special Requests PATIENT ON FOLLOWING DOXYCYLINE SPECIMEN A NO 1  Final   Gram Stain   Final    ABUNDANT WBC PRESENT,BOTH PMN AND MONONUCLEAR NO SQUAMOUS EPITHELIAL CELLS SEEN MODERATE GRAM POSITIVE COCCI IN PAIRS IN CLUSTERS Performed at [facility] Performed at [facility]    Culture   Final    NO ANAEROBES ISOLATED; CULTURE IN PROGRESS FOR 5 DAYS Performed at [facility]    Report Status PENDING  Incomplete  Wound culture     Status: None (Preliminary result)   Collection Time: 04/25/14  9:08 PM  Result Value Ref Range Status   Specimen Description WOUND BACK  Final   Special Requests PATIENT ON FOLLOWING DOXYCYLINE SPECIMEN A NO 2  Final   Gram Stain   Final    ABUNDANT WBC PRESENT,BOTH PMN AND MONONUCLEAR NO SQUAMOUS EPITHELIAL CELLS SEEN MODERATE GRAM POSITIVE COCCI IN PAIRS IN CLUSTERS Performed at [facility] Performed at [facility]    Culture   Final    ABUNDANT STAPHYLOCOCCUS AUREUS Note: RIFAMPIN AND GENTAMICIN SHOULD NOT BE USED AS SINGLE DRUGS FOR TREATMENT OF STAPH INFECTIONS. Performed at [facility]    Report Status PENDING  Incomplete  Anaerobic culture     Status: None (Preliminary result)    Collection Time: 04/25/14  9:08 PM  Result Value Ref Range Status   Specimen Description WOUND BACK  Final   Special Requests PATIENT ON FOLLOWING DOXYCYLINE SPECIMEN A NO 2  Final   Gram Stain   Final    ABUNDANT WBC PRESENT,BOTH PMN AND MONONUCLEAR NO SQUAMOUS EPITHELIAL CELLS SEEN MODERATE GRAM POSITIVE COCCI IN PAIRS IN CLUSTERS Performed at [facility] Performed at [facility]    Culture   Final    NO ANAEROBES ISOLATED; CULTURE IN PROGRESS FOR 5 DAYS Performed at [facility]    Report Status PENDING  Incomplete  Gram stain     Status: None   Collection Time: 04/25/14  9:08 PM  Result Value Ref Range Status   Specimen Description WOUND BACK  Final   Special Requests PATIENT ON FOLLOWING DOXYCYLINE SPECIMEN A NO 2  Final   Gram Stain   Final    ABUNDANT WBC PRESENT,BOTH PMN AND MONONUCLEAR MODERATE GRAM POSITIVE COCCI IN PAIRS IN CLUSTERS    Report Status 04/25/2014 FINAL  Final     Impression/Recommendation  Active Problems:   Wound infection after surgery   GRAZIA TAFFE is a 58 y.o. female with recent lumbar surgery now  with superficial postoperative infection, though she had "seroma" and fluid deeper to fascia and is now sp I and D   #1 Staphylococcus aureus postoperative infection  Thought we have not proven DEEP infection, I would err on the side of treating for deep infection in terms of duration  I would give her 4-6 weeks of targetted anti Staphylococcus aureus  IV antibiotics (either ancef for MSSA or vancomycin for MRSA)  I stopped her HCTZ diuretic while she is getting vancomycin   #2 Screening: check HIV and viral hep panel   Dr. Linus Salmons is covering for me tomorrow and Dr. Megan Salon will be here this weekend. I will be back on Monday.  04/27/2014, 5:17 PM   Thank you so much for this interesting consult  [place] [facility] [place] [phone] (pager) [phone] (office) 04/27/2014,  5:17 PM  [place] 04/27/2014, 5:17 PM

## 2014-04-27 NOTE — Consult Note (Signed)
WOC wound consult note Spoke with Dr. Newell Coral this am to confirm [facility] change and the contents of the dressing. Contacted floor staff to request pain meds prior to dressing change.  Reason for Consult: NPWT VAC dressing change  Wound type: surgical s/p lumber wound debridement  Measurement: 9cm x 5cm x 4cm  Wound bed: clean, pink, sanguinous oozing quite a bit with dressing change  Drainage (amount, consistency, odor) moderate amount in the canister,serousanginous    Periwound: intact  Dressing procedure/placement/frequency: Attempted to remove dressing after patient received PO pain meds, she is in quite a bit of pain with dressing removal, she requested additional pain meds.  IM pain meds administered per bedside nursing staff. Moistened foam with normal saline to loosen dressing.   1pc of black foam removed and 1pc of black foam used to fill the wound bed, draped and sealed at . Pt tolerated but did require quite a bit of PO and IM pain meds.  She may need to have an additional dressing change here in the hospital prior to dc to home to determine if PO pain meds will manage her for home.  Explained home VAC unit to patient. Discussed POC with CM for home VAC needs.   Ok for bedside nursing staff to change dressing Discussed POC with patient and bedside nurse.  Re consult if needed, will not follow at this time. Thanks  Nekeya Briski [employer], CWOCN [phone])

## 2014-04-28 DIAGNOSIS — T148 Other injury of unspecified body region: Secondary | ICD-10-CM

## 2014-04-28 DIAGNOSIS — B999 Unspecified infectious disease: Secondary | ICD-10-CM

## 2014-04-28 LAB — BASIC METABOLIC PANEL
Anion gap: 13 (ref 5–15)
BUN: 5 mg/dL — ABNORMAL LOW (ref 6–23)
CO2: 26 mEq/L (ref 19–32)
Calcium: 9.3 mg/dL (ref 8.4–10.5)
Chloride: 103 mEq/L (ref 96–112)
Creatinine, Ser: 0.71 mg/dL (ref 0.50–1.10)
GFR calc Af Amer: >90 mL/min (ref >90)
GFR calc non Af Amer: >90 mL/min (ref >90)
Glucose, Bld: 91 mg/dL (ref 70–99)
Potassium: 3.4 mEq/L — ABNORMAL LOW (ref 3.7–5.3)
Sodium: 142 mEq/L (ref 137–147)

## 2014-04-28 LAB — TISSUE CULTURE

## 2014-04-28 LAB — HEPATITIS PANEL, ACUTE
HCV Ab: NEGATIVE
Hep A IgM: NONREACTIVE
Hep B C IgM: NONREACTIVE
Hepatitis B Surface Ag: NEGATIVE

## 2014-04-28 LAB — VANCOMYCIN, TROUGH: Vancomycin Tr: 15.2 ug/mL (ref 10.0–20.0)

## 2014-04-28 LAB — WOUND CULTURE

## 2014-04-28 LAB — HIV ANTIBODY (ROUTINE TESTING W REFLEX): HIV 1&2 Ab, 4th Generation: NONREACTIVE

## 2014-04-28 MED ORDER — OXYCODONE HCL 5 MG PO TABS: 5.0000 mg | ORAL_TABLET | ORAL | Status: DC | PRN

## 2014-04-28 NOTE — Plan of Care (Signed)
Problem: Discharge Progression Outcomes Goal: Barriers To Progression Addressed/Resolved Outcome: Completed/Met Date Met:  04/28/14 Goal: Discharge plan in place and appropriate Outcome: Completed/Met Date Met:  04/28/14 Goal: Hemodynamically stable Outcome: Completed/Met Date Met:  04/28/14 Goal: Tolerating diet Outcome: Completed/Met Date Met:  04/28/14

## 2014-04-28 NOTE — Progress Notes (Signed)
Lab called to notified me of 4 +cultures for MRSA. Notified Dr Luciana Axe from Infectious Disease of positive results at this time. No new orders given. Pt already on contact precautions. Will continue to monitor Pt.

## 2014-04-28 NOTE — Care Management (Signed)
CARE MANAGEMENT NOTE 04/28/2014  Patient:  [name], [name]   Account Number:  [id number]  Date Initiated:  04/24/2014  Documentation initiated by:  Vance Peper  Subjective/Objective Assessment:   58 yr old female admitted with lumbar wound infection. Patient had a wound I & D with wound vac placement.     Action/Plan:   Case manager spoke with patient concerning discharge home health needs. Wound Vac and Home Health RN for PICC care and IV antibiotics. Referral called to [facility] , [facility] liaison. Patient has family support at discharge.   Anticipated DC Date:  04/29/2014   Anticipated DC Plan:  HOME W HOME HEALTH SERVICES      DC Planning Services  CM consult      [facility] Choice  HOME HEALTH  DURABLE MEDICAL EQUIPMENT   Choice offered to / List presented to:  [name]   DME arranged  VAC      DME agency  [facility]     HH arranged  HH-1 RN      [facility] agency  [facility]   Status of service:  Completed, signed off Medicare Important Message given?   (If response is "NO", the following Medicare IM given date fields will be blank) Date Medicare IM given:   Medicare IM given by:   Date Additional Medicare IM given:   Additional Medicare IM given by:    Discharge Disposition:  HOME W HOME HEALTH SERVICES  Per UR Regulation:  Reviewed for med. necessity/level of care/duration of stay  If discussed at Long Length of Stay Meetings, dates discussed:    Comments:  04/28/14 11AM Vance Peper, RN BSN Case Manager Wound Vac has arrived from [facility]. [facility] RN will manage IB antibiotics and lab draws. Patient will discharge on Vancomycin IV for 4 weeks, will need weekly CBC, CMP and Vanc trough.  [facility] liaison has orders.

## 2014-04-28 NOTE — Plan of Care (Signed)
Problem: Discharge Progression Outcomes Goal: Pain controlled with appropriate interventions Outcome: Completed/Met Date Met:  04/28/14     

## 2014-04-28 NOTE — Progress Notes (Signed)
[facility]  Patient Status: New pt  For [facility] this admission  [facility] is providing the following services: HHRN and Home Infusion Pharmacy for home IV ABX.  [facility] hospital team will follow pt until DC to support transition home.  [facility] Infusion Coordinator provided in hospital IV ABX teaching today in room. Pt performed full mock set up /administration of IV "Vancomycin" to support independence at home. [facility] already has IV Vancomycin orders in preparation for DC home as planned on 04-29-14.  If patient discharges after hours, please call [phone].   Sedalia Muta 04/28/2014, 4:44 PM

## 2014-04-28 NOTE — Discharge Summary (Signed)
Physician Discharge Summary  Patient ID: [name] MRN: [id number] DOB/AGE: 1956/02/07 58 y.o.  Admit date: 04/24/2014 Discharge date: 04/28/2014  Admission Diagnoses:  Wound drainage, suspected wound infection  Discharge Diagnoses:  Methicillin resistant staph aureus wound infection  Active Problems:   Wound infection after surgery   Lumbar surgical wound fluid collection   Wound drainage   Staphylococcus aureus infection   Screen for STD (sexually transmitted disease)   Discharged Condition: good  Hospital Course:  Patient was admitted, laboratories, blood cultures, an MRI of the lumbar spine without and with gadolinium were obtained. MRI showed both a superficial, suprafascial fluid and gas collection, as well as a infrafascial fluid collection that was suspected to be a seroma. Patient was taken to surgery the following day for wound debridement. The superficial collection was purulent and swab and tissue cultures were sent. Gram stain showed gram-positive cocci in pairs and clusters. Cultures and sensitivities show methicillin-resistant staph aureus.  The deep collection was found to be a seroma. A wound VAC was placed the time of surgery, and the first dressing change has been completed.  Patient has been seen in infectious disease consultation by Dr. Daiva Eves, and his partner Dr. Luciana Axe has followed up. They've recommended vancomycin IV for 4-6 weeks.  A PICC line was placed, and [facility] has been consulted, and will handle the wound VAC dressing changes 3 times a week, as well as the home antibiotics therapy.  Patient is up and ambulate actively in the halls. Initial pain management was poor, but Toradol IV was added, and the pain management is much improved. She is being discharged to home on OxyIR 5 mg tablets, 1 or 2 every 4 hours when necessary for pain.  She's also going to resume her meloxicam that she was taking prior to admission.  She is to follow-up with me in the  office in 2-3 weeks, and to follow up with Dr. Daiva Eves in 3-4 weeks.  Consults: ID  Discharge Exam: Blood pressure 122/58, pulse 88, temperature 98.4 F (36.9 C), temperature source Oral, resp. rate 16, SpO2 98 %.  Disposition: home     Medication List    TAKE these medications        ALPRAZolam 0.25 MG tablet  Commonly known as:  XANAX  Take 0.25 mg by mouth daily as needed for anxiety.     buPROPion 150 MG 12 hr tablet  Commonly known as:  ZYBAN  Take 450 mg by mouth daily.     cyclobenzaprine 10 MG tablet  Commonly known as:  FLEXERIL  Take 1 tablet (10 mg total) by mouth 3 (three) times daily as needed for muscle spasms.     doxycycline 100 MG tablet  Commonly known as:  VIBRA-TABS  Take 100 mg by mouth 2 (two) times daily.     estradiol 1 MG tablet  Commonly known as:  ESTRACE  Take 1 mg by mouth at bedtime.     fexofenadine 180 MG tablet  Commonly known as:  ALLEGRA  Take 180 mg by mouth daily as needed for allergies.     gabapentin 600 MG tablet  Commonly known as:  NEURONTIN  Take 600 mg by mouth 2 (two) times daily.     levothyroxine 200 MCG tablet  Commonly known as:  SYNTHROID, LEVOTHROID  Take 200 mcg by mouth daily before breakfast.     losartan-hydrochlorothiazide 100-12.5 MG per tablet  Commonly known as:  HYZAAR  Take 1 tablet  by mouth daily.     meloxicam 15 MG tablet  Commonly known as:  MOBIC  Take 15 mg by mouth 2 (two) times daily.     NEXIUM 24HR 20 MG capsule  Generic drug:  esomeprazole  Take 20 mg by mouth daily.     oxyCODONE 5 MG immediate release tablet  Commonly known as:  Oxy IR/ROXICODONE  Take 1-2 tablets (5-10 mg total) by mouth every 4 (four) hours as needed (pain).     oxyCODONE-acetaminophen 5-325 MG per tablet  Commonly known as:  PERCOCET/ROXICET  Take 1-2 tablets by mouth every 4 (four) hours as needed for moderate pain.     pramipexole 0.125 MG tablet  Commonly known as:  MIRAPEX  Take 0.125 mg by mouth 3  (three) times daily.           Follow-up Information    Follow up with [facility].   Why:  Someone from [facility] will contact you concerning time for home Health RN to come out.   Contact information:   [address] [place] [address] [phone]       Signed: [name], MD 04/28/2014, 5:40 PM

## 2014-04-28 NOTE — Progress Notes (Signed)
Subjective: Patient sitting up in chair, much more comfortable since starting Toradol 30 mg IV every 6 hours last night. Using much less narcotic pain medication. Wound cultures show staph aureus, sensitivities are pending. Dr. Luciana Axe to follow-up for Dr. Daiva Eves in infectious disease consultation today.  Objective: Vital signs in last 24 hours: Filed Vitals:   04/27/14 0527 04/27/14 1300 04/27/14 2058 04/28/14 0528  BP: 111/62 161/83 127/71 120/57  Pulse: 95 99 93 86  Temp: 98.4 F (36.9 C) 98.1 F (36.7 C) 98.3 F (36.8 C) 98.3 F (36.8 C)  TempSrc: Oral  Oral Oral  Resp:  16 16 16   SpO2: 100% 100% 97% 100%    Intake/Output from previous day: 11/05 0701 - 11/06 0700 In: 720 [P.O.:720] Out: 85 [Drains:85] Intake/Output this shift:    Physical Exam:  Work alert, moving all extremities well. Dressing intact. Wound VAC working well.  CBC  Recent Labs  04/27/14 0500  WBC 7.7  HGB 10.0*  HCT 29.8*  PLT 366   BMET  Recent Labs  04/27/14 0500 04/28/14 0459  NA 143 142  K 3.7 3.4*  CL 103 103  CO2 27 26  GLUCOSE 92 91  BUN 5* 5*  CREATININE 0.67 0.71  CALCIUM 9.2 9.3    Assessment/Plan: Much improved. Encouraged to ambulate in the halls. Awaiting culture sensitivities, and final antibiotic recommendations from infectious disease consultant, that can be transitioned to home antibiotic therapy. Arrangements made for home wound VAC. Nursing staff to do wound VAC dressing change tomorrow.   13/06/15, MD 04/28/2014, 7:48 AM

## 2014-04-28 NOTE — Progress Notes (Signed)
[facility]  Date of Admission:  04/24/2014  Antibiotics: vancomycin  Subjective: Feels much better, less pain  Objective: Temp:  [98.1 F (36.7 C)-98.3 F (36.8 C)] 98.3 F (36.8 C) (11/06 0528) Pulse Rate:  [86-99] 86 (11/06 0528) Resp:  [16] 16 (11/06 0528) BP: (120-161)/(57-83) 120/57 mmHg (11/06 0528) SpO2:  [97 %-100 %] 100 % (11/06 0528)  General: awake, nad Skin: no rashes    Lab Results Lab Results  Component Value Date   WBC 7.7 04/27/2014   HGB 10.0* 04/27/2014   HCT 29.8* 04/27/2014   MCV 85.6 04/27/2014   PLT 366 04/27/2014    Lab Results  Component Value Date   CREATININE 0.71 04/28/2014   BUN 5* 04/28/2014   NA 142 04/28/2014   K 3.4* 04/28/2014   CL 103 04/28/2014   CO2 26 04/28/2014    Lab Results  Component Value Date   ALT 16 04/24/2014   AST 15 04/24/2014   ALKPHOS 113 04/24/2014   BILITOT 0.3 04/24/2014      Microbiology: Recent Results (from the past 240 hour(s))  Culture, blood (routine x 2)     Status: None (Preliminary result)   Collection Time: 04/24/14  9:28 PM  Result Value Ref Range Status   Specimen Description BLOOD LEFT ARM  Final   Special Requests BOTTLES DRAWN AEROBIC AND ANAEROBIC 5CC EACH  Final   Culture  Setup Time   Final    04/25/2014 08:59 Performed at [facility]    Culture   Final           BLOOD CULTURE RECEIVED NO GROWTH TO DATE CULTURE WILL BE HELD FOR 5 DAYS BEFORE ISSUING A FINAL NEGATIVE REPORT Performed at [facility]    Report Status PENDING  Incomplete  Culture, blood (routine x 2)     Status: None (Preliminary result)   Collection Time: 04/24/14  9:37 PM  Result Value Ref Range Status   Specimen Description BLOOD RIGHT ARM  Final   Special Requests BOTTLES DRAWN AEROBIC AND ANAEROBIC 5CC  Final   Culture  Setup Time   Final    04/25/2014 08:59 Performed at [facility]    Culture   Final           BLOOD CULTURE RECEIVED NO GROWTH TO  DATE CULTURE WILL BE HELD FOR 5 DAYS BEFORE ISSUING A FINAL NEGATIVE REPORT Performed at [facility]    Report Status PENDING  Incomplete  Gram stain     Status: None   Collection Time: 04/25/14  9:04 PM  Result Value Ref Range Status   Specimen Description TISSUE BACK  Final   Special Requests PATIENT ON FOLLOWING DOXYCYLINE SPECIMEN B NO 1  Final   Gram Stain   Final    RARE WBC PRESENT,BOTH PMN AND MONONUCLEAR NO ORGANISMS SEEN    Report Status 04/25/2014 FINAL  Final  Tissue culture     Status: None   Collection Time: 04/25/14  9:04 PM  Result Value Ref Range Status   Specimen Description TISSUE BACK  Final   Special Requests PATIENT ON FOLLOWING DOXYCYCLINE SPECIMEN B NO 1  Final   Gram Stain   Final    RARE WBC PRESENT,BOTH PMN AND MONONUCLEAR NO ORGANISMS SEEN Performed at [facility] Performed at [facility]    Culture   Final    FEW METHICILLIN RESISTANT STAPHYLOCOCCUS AUREUS Note: RIFAMPIN AND GENTAMICIN SHOULD NOT BE USED  AS SINGLE DRUGS FOR TREATMENT OF STAPH INFECTIONS. This organism DOES NOT demonstrate inducible Clindamycin resistance in vitro. CRITICAL RESULT CALLED TO, READ BACK BY AND VERIFIED WITH: KARY CORUM  04/28/14 AT 810 AM BY [facility] Performed at [facility]    Report Status 04/28/2014 FINAL  Final   Organism ID, Bacteria METHICILLIN RESISTANT STAPHYLOCOCCUS AUREUS  Final      Susceptibility   Methicillin resistant staphylococcus aureus - MIC*    CLINDAMYCIN <=0.25 SENSITIVE Sensitive     ERYTHROMYCIN >=8 RESISTANT Resistant     GENTAMICIN <=0.5 SENSITIVE Sensitive     LEVOFLOXACIN 4 INTERMEDIATE Intermediate     OXACILLIN >=4 RESISTANT Resistant     PENICILLIN >=0.5 RESISTANT Resistant     RIFAMPIN <=0.5 SENSITIVE Sensitive     TRIMETH/SULFA <=10 SENSITIVE Sensitive     VANCOMYCIN 1 SENSITIVE Sensitive     TETRACYCLINE <=1 SENSITIVE Sensitive     * FEW METHICILLIN RESISTANT STAPHYLOCOCCUS AUREUS  Fungus Culture  with Smear     Status: None (Preliminary result)   Collection Time: 04/25/14  9:04 PM  Result Value Ref Range Status   Specimen Description TISSUE BACK  Final   Special Requests PATIENT ON FOLLOWING DOXYCYCLINE SPECIMEN B NO 1  Final   Fungal Smear   Final    NO YEAST OR FUNGAL ELEMENTS SEEN Performed at [facility]    Culture   Final    CULTURE IN PROGRESS FOR FOUR WEEKS Performed at [facility]    Report Status PENDING  Incomplete  AFB culture with smear     Status: None (Preliminary result)   Collection Time: 04/25/14  9:04 PM  Result Value Ref Range Status   Specimen Description TISSUE BACK  Final   Special Requests PATIENT ON FOLLOWING DOXYCYCLINE SPECIMEN B NO 1  Final   Acid Fast Smear   Final    NO ACID FAST BACILLI SEEN Performed at [facility]    Culture   Final    CULTURE WILL BE EXAMINED FOR 6 WEEKS BEFORE ISSUING A FINAL REPORT Performed at [facility]    Report Status PENDING  Incomplete  Anaerobic culture     Status: None (Preliminary result)   Collection Time: 04/25/14  9:04 PM  Result Value Ref Range Status   Specimen Description TISSUE BACK  Final   Special Requests PATIENT ON FOLLOWING DOXYCYCLINE SPECIMEN B NO 1  Final   Gram Stain   Final    RARE WBC PRESENT,BOTH PMN AND MONONUCLEAR NO ORGANISMS SEEN Performed at [facility] Performed at [facility]    Culture   Final    NO ANAEROBES ISOLATED; CULTURE IN PROGRESS FOR 5 DAYS Performed at [facility]    Report Status PENDING  Incomplete  Gram stain     Status: None   Collection Time: 04/25/14  9:04 PM  Result Value Ref Range Status   Specimen Description TISSUE BACK  Final   Special Requests PATIENT ON FOLLOWING DOXYCYCLINE SPECIMEN B NO 2  Final   Gram Stain   Final    FEW WBC PRESENT,BOTH PMN AND MONONUCLEAR NO ORGANISMS SEEN    Report Status 04/25/2014 FINAL  Final  Tissue culture     Status: None   Collection Time: 04/25/14   9:04 PM  Result Value Ref Range Status   Specimen Description TISSUE BACK  Final   Special Requests PATIENT ON FOLLOWING DOXYCYCLINE SPECIMEN B NO 2  Final   Gram  Stain   Final    FEW WBC PRESENT,BOTH PMN AND MONONUCLEAR NO ORGANISMS SEEN Performed at [facility] Performed at [facility]    Culture   Final    FEW METHICILLIN RESISTANT STAPHYLOCOCCUS AUREUS Note: RIFAMPIN AND GENTAMICIN SHOULD NOT BE USED AS SINGLE DRUGS FOR TREATMENT OF STAPH INFECTIONS. This organism DOES NOT demonstrate inducible Clindamycin resistance in vitro. CRITICAL RESULT CALLED TO, READ BACK BY AND VERIFIED WITH: KARY CORUM  04/28/14 AT 810 AM BY [facility] Performed at [facility]    Report Status 04/28/2014 FINAL  Final   Organism ID, Bacteria METHICILLIN RESISTANT STAPHYLOCOCCUS AUREUS  Final      Susceptibility   Methicillin resistant staphylococcus aureus - MIC*    CLINDAMYCIN <=0.25 SENSITIVE Sensitive     ERYTHROMYCIN >=8 RESISTANT Resistant     GENTAMICIN <=0.5 SENSITIVE Sensitive     LEVOFLOXACIN 4 INTERMEDIATE Intermediate     OXACILLIN >=4 RESISTANT Resistant     PENICILLIN >=0.5 RESISTANT Resistant     RIFAMPIN <=0.5 SENSITIVE Sensitive     TRIMETH/SULFA <=10 SENSITIVE Sensitive     VANCOMYCIN 1 SENSITIVE Sensitive     TETRACYCLINE <=1 SENSITIVE Sensitive     * FEW METHICILLIN RESISTANT STAPHYLOCOCCUS AUREUS  Fungus Culture with Smear     Status: None (Preliminary result)   Collection Time: 04/25/14  9:04 PM  Result Value Ref Range Status   Specimen Description TISSUE BACK  Final   Special Requests PATIENT ON FOLLOWING DOXYCYCLINE SPECIMEN B NO 2  Final   Fungal Smear   Final    NO YEAST OR FUNGAL ELEMENTS SEEN Performed at [facility]    Culture   Final    CULTURE IN PROGRESS FOR FOUR WEEKS Performed at [facility]    Report Status PENDING  Incomplete  AFB culture with smear     Status: None (Preliminary result)   Collection Time: 04/25/14   9:04 PM  Result Value Ref Range Status   Specimen Description TISSUE BACK  Final   Special Requests PATIENT ON FOLLOWING DOXYCYCLINE SPECIMEN B NO 2  Final   Acid Fast Smear   Final    NO ACID FAST BACILLI SEEN Performed at [facility]    Culture   Final    CULTURE WILL BE EXAMINED FOR 6 WEEKS BEFORE ISSUING A FINAL REPORT Performed at [facility]    Report Status PENDING  Incomplete  Anaerobic culture     Status: None (Preliminary result)   Collection Time: 04/25/14  9:04 PM  Result Value Ref Range Status   Specimen Description TISSUE BACK  Final   Special Requests PATIENT ON FOLLOWING DOXYCYCLINE SPECIMEN B NO 2  Final   Gram Stain   Final    FEW WBC PRESENT,BOTH PMN AND MONONUCLEAR NO ORGANISMS SEEN Performed at [facility]    Culture   Final    NO ANAEROBES ISOLATED; CULTURE IN PROGRESS FOR 5 DAYS Performed at [facility]    Report Status PENDING  Incomplete  Wound culture     Status: None   Collection Time: 04/25/14  9:08 PM  Result Value Ref Range Status   Specimen Description WOUND BACK  Final   Special Requests PATIENT ON FOLLOWING DOXYCYLINE SPECIMEN A NO 1  Final   Gram Stain   Final    ABUNDANT WBC PRESENT,BOTH PMN AND MONONUCLEAR NO SQUAMOUS EPITHELIAL CELLS SEEN MODERATE GRAM POSITIVE COCCI IN PAIRS IN CLUSTERS Performed at [facility] Performed at [facility]    Culture   Final    MODERATE METHICILLIN RESISTANT STAPHYLOCOCCUS AUREUS Note: RIFAMPIN AND GENTAMICIN SHOULD NOT BE USED AS SINGLE DRUGS FOR TREATMENT OF STAPH INFECTIONS. This organism DOES NOT demonstrate inducible Clindamycin resistance in vitro. CRITICAL RESULT CALLED TO, READ BACK BY AND VERIFIED WITH: KARY CORUM  04/28/14 AT 810 AM BY [facility] Performed at [facility]    Report Status 04/28/2014 FINAL  Final   Organism ID, Bacteria METHICILLIN RESISTANT STAPHYLOCOCCUS AUREUS  Final      Susceptibility   Methicillin resistant  staphylococcus aureus - MIC*    CLINDAMYCIN <=0.25 SENSITIVE Sensitive     ERYTHROMYCIN >=8 RESISTANT Resistant     GENTAMICIN <=0.5 SENSITIVE Sensitive     LEVOFLOXACIN 4 INTERMEDIATE Intermediate     OXACILLIN >=4 RESISTANT Resistant     PENICILLIN >=0.5 RESISTANT Resistant     RIFAMPIN <=0.5 SENSITIVE Sensitive     TRIMETH/SULFA <=10 SENSITIVE Sensitive     VANCOMYCIN 1 SENSITIVE Sensitive     TETRACYCLINE <=1 SENSITIVE Sensitive     * MODERATE METHICILLIN RESISTANT STAPHYLOCOCCUS AUREUS  Gram stain     Status: None   Collection Time: 04/25/14  9:08 PM  Result Value Ref Range Status   Specimen Description WOUND BACK  Final   Special Requests PATIENT ON FOLLOWING DOXYCYLINE SPECIMEN A NO 1  Final   Gram Stain   Final    ABUNDANT WBC PRESENT,BOTH PMN AND MONONUCLEAR MODERATE GRAM POSITIVE COCCI IN PAIRS IN CLUSTERS    Report Status 04/25/2014 FINAL  Final  Anaerobic culture     Status: None (Preliminary result)   Collection Time: 04/25/14  9:08 PM  Result Value Ref Range Status   Specimen Description WOUND BACK  Final   Special Requests PATIENT ON FOLLOWING DOXYCYLINE SPECIMEN A NO 1  Final   Gram Stain   Final    ABUNDANT WBC PRESENT,BOTH PMN AND MONONUCLEAR NO SQUAMOUS EPITHELIAL CELLS SEEN MODERATE GRAM POSITIVE COCCI IN PAIRS IN CLUSTERS Performed at [facility] Performed at [facility]    Culture   Final    NO ANAEROBES ISOLATED; CULTURE IN PROGRESS FOR 5 DAYS Performed at [facility]    Report Status PENDING  Incomplete  Wound culture     Status: None   Collection Time: 04/25/14  9:08 PM  Result Value Ref Range Status   Specimen Description WOUND BACK  Final   Special Requests PATIENT ON FOLLOWING DOXYCYLINE SPECIMEN A NO 2  Final   Gram Stain   Final    ABUNDANT WBC PRESENT,BOTH PMN AND MONONUCLEAR NO SQUAMOUS EPITHELIAL CELLS SEEN MODERATE GRAM POSITIVE COCCI IN PAIRS IN CLUSTERS Performed at [facility] Performed at [employer]    Culture   Final    ABUNDANT METHICILLIN RESISTANT STAPHYLOCOCCUS AUREUS Note: RIFAMPIN AND GENTAMICIN SHOULD NOT BE USED AS SINGLE DRUGS FOR TREATMENT OF STAPH INFECTIONS. This organism DOES NOT demonstrate inducible Clindamycin resistance in vitro. CRITICAL RESULT CALLED TO, READ BACK BY AND VERIFIED WITH: KARY CORUM  04/28/14 AT 810 AM BY [facility] Performed at [facility]    Report Status 04/28/2014 FINAL  Final   Organism ID, Bacteria METHICILLIN RESISTANT STAPHYLOCOCCUS AUREUS  Final      Susceptibility   Methicillin resistant staphylococcus aureus - MIC*    CLINDAMYCIN <=0.25 SENSITIVE Sensitive     ERYTHROMYCIN >=8 RESISTANT Resistant     GENTAMICIN <=0.5 SENSITIVE Sensitive  LEVOFLOXACIN 4 INTERMEDIATE Intermediate     OXACILLIN >=4 RESISTANT Resistant     PENICILLIN >=0.5 RESISTANT Resistant     RIFAMPIN <=0.5 SENSITIVE Sensitive     TRIMETH/SULFA <=10 SENSITIVE Sensitive     VANCOMYCIN 1 SENSITIVE Sensitive     TETRACYCLINE <=1 SENSITIVE Sensitive     * ABUNDANT METHICILLIN RESISTANT STAPHYLOCOCCUS AUREUS  Anaerobic culture     Status: None (Preliminary result)   Collection Time: 04/25/14  9:08 PM  Result Value Ref Range Status   Specimen Description WOUND BACK  Final   Special Requests PATIENT ON FOLLOWING DOXYCYLINE SPECIMEN A NO 2  Final   Gram Stain   Final    ABUNDANT WBC PRESENT,BOTH PMN AND MONONUCLEAR NO SQUAMOUS EPITHELIAL CELLS SEEN MODERATE GRAM POSITIVE COCCI IN PAIRS IN CLUSTERS Performed at [facility] Performed at [facility]    Culture   Final    NO ANAEROBES ISOLATED; CULTURE IN PROGRESS FOR 5 DAYS Performed at [facility]    Report Status PENDING  Incomplete  Gram stain     Status: None   Collection Time: 04/25/14  9:08 PM  Result Value Ref Range Status   Specimen Description WOUND BACK  Final   Special Requests PATIENT ON FOLLOWING DOXYCYLINE SPECIMEN A NO 2  Final   Gram Stain   Final     ABUNDANT WBC PRESENT,BOTH PMN AND MONONUCLEAR MODERATE GRAM POSITIVE COCCI IN PAIRS IN CLUSTERS    Report Status 04/25/2014 FINAL  Final    Studies/Results: No results found.  Assessment/Plan: 1) superficial wound infection - seroma deep but not noted to be infected per op note. Could potentially be infected, not sure if any of the cultures are from the seroma to indicate that.  Will treat with vancomycin for 4 weeks. -will need weekly cbc, cmp, vanco trough to [facility] -trough goal 15-20 -we will see her in our clinic in 3-4 weeks  Thanks for consult  Staci Righter, MD [email or web address] [phone] pager   [phone] cell 04/28/2014, 11:19 AM

## 2014-04-29 MED ORDER — HEPARIN SOD (PORK) LOCK FLUSH 100 UNIT/ML IV SOLN
250.0000 [IU] | INTRAVENOUS | Status: AC | PRN
  Administered 2014-04-29: 250 [IU]

## 2014-04-29 NOTE — Progress Notes (Signed)
Patient discharged home with husband. Discharge instructions given. Prescriptions given. Wound vac changed. All questions answered. Pain medications given before discharged.

## 2014-04-29 NOTE — Progress Notes (Signed)
Patient ambulated the halls with her husband.  She walked around 100 ft.  Moved slowly but she did not c/o any pain or discomfort.

## 2014-05-01 LAB — CULTURE, BLOOD (ROUTINE X 2)
Culture: NO GROWTH
Culture: NO GROWTH

## 2014-05-03 ENCOUNTER — Ambulatory Visit: Payer: No Typology Code available for payment source

## 2014-05-03 LAB — ANAEROBIC CULTURE

## 2014-05-23 LAB — FUNGUS CULTURE W SMEAR
Fungal Smear: NONE SEEN
Fungal Smear: NONE SEEN

## 2014-05-25 ENCOUNTER — Ambulatory Visit (INDEPENDENT_AMBULATORY_CARE_PROVIDER_SITE_OTHER): Payer: Self-pay | Admitting: Internal Medicine

## 2014-05-25 ENCOUNTER — Telehealth: Payer: Self-pay | Admitting: *Deleted

## 2014-05-25 ENCOUNTER — Encounter: Payer: Self-pay | Admitting: Internal Medicine

## 2014-05-25 VITALS — BP 124/84 | HR 105 | Temp 97.9°F | Ht 65.0 in | Wt 234.0 lb

## 2014-05-25 DIAGNOSIS — IMO0001 Reserved for inherently not codable concepts without codable children: Secondary | ICD-10-CM

## 2014-05-25 DIAGNOSIS — T814XXD Infection following a procedure, subsequent encounter: Secondary | ICD-10-CM

## 2014-05-25 NOTE — Assessment & Plan Note (Signed)
Doing well.  Will finish tonight and then can stop, pull picc.  RTC prn

## 2014-05-25 NOTE — Progress Notes (Signed)
   Subjective:    Patient ID: [name], female    DOB: [birth year], 58 y.o.   MRN: [id number]  HPI [name] is an 58 y.o. female who had a lumbar decompression and arthrodesis 3-1/2 weeks ago. She developed fever and chills 4 days ago prior to admission and wound drainage 3 days ago. She was started  on doxycycline 100 mg twice a day by Dr. Wynetta Emery but then MRI done and showed:  IMPRESSION: 1. Postoperative changes from recent posterior decompression, fusion, and discectomy at L3-4 and L4-5. 2. 3.5 x 5.7 x 7.4 cm collection within the surgical bed at L3 through L4-5, favored to reflect postoperative seroma, although superimposed infection could be considered in the correct clinical setting. 3. Second 2.5 x 2.2 x 10.8 cm gas and fluid collection more superficially along the midline incisional wound with open drainage to the skin and possible communication with the deeper collection at the surgical bed as above. Again, possible infection/abscess could be considered in the correct clinical setting. 4. Stable sequelae of decompression and fusion at L5-S1. 5. Multilevel degenerative disc disease as above, with improved stenosis at L3-4 and L4-5 status post decompression. Degenerative disc disease and stenosis at L2-3 is similar to prior.   Dr Newell Coral took pt to the OR and performed LUMBAR WOUND DEBRIDEMENT and Placement of Wound VAC on 04/25/14. He encountered frank purulence superficially and sent this and tissue for culture (which is growing SA). He then debrided deeper into the fascia and found clear fluid consistent in appearance with seroma   culture grew MRSA from wound and started on vancomycin, finishing 4 weeks today.     Review of Systems  Constitutional: Negative for fever, chills and fatigue.  Gastrointestinal: Negative for nausea, abdominal pain and diarrhea.  Genitourinary: Negative for urgency.  Musculoskeletal: Positive for back pain.  Skin: Negative for rash.    Neurological: Negative for dizziness and light-headedness.       Objective:   Physical Exam  Constitutional: She appears well-developed and well-nourished. No distress.  Eyes: No scleral icterus.  Cardiovascular: Normal rate, regular rhythm and normal heart sounds.   No murmur heard. Pulmonary/Chest: Effort normal and breath sounds normal. No respiratory distress.  Skin: No rash noted.          Assessment & Plan:

## 2014-05-25 NOTE — Telephone Encounter (Signed)
Gave verbal order to pull PICC 12/4 to [facility], per Dr. Luciana Axe. Andree Coss, RN

## 2014-06-06 ENCOUNTER — Encounter: Payer: Self-pay | Admitting: Internal Medicine

## 2014-06-08 ENCOUNTER — Ambulatory Visit: Payer: Self-pay

## 2014-06-08 LAB — AFB CULTURE WITH SMEAR (NOT AT ARMC)
Acid Fast Smear: NONE SEEN
Acid Fast Smear: NONE SEEN

## 2014-11-30 ENCOUNTER — Ambulatory Visit: Payer: MEDICAID | Admitting: Neurology

## 2014-12-11 ENCOUNTER — Ambulatory Visit: Payer: MEDICAID | Admitting: Neurology

## 2015-02-19 NOTE — Patient Instructions (Addendum)
YOUR PROCEDURE IS SCHEDULED ON :  02/27/15  REPORT TO [facility] MAIN ENTRANCE FOLLOW SIGNS TO EAST ELEVATOR - GO TO 3rd FLOOR CHECK IN AT 3 EAST NURSES STATION (SHORT STAY) AT:  12:45 PM  CALL THIS NUMBER IF YOU HAVE PROBLEMS THE MORNING OF SURGERY [phone]  REMEMBER:ONLY 1 PER PERSON MAY GO TO SHORT STAY WITH YOU TO GET READY THE MORNING OF YOUR SURGERY  DO NOT EAT FOOD  AFTER MIDNIGHT  MAY HAVE CLEAR LIQUIDS UNTIL  9:45 AM  TAKE THESE MEDICINES THE MORNING OF SURGERY:BUPROPION / NEXIUM / MIRAPEX / XANAX  CLEAR LIQUID DIET  Foods Allowed                                                                     Foods Excluded  Coffee and tea, regular and decaf                             liquids that you cannot  Plain Jell-O in any flavor                                             see through such as: Fruit ices (not with fruit pulp)                                     milk, soups, orange juice  Iced Popsicles                                                 All solid food Carbonated beverages, regular and diet                                    Cranberry, grape and apple juices Sports drinks like Gatorade Lightly seasoned clear broth or consume(fat free) Sugar, honey syrup   _____________________________________________________________________    YOU MAY NOT HAVE ANY METAL ON YOUR BODY INCLUDING HAIR PINS AND PIERCING'S. DO NOT WEAR JEWELRY, MAKEUP, LOTIONS, POWDERS OR PERFUMES. DO NOT WEAR NAIL POLISH. DO NOT SHAVE 48 HRS PRIOR TO SURGERY. MEN MAY SHAVE FACE AND NECK.  DO NOT BRING VALUABLES TO HOSPITAL. [facility] IS NOT RESPONSIBLE FOR VALUABLES.  CONTACTS, DENTURES OR PARTIALS MAY NOT BE WORN TO SURGERY. LEAVE SUITCASE IN CAR. CAN BE BROUGHT TO ROOM AFTER SURGERY.  PATIENTS DISCHARGED THE DAY OF SURGERY WILL NOT BE ALLOWED TO DRIVE HOME.  PLEASE READ OVER THE FOLLOWING INSTRUCTION  SHEETS _________________________________________________________________________________                                          [name] - PREPARING FOR SURGERY  Before surgery, you can play an important role.  Because skin is not sterile, your skin needs to be as free of germs as possible.  You can reduce the number of germs on your skin by washing with CHG (chlorahexidine gluconate) soap before surgery.  CHG is an antiseptic cleaner which kills germs and bonds with the skin to continue killing germs even after washing. Please DO NOT use if you have an allergy to CHG or antibacterial soaps.  If your skin becomes reddened/irritated stop using the CHG and inform your nurse when you arrive at Short Stay. Do not shave (including legs and underarms) for at least 48 hours prior to the first CHG shower.  You may shave your face. Please follow these instructions carefully:   1.  Shower with CHG Soap the night before surgery and the  morning of Surgery.   2.  If you choose to wash your hair, wash your hair first as usual with your  normal  Shampoo.   3.  After you shampoo, rinse your hair and body thoroughly to remove the  shampoo.                                         4.  Use CHG as you would any other liquid soap.  You can apply chg directly  to the skin and wash . Gently wash with scrungie or clean wascloth    5.  Apply the CHG Soap to your body ONLY FROM THE NECK DOWN.   Do not use on open                           Wound or open sores. Avoid contact with eyes, ears mouth and genitals (private parts).                        Genitals (private parts) with your normal soap.              6.  Wash thoroughly, paying special attention to the area where your surgery  will be performed.   7.  Thoroughly rinse your body with warm water from the neck down.   8.  DO NOT shower/wash with your normal soap after using and rinsing off  the CHG Soap .                9.  Pat yourself dry with a clean  towel.             10.  Wear clean night clothes to bed after shower             11.  Place clean sheets on your bed the night of your first shower and do not  sleep with pets.  Day of Surgery : Do not apply any lotions/deodorants the morning of surgery.  Please wear clean clothes to the hospital/surgery center.  FAILURE TO FOLLOW THESE INSTRUCTIONS MAY RESULT IN THE CANCELLATION OF YOUR SURGERY    PATIENT SIGNATURE_________________________________  ______________________________________________________________________     [name]  An incentive spirometer is a tool that can help keep your lungs clear and active. This tool measures how well you are filling your lungs with each breath. Taking long deep breaths may help reverse or decrease the chance of developing breathing (pulmonary) problems (especially infection) following:  A long period of time when you are unable to move  or be active. BEFORE THE PROCEDURE   If the spirometer includes an indicator to show your best effort, your nurse or respiratory therapist will set it to a desired goal.  If possible, sit up straight or lean slightly forward. Try not to slouch.  Hold the incentive spirometer in an upright position. INSTRUCTIONS FOR USE   Sit on the edge of your bed if possible, or sit up as far as you can in bed or on a chair.  Hold the incentive spirometer in an upright position.  Breathe out normally.  Place the mouthpiece in your mouth and seal your lips tightly around it.  Breathe in slowly and as deeply as possible, raising the piston or the ball toward the top of the column.  Hold your breath for 3-5 seconds or for as long as possible. Allow the piston or ball to fall to the bottom of the column.  Remove the mouthpiece from your mouth and breathe out normally.  Rest for a few seconds and repeat Steps 1 through 7 at least 10 times every 1-2 hours when you are awake. Take your time and take a few  normal breaths between deep breaths.  The spirometer may include an indicator to show your best effort. Use the indicator as a goal to work toward during each repetition.  After each set of 10 deep breaths, practice coughing to be sure your lungs are clear. If you have an incision (the cut made at the time of surgery), support your incision when coughing by placing a pillow or rolled up towels firmly against it. Once you are able to get out of bed, walk around indoors and cough well. You may stop using the incentive spirometer when instructed by your caregiver.  RISKS AND COMPLICATIONS  Take your time so you do not get dizzy or light-headed.  If you are in pain, you may need to take or ask for pain medication before doing incentive spirometry. It is harder to take a deep breath if you are having pain. AFTER USE  Rest and breathe slowly and easily.  It can be helpful to keep track of a log of your progress. Your caregiver can provide you with a simple table to help with this. If you are using the spirometer at home, follow these instructions: [name] IF:   You are having difficultly using the spirometer.  You have trouble using the spirometer as often as instructed.  Your pain medication is not giving enough relief while using the spirometer.  You develop fever of 100.5 F (38.1 C) or higher. SEEK IMMEDIATE MEDICAL CARE IF:   You cough up bloody sputum that had not been present before.  You develop fever of 102 F (38.9 C) or greater.  You develop worsening pain at or near the incision site. MAKE SURE YOU:   Understand these instructions.  Will watch your condition.  Will get help right away if you are not doing well or get worse. Document Released: 10/20/2006 Document Revised: 09/01/2011 Document Reviewed: 12/21/2006 [employer] Patient Information 2014 [employer], [place].   ________________________________________________________________________  WHAT IS A BLOOD  TRANSFUSION? Blood Transfusion Information  A transfusion is the replacement of blood or some of its parts. Blood is made up of multiple cells which provide different functions.  Red blood cells carry oxygen and are used for blood loss replacement.  White blood cells fight against infection.  Platelets control bleeding.  Plasma helps clot blood.  Other blood products are available for  specialized needs, such as hemophilia or other clotting disorders. BEFORE THE TRANSFUSION  Who gives blood for transfusions?   Healthy volunteers who are fully evaluated to make sure their blood is safe. This is blood bank blood. Transfusion therapy is the safest it has ever been in the practice of medicine. Before blood is taken from a donor, a complete history is taken to make sure that person has no history of diseases nor engages in risky social behavior (examples are intravenous drug use or sexual activity with multiple partners). The donor's travel history is screened to minimize risk of transmitting infections, such as malaria. The donated blood is tested for signs of infectious diseases, such as HIV and hepatitis. The blood is then tested to be sure it is compatible with you in order to minimize the chance of a transfusion reaction. If you or a relative donates blood, this is often done in anticipation of surgery and is not appropriate for emergency situations. It takes many days to process the donated blood. RISKS AND COMPLICATIONS Although transfusion therapy is very safe and saves many lives, the main dangers of transfusion include:   Getting an infectious disease.  Developing a transfusion reaction. This is an allergic reaction to something in the blood you were given. Every precaution is taken to prevent this. The decision to have a blood transfusion has been considered carefully by your caregiver before blood is given. Blood is not given unless the benefits outweigh the risks. AFTER THE  TRANSFUSION  Right after receiving a blood transfusion, you will usually feel much better and more energetic. This is especially true if your red blood cells have gotten low (anemic). The transfusion raises the level of the red blood cells which carry oxygen, and this usually causes an energy increase.  The nurse administering the transfusion will monitor you carefully for complications. HOME CARE INSTRUCTIONS  No special instructions are needed after a transfusion. You may find your energy is better. Speak with your caregiver about any limitations on activity for underlying diseases you may have. SEEK MEDICAL CARE IF:   Your condition is not improving after your transfusion.  You develop redness or irritation at the intravenous (IV) site. SEEK IMMEDIATE MEDICAL CARE IF:  Any of the following symptoms occur over the next 12 hours:  Shaking chills.  You have a temperature by mouth above 102 F (38.9 C), not controlled by medicine.  Chest, back, or muscle pain.  People around you feel you are not acting correctly or are confused.  Shortness of breath or difficulty breathing.  Dizziness and fainting.  You get a rash or develop hives.  You have a decrease in urine output.  Your urine turns a dark color or changes to pink, red, or brown. Any of the following symptoms occur over the next 10 days:  You have a temperature by mouth above 102 F (38.9 C), not controlled by medicine.  Shortness of breath.  Weakness after normal activity.  The white part of the eye turns yellow (jaundice).  You have a decrease in the amount of urine or are urinating less often.  Your urine turns a dark color or changes to pink, red, or brown. Document Released: 06/06/2000 Document Revised: 09/01/2011 Document Reviewed: 01/24/2008 [facility] Patient Information 2014 [place], [place].  _______________________________________________________________________

## 2015-02-20 ENCOUNTER — Encounter (HOSPITAL_COMMUNITY)
Admission: RE | Admit: 2015-02-20 | Discharge: 2015-02-20 | Disposition: A | Discharge status: Home / Self Care | Payer: BLUE CROSS/BLUE SHIELD | Source: Ambulatory Visit | Attending: Orthopedic Surgery | Admitting: Orthopedic Surgery

## 2015-02-20 ENCOUNTER — Encounter (HOSPITAL_COMMUNITY): Payer: Self-pay

## 2015-02-20 DIAGNOSIS — M179 Osteoarthritis of knee, unspecified: Secondary | ICD-10-CM | POA: Insufficient documentation

## 2015-02-20 DIAGNOSIS — Z01818 Encounter for other preprocedural examination: Secondary | ICD-10-CM | POA: Insufficient documentation

## 2015-02-20 LAB — CBC
HCT: 41.1 % (ref 36.0–46.0)
Hemoglobin: 13.7 g/dL (ref 12.0–15.0)
MCH: 29.5 pg (ref 26.0–34.0)
MCHC: 33.3 g/dL (ref 30.0–36.0)
MCV: 88.4 fL (ref 78.0–100.0)
Platelets: 329 10*3/uL (ref 150–400)
RBC: 4.65 MIL/uL (ref 3.87–5.11)
RDW: 14.7 % (ref 11.5–15.5)
WBC: 7.6 10*3/uL (ref 4.0–10.5)

## 2015-02-20 LAB — BASIC METABOLIC PANEL
Anion gap: 10 (ref 5–15)
BUN: 13 mg/dL (ref 6–20)
CO2: 30 mmol/L (ref 22–32)
Calcium: 10.1 mg/dL (ref 8.9–10.3)
Chloride: 103 mmol/L (ref 101–111)
Creatinine, Ser: 0.83 mg/dL (ref 0.44–1.00)
GFR calc Af Amer: >60 mL/min (ref >60)
GFR calc non Af Amer: >60 mL/min (ref >60)
Glucose, Bld: 96 mg/dL (ref 65–99)
Potassium: 4.4 mmol/L (ref 3.5–5.1)
Sodium: 143 mmol/L (ref 135–145)

## 2015-02-20 LAB — ABO/RH: ABO/RH(D): O POS

## 2015-02-20 LAB — SURGICAL PCR SCREEN
MRSA, PCR: NEGATIVE
Staphylococcus aureus: NEGATIVE

## 2015-02-20 LAB — URINALYSIS, ROUTINE W REFLEX MICROSCOPIC
Bilirubin Urine: NEGATIVE
Glucose, UA: NEGATIVE mg/dL
Hgb urine dipstick: NEGATIVE
Ketones, ur: NEGATIVE mg/dL
Leukocytes, UA: NEGATIVE
Nitrite: NEGATIVE
Protein, ur: NEGATIVE mg/dL
Specific Gravity, Urine: 1.007 (ref 1.005–1.030)
Urobilinogen, UA: 0.2 mg/dL (ref 0.0–1.0)
pH: 7.5 (ref 5.0–8.0)

## 2015-02-20 LAB — APTT: aPTT: 37 seconds (ref 24–37)

## 2015-02-20 LAB — PROTIME-INR
INR: 0.95 (ref 0.00–1.49)
Prothrombin Time: 12.9 seconds (ref 11.6–15.2)

## 2015-02-20 NOTE — Progress Notes (Signed)
   02/20/15 1053  OBSTRUCTIVE SLEEP APNEA  Have you ever been diagnosed with sleep apnea through a sleep study? No  Do you snore loudly (loud enough to be heard through closed doors)?  0  Do you often feel tired, fatigued, or sleepy during the daytime? 1  Has anyone observed you stop breathing during your sleep? 0  Do you have, or are you being treated for high blood pressure? 1  BMI more than 35 kg/m2? 1  Age over 60 years old? 1  Neck circumference greater than 40 cm/16 inches? 0  Gender: 0

## 2015-02-23 NOTE — Progress Notes (Signed)
Message left on pt's home phone and cell phone concerning surg time change. Instructed to arrive at short stay at 11:15 am, and stop clear liquids at 8:20 am

## 2015-02-23 NOTE — H&P (Signed)
TOTAL KNEE ADMISSION H&P  Patient is being admitted for right total knee arthroplasty and left knee cortisone injection.  Subjective:  Chief Complaint:  Bilaterally knee primary OA / pain  HPI: [name], 59 y.o. female, has a history of pain and functional disability in the bilaterally knee due to arthritis and has failed non-surgical conservative treatments for greater than 12 weeks to include NSAID's and/or analgesics, corticosteriod injections, use of assistive devices and activity modification.  Onset of symptoms was gradual, starting 5+ years ago with gradually worsening course since that time. The patient noted no past surgery on the bilateral knee(s).  Patient currently rates pain in the right knee(s) at 10 out of 10 with activity.  Patient currently rates pain in the left knee at 7 out of 10 with activity.  She feels that she will have difficulty with progressing with PT on the right knee after surgery if she doesn't receive a cortisone injection to alleviate some of the pain.   Patient has night pain, worsening of pain with activity and weight bearing, pain that interferes with activities of daily living, pain with passive range of motion, crepitus and joint swelling.  Patient has evidence of periarticular osteophytes and joint space narrowing by imaging studies.  There is no active infection.  Risks, benefits and expectations were discussed with the patient.  Risks including but not limited to the risk of anesthesia, blood clots, nerve damage, blood vessel damage, failure of the prosthesis, infection and up to and including death.  Patient understand the risks, benefits and expectations and wishes to proceed with surgery.   PCP: Georgianne Fick, MD  D/C Plans:      Home with HHPT  Post-op Meds:       No Rx given   Tranexamic Acid:      To be given - IV  Decadron:      Is to be given  FYI:     ASA post-op  Oxycodone post-op    Patient Active Problem List   Diagnosis Date  Noted  . Lumbar surgical wound fluid collection   . Wound drainage   . Staphylococcus aureus infection   . Screen for STD (sexually transmitted disease)   . Wound infection after surgery 04/24/2014  . Spinal stenosis of lumbar region with neurogenic claudication 03/30/2014   Past Medical History  Diagnosis Date  . Hypertension   . Hypothyroidism   . Anxiety   . GERD (gastroesophageal reflux disease)   . Arthritis   . Fibromyalgia   . Restless leg syndrome   . Complication of anesthesia     during surgery she could hear staff talking ans could feel them preping her arm over 30 yrs. ago    Past Surgical History  Procedure Laterality Date  . Abdominal hysterectomy  1981    partial  . Cervical fusion  1990  . Cervical fusion      2000  . Bladder suspension      2004  . Back surgery  2007  . Foot surgery Right 2009    reconstruction with hardware  . Foot surgery Left 2013    reconstruction with hardware  . Cyst excision Left 1985    wrist  . Cyst excision  1985    chest  . Lumbar wound debridement N/A 04/25/2014    Procedure: LUMBAR WOUND DEBRIDEMENT Placement of Woundvac);  Surgeon: [name], MD;  Location: [facility];  Service: Neurosurgery;  Laterality: N/A;  . Application of wound  vac N/A 04/25/2014    Procedure: APPLICATION OF WOUND VAC;  Surgeon: [name], MD;  Location: [facility];  Service: Neurosurgery;  Laterality: N/A;  . Lumbar fusion  03/2014    No prescriptions prior to admission   Allergies  Allergen Reactions  . Clindamycin/Lincomycin Rash  . Influenza Vaccines Hives  . Lodine [Etodolac] Rash    Social History  Substance Use Topics  . Smoking status: Never Smoker   . Smokeless tobacco: Not on file  . Alcohol Use: No       Review of Systems  Constitutional: Negative.   HENT: Negative.   Eyes: Negative.   Respiratory: Negative.   Cardiovascular: Negative.   Gastrointestinal: Positive for heartburn.  Genitourinary:  Negative.   Musculoskeletal: Positive for joint pain.  Skin: Negative.   Neurological: Negative.   Endo/Heme/Allergies: Negative.   Psychiatric/Behavioral: The patient is nervous/anxious.     Objective:  Physical Exam  Constitutional: She is oriented to person, place, and time. She appears well-developed and well-nourished.  HENT:  Head: Normocephalic and atraumatic.  Eyes: Pupils are equal, round, and reactive to light.  Neck: Neck supple. No tracheal deviation present. No thyromegaly present.  Cardiovascular: Normal rate, regular rhythm, normal heart sounds and intact distal pulses.   Respiratory: Effort normal and breath sounds normal. No stridor. No respiratory distress. She has no wheezes.  GI: Soft. There is no tenderness. There is no guarding.  Musculoskeletal:       Right knee: She exhibits decreased range of motion, swelling and bony tenderness. She exhibits no ecchymosis, no deformity, no laceration and no erythema. Tenderness found.  Lymphadenopathy:    She has no cervical adenopathy.  Neurological: She is alert and oriented to person, place, and time. A sensory deficit (occasional numbness / tingling in the left foot) is present.  Skin: Skin is warm and dry.  Psychiatric: She has a normal mood and affect.     Labs:  Estimated body mass index is 38.94 kg/(m^2) as calculated from the following:   Height as of 05/25/14: 5\' 5"  (1.651 m).   Weight as of 05/25/14: 106.142 kg (234 lb).   Imaging Review Plain radiographs demonstrate severe degenerative joint disease of the right knee(s). The overall alignment is mild valgus. The bone quality appears to be good for age and reported activity level.  Assessment/Plan:  End stage arthritis, right knee   The patient history, physical examination, clinical judgment of the provider and imaging studies are consistent with end stage degenerative joint disease of the right knee(s) and total knee arthroplasty is deemed medically  necessary. The treatment options including medical management, injection therapy arthroscopy and arthroplasty were discussed at length. The risks and benefits of total knee arthroplasty were presented and reviewed. The risks due to aseptic loosening, infection, stiffness, patella tracking problems, thromboembolic complications and other imponderables were discussed. The patient acknowledged the explanation, agreed to proceed with the plan and consent was signed. Patient is being admitted for inpatient treatment for surgery, pain control, PT, OT, prophylactic antibiotics, VTE prophylaxis, progressive ambulation and ADL's and discharge planning. The patient is planning to be discharged home with home health services.     14/3/15 Darleen Moffitt   PA-C  02/23/2015, 7:39 AM

## 2015-02-26 LAB — TYPE AND SCREEN
ABO/RH(D): O POS
Antibody Screen: NEGATIVE

## 2015-02-27 ENCOUNTER — Inpatient Hospital Stay (HOSPITAL_COMMUNITY): Payer: BLUE CROSS/BLUE SHIELD | Admitting: Anesthesiology

## 2015-02-27 ENCOUNTER — Encounter (HOSPITAL_COMMUNITY): Payer: Self-pay | Admitting: *Deleted

## 2015-02-27 ENCOUNTER — Encounter (HOSPITAL_COMMUNITY)
Admission: RE | Disposition: A | Discharge status: Home Health | Payer: Self-pay | Source: Ambulatory Visit | Attending: Orthopedic Surgery

## 2015-02-27 ENCOUNTER — Inpatient Hospital Stay (HOSPITAL_COMMUNITY)
Admission: RE | Admit: 2015-02-27 | Discharge: 2015-02-28 | DRG: 470 | Disposition: A | Discharge status: Home Health | Payer: BLUE CROSS/BLUE SHIELD | Source: Ambulatory Visit | Attending: Orthopedic Surgery | Admitting: Orthopedic Surgery

## 2015-02-27 DIAGNOSIS — Z90710 Acquired absence of both cervix and uterus: Secondary | ICD-10-CM

## 2015-02-27 DIAGNOSIS — M659 Synovitis and tenosynovitis, unspecified: Secondary | ICD-10-CM | POA: Diagnosis present

## 2015-02-27 DIAGNOSIS — Z6839 Body mass index (BMI) 39.0-39.9, adult: Secondary | ICD-10-CM

## 2015-02-27 DIAGNOSIS — M797 Fibromyalgia: Secondary | ICD-10-CM | POA: Diagnosis present

## 2015-02-27 DIAGNOSIS — Z981 Arthrodesis status: Secondary | ICD-10-CM

## 2015-02-27 DIAGNOSIS — E669 Obesity, unspecified: Secondary | ICD-10-CM | POA: Diagnosis present

## 2015-02-27 DIAGNOSIS — M1711 Unilateral primary osteoarthritis, right knee: Secondary | ICD-10-CM | POA: Diagnosis present

## 2015-02-27 DIAGNOSIS — I10 Essential (primary) hypertension: Secondary | ICD-10-CM | POA: Diagnosis present

## 2015-02-27 DIAGNOSIS — M25569 Pain in unspecified knee: Secondary | ICD-10-CM | POA: Diagnosis present

## 2015-02-27 DIAGNOSIS — Z96651 Presence of right artificial knee joint: Secondary | ICD-10-CM

## 2015-02-27 DIAGNOSIS — E039 Hypothyroidism, unspecified: Secondary | ICD-10-CM | POA: Diagnosis present

## 2015-02-27 DIAGNOSIS — G2581 Restless legs syndrome: Secondary | ICD-10-CM | POA: Diagnosis present

## 2015-02-27 DIAGNOSIS — K219 Gastro-esophageal reflux disease without esophagitis: Secondary | ICD-10-CM | POA: Diagnosis present

## 2015-02-27 DIAGNOSIS — Z01812 Encounter for preprocedural laboratory examination: Secondary | ICD-10-CM

## 2015-02-27 DIAGNOSIS — Z96659 Presence of unspecified artificial knee joint: Secondary | ICD-10-CM

## 2015-02-27 HISTORY — PX: TOTAL KNEE ARTHROPLASTY: SHX125

## 2015-02-27 SURGERY — ARTHROPLASTY, KNEE, TOTAL
Anesthesia: General | Site: Knee | Laterality: Bilateral

## 2015-02-27 MED ORDER — ESTRADIOL 1 MG PO TABS
0.5000 mg | ORAL_TABLET | Freq: Every day | ORAL | Status: DC
  Administered 2015-02-27: 0.5 mg via ORAL
  Filled 2015-02-27 (×2): qty 0.5

## 2015-02-27 MED ORDER — HYDROMORPHONE HCL 1 MG/ML IJ SOLN
INTRAMUSCULAR | Status: AC
  Filled 2015-02-27: qty 1

## 2015-02-27 MED ORDER — METOCLOPRAMIDE HCL 10 MG PO TABS: 5.0000 mg | ORAL_TABLET | Freq: Three times a day (TID) | ORAL | Status: DC | PRN

## 2015-02-27 MED ORDER — SODIUM CHLORIDE 0.9 % IV SOLN
INTRAVENOUS | Status: DC
  Administered 2015-02-27 – 2015-02-28 (×2): via INTRAVENOUS
  Filled 2015-02-27 (×6): qty 1000

## 2015-02-27 MED ORDER — METHYLPREDNISOLONE ACETATE 40 MG/ML IJ SUSP
INTRAMUSCULAR | Status: DC | PRN
  Administered 2015-02-27: 80 mg via INTRA_ARTICULAR

## 2015-02-27 MED ORDER — LACTATED RINGERS IV SOLN: INTRAVENOUS | Status: DC

## 2015-02-27 MED ORDER — HYDROMORPHONE HCL 1 MG/ML IJ SOLN
0.2500 mg | INTRAMUSCULAR | Status: DC | PRN
Start: 2015-02-27 — End: 2015-02-27
  Administered 2015-02-27 (×2): 0.5 mg via INTRAVENOUS

## 2015-02-27 MED ORDER — SUCCINYLCHOLINE CHLORIDE 20 MG/ML IJ SOLN
INTRAMUSCULAR | Status: DC | PRN
  Administered 2015-02-27: 100 mg via INTRAVENOUS

## 2015-02-27 MED ORDER — BISACODYL 10 MG RE SUPP: 10.0000 mg | Freq: Every day | RECTAL | Status: DC | PRN

## 2015-02-27 MED ORDER — CEFAZOLIN SODIUM-DEXTROSE 2-3 GM-% IV SOLR
INTRAVENOUS | Status: AC
  Filled 2015-02-27: qty 50

## 2015-02-27 MED ORDER — DOXYCYCLINE HYCLATE 100 MG PO TABS: 100.0000 mg | ORAL_TABLET | Freq: Two times a day (BID) | ORAL | Status: DC

## 2015-02-27 MED ORDER — PHENOL 1.4 % MT LIQD: 1.0000 | OROMUCOSAL | Status: DC | PRN

## 2015-02-27 MED ORDER — METOCLOPRAMIDE HCL 5 MG/ML IJ SOLN: 5.0000 mg | Freq: Three times a day (TID) | INTRAMUSCULAR | Status: DC | PRN

## 2015-02-27 MED ORDER — ONDANSETRON HCL 4 MG/2ML IJ SOLN
INTRAMUSCULAR | Status: AC
  Filled 2015-02-27: qty 2

## 2015-02-27 MED ORDER — MEPERIDINE HCL 50 MG/ML IJ SOLN: 6.2500 mg | INTRAMUSCULAR | Status: DC | PRN

## 2015-02-27 MED ORDER — METHOCARBAMOL 500 MG PO TABS
500.0000 mg | ORAL_TABLET | Freq: Four times a day (QID) | ORAL | Status: DC | PRN
  Administered 2015-02-27 – 2015-02-28 (×3): 500 mg via ORAL
  Filled 2015-02-27 (×3): qty 1

## 2015-02-27 MED ORDER — POLYETHYLENE GLYCOL 3350 17 G PO PACK
17.0000 g | PACK | Freq: Two times a day (BID) | ORAL | Status: DC
  Administered 2015-02-27 – 2015-02-28 (×2): 17 g via ORAL
  Filled 2015-02-27 (×3): qty 1

## 2015-02-27 MED ORDER — PROPOFOL 10 MG/ML IV BOLUS
INTRAVENOUS | Status: DC | PRN
  Administered 2015-02-27: 150 mg via INTRAVENOUS

## 2015-02-27 MED ORDER — CELECOXIB 200 MG PO CAPS
200.0000 mg | ORAL_CAPSULE | Freq: Two times a day (BID) | ORAL | Status: DC
  Administered 2015-02-27 – 2015-02-28 (×2): 200 mg via ORAL
  Filled 2015-02-27 (×3): qty 1

## 2015-02-27 MED ORDER — SODIUM CHLORIDE 0.9 % IJ SOLN
INTRAMUSCULAR | Status: DC | PRN
  Administered 2015-02-27: 30 mL

## 2015-02-27 MED ORDER — LEVOTHYROXINE SODIUM 200 MCG PO TABS
200.0000 ug | ORAL_TABLET | ORAL | Status: DC
  Administered 2015-02-27: 200 ug via ORAL
  Filled 2015-02-27 (×2): qty 1

## 2015-02-27 MED ORDER — LOSARTAN POTASSIUM 50 MG PO TABS
100.0000 mg | ORAL_TABLET | Freq: Every day | ORAL | Status: DC
  Administered 2015-02-28: 100 mg via ORAL
  Filled 2015-02-27: qty 2

## 2015-02-27 MED ORDER — ONDANSETRON HCL 4 MG/2ML IJ SOLN
INTRAMUSCULAR | Status: DC | PRN
  Administered 2015-02-27: 4 mg via INTRAVENOUS

## 2015-02-27 MED ORDER — BUPIVACAINE-EPINEPHRINE (PF) 0.25% -1:200000 IJ SOLN
INTRAMUSCULAR | Status: AC
  Filled 2015-02-27: qty 60

## 2015-02-27 MED ORDER — LOSARTAN POTASSIUM-HCTZ 100-12.5 MG PO TABS: 1.0000 | ORAL_TABLET | Freq: Every morning | ORAL | Status: DC

## 2015-02-27 MED ORDER — HYDROMORPHONE HCL 1 MG/ML IJ SOLN
INTRAMUSCULAR | Status: DC | PRN
  Administered 2015-02-27 (×2): .4 mg via INTRAVENOUS
  Administered 2015-02-27: .2 mg via INTRAVENOUS
  Administered 2015-02-27: .4 mg via INTRAVENOUS
  Administered 2015-02-27 (×3): .2 mg via INTRAVENOUS

## 2015-02-27 MED ORDER — TRANEXAMIC ACID 1000 MG/10ML IV SOLN
1000.0000 mg | Freq: Once | INTRAVENOUS | Status: AC
  Administered 2015-02-27: 1000 mg via INTRAVENOUS
  Filled 2015-02-27: qty 10

## 2015-02-27 MED ORDER — HYDROCHLOROTHIAZIDE 12.5 MG PO CAPS
12.5000 mg | ORAL_CAPSULE | Freq: Every day | ORAL | Status: DC
  Administered 2015-02-28: 12.5 mg via ORAL
  Filled 2015-02-27 (×2): qty 1

## 2015-02-27 MED ORDER — METHOCARBAMOL 1000 MG/10ML IJ SOLN
500.0000 mg | Freq: Four times a day (QID) | INTRAMUSCULAR | Status: DC | PRN
  Administered 2015-02-27: 500 mg via INTRAVENOUS
  Filled 2015-02-27 (×2): qty 5

## 2015-02-27 MED ORDER — DEXAMETHASONE SODIUM PHOSPHATE 10 MG/ML IJ SOLN
INTRAMUSCULAR | Status: AC
  Filled 2015-02-27: qty 1

## 2015-02-27 MED ORDER — ALUM & MAG HYDROXIDE-SIMETH 200-200-20 MG/5ML PO SUSP: 30.0000 mL | ORAL | Status: DC | PRN

## 2015-02-27 MED ORDER — ASPIRIN EC 325 MG PO TBEC
325.0000 mg | DELAYED_RELEASE_TABLET | Freq: Two times a day (BID) | ORAL | Status: DC
  Administered 2015-02-28: 325 mg via ORAL
  Filled 2015-02-27 (×3): qty 1

## 2015-02-27 MED ORDER — ONDANSETRON HCL 4 MG PO TABS: 4.0000 mg | ORAL_TABLET | Freq: Four times a day (QID) | ORAL | Status: DC | PRN

## 2015-02-27 MED ORDER — SODIUM CHLORIDE 0.9 % IJ SOLN
INTRAMUSCULAR | Status: AC
  Filled 2015-02-27: qty 50

## 2015-02-27 MED ORDER — FERROUS SULFATE 325 (65 FE) MG PO TABS
325.0000 mg | ORAL_TABLET | Freq: Three times a day (TID) | ORAL | Status: DC
  Filled 2015-02-27 (×4): qty 1

## 2015-02-27 MED ORDER — SODIUM CHLORIDE 0.9 % IR SOLN
Status: DC | PRN
  Administered 2015-02-27: 1000 mL

## 2015-02-27 MED ORDER — CEFAZOLIN SODIUM-DEXTROSE 2-3 GM-% IV SOLR
2.0000 g | INTRAVENOUS | Status: AC
  Administered 2015-02-27: 2 g via INTRAVENOUS

## 2015-02-27 MED ORDER — LACTATED RINGERS IV SOLN
INTRAVENOUS | Status: DC | PRN
  Administered 2015-02-27 (×2): via INTRAVENOUS

## 2015-02-27 MED ORDER — MAGNESIUM CITRATE PO SOLN: 1.0000 | Freq: Once | ORAL | Status: DC | PRN

## 2015-02-27 MED ORDER — DEXAMETHASONE SODIUM PHOSPHATE 10 MG/ML IJ SOLN
10.0000 mg | Freq: Once | INTRAMUSCULAR | Status: AC
  Administered 2015-02-28: 10 mg via INTRAVENOUS
  Filled 2015-02-27: qty 1

## 2015-02-27 MED ORDER — PROPOFOL 10 MG/ML IV BOLUS
INTRAVENOUS | Status: AC
  Filled 2015-02-27: qty 20

## 2015-02-27 MED ORDER — FENTANYL CITRATE (PF) 250 MCG/5ML IJ SOLN
INTRAMUSCULAR | Status: AC
  Filled 2015-02-27: qty 25

## 2015-02-27 MED ORDER — MENTHOL 3 MG MT LOZG: 1.0000 | LOZENGE | OROMUCOSAL | Status: DC | PRN

## 2015-02-27 MED ORDER — HYDROCODONE-ACETAMINOPHEN 7.5-325 MG PO TABS
1.0000 | ORAL_TABLET | ORAL | Status: DC
  Administered 2015-02-27 – 2015-02-28 (×4): 2 via ORAL
  Filled 2015-02-27 (×4): qty 2

## 2015-02-27 MED ORDER — DOCUSATE SODIUM 100 MG PO CAPS
100.0000 mg | ORAL_CAPSULE | Freq: Two times a day (BID) | ORAL | Status: DC
  Administered 2015-02-27 – 2015-02-28 (×2): 100 mg via ORAL
  Filled 2015-02-27 (×3): qty 1

## 2015-02-27 MED ORDER — VANCOMYCIN HCL IN DEXTROSE 1-5 GM/200ML-% IV SOLN
1000.0000 mg | Freq: Once | INTRAVENOUS | Status: AC
  Administered 2015-02-27: 1000 mg via INTRAVENOUS

## 2015-02-27 MED ORDER — BUPIVACAINE-EPINEPHRINE (PF) 0.25% -1:200000 IJ SOLN
INTRAMUSCULAR | Status: AC
  Filled 2015-02-27: qty 30

## 2015-02-27 MED ORDER — DIPHENHYDRAMINE HCL 25 MG PO CAPS: 25.0000 mg | ORAL_CAPSULE | Freq: Four times a day (QID) | ORAL | Status: DC | PRN

## 2015-02-27 MED ORDER — NON FORMULARY: 20.0000 mg | Freq: Every day | Status: DC

## 2015-02-27 MED ORDER — PROMETHAZINE HCL 25 MG/ML IJ SOLN: 6.2500 mg | INTRAMUSCULAR | Status: DC | PRN

## 2015-02-27 MED ORDER — FENTANYL CITRATE (PF) 250 MCG/5ML IJ SOLN
INTRAMUSCULAR | Status: DC | PRN
  Administered 2015-02-27 (×3): 50 ug via INTRAVENOUS
  Administered 2015-02-27: 100 ug via INTRAVENOUS

## 2015-02-27 MED ORDER — LORATADINE 10 MG PO TABS
10.0000 mg | ORAL_TABLET | Freq: Every day | ORAL | Status: DC
  Administered 2015-02-28: 10 mg via ORAL
  Filled 2015-02-27: qty 1

## 2015-02-27 MED ORDER — HYDROMORPHONE HCL 1 MG/ML IJ SOLN
0.2500 mg | INTRAMUSCULAR | Status: DC | PRN
  Administered 2015-02-27 (×4): 0.5 mg via INTRAVENOUS

## 2015-02-27 MED ORDER — MIDAZOLAM HCL 5 MG/5ML IJ SOLN
INTRAMUSCULAR | Status: DC | PRN
  Administered 2015-02-27: 2 mg via INTRAVENOUS

## 2015-02-27 MED ORDER — DEXAMETHASONE SODIUM PHOSPHATE 10 MG/ML IJ SOLN
10.0000 mg | Freq: Once | INTRAMUSCULAR | Status: AC
  Administered 2015-02-27: 10 mg via INTRAVENOUS

## 2015-02-27 MED ORDER — PRAMIPEXOLE DIHYDROCHLORIDE 0.125 MG PO TABS
0.1250 mg | ORAL_TABLET | Freq: Three times a day (TID) | ORAL | Status: DC
  Administered 2015-02-27 – 2015-02-28 (×3): 0.125 mg via ORAL
  Filled 2015-02-27 (×4): qty 1

## 2015-02-27 MED ORDER — MIDAZOLAM HCL 2 MG/2ML IJ SOLN
INTRAMUSCULAR | Status: AC
  Filled 2015-02-27: qty 2

## 2015-02-27 MED ORDER — VANCOMYCIN HCL IN DEXTROSE 1-5 GM/200ML-% IV SOLN
INTRAVENOUS | Status: AC
  Filled 2015-02-27: qty 200

## 2015-02-27 MED ORDER — CEFAZOLIN SODIUM-DEXTROSE 2-3 GM-% IV SOLR
2.0000 g | Freq: Four times a day (QID) | INTRAVENOUS | Status: AC
  Administered 2015-02-27 – 2015-02-28 (×2): 2 g via INTRAVENOUS
  Filled 2015-02-27 (×2): qty 50

## 2015-02-27 MED ORDER — BUPIVACAINE-EPINEPHRINE (PF) 0.25% -1:200000 IJ SOLN
INTRAMUSCULAR | Status: DC | PRN
Start: 2015-02-27 — End: 2015-02-27
  Administered 2015-02-27: 30 mL
  Administered 2015-02-27: 6 mL

## 2015-02-27 MED ORDER — HYDROMORPHONE HCL 2 MG/ML IJ SOLN
INTRAMUSCULAR | Status: AC
  Filled 2015-02-27: qty 1

## 2015-02-27 MED ORDER — BUPROPION HCL ER (SMOKING DET) 150 MG PO TB12: 450.0000 mg | ORAL_TABLET | Freq: Every morning | ORAL | Status: DC

## 2015-02-27 MED ORDER — ESOMEPRAZOLE MAGNESIUM 20 MG PO CPDR
20.0000 mg | DELAYED_RELEASE_CAPSULE | Freq: Every day | ORAL | Status: DC
  Administered 2015-02-28: 20 mg via ORAL
  Filled 2015-02-27 (×2): qty 1

## 2015-02-27 MED ORDER — ALPRAZOLAM 0.25 MG PO TABS
0.2500 mg | ORAL_TABLET | Freq: Every day | ORAL | Status: DC | PRN
  Administered 2015-02-27: 0.25 mg via ORAL
  Filled 2015-02-27: qty 1

## 2015-02-27 MED ORDER — BUPROPION HCL ER (XL) 300 MG PO TB24
450.0000 mg | ORAL_TABLET | Freq: Every day | ORAL | Status: DC
  Administered 2015-02-28: 450 mg via ORAL
  Filled 2015-02-27: qty 1

## 2015-02-27 MED ORDER — HYDROMORPHONE HCL 1 MG/ML IJ SOLN
0.5000 mg | INTRAMUSCULAR | Status: DC | PRN
  Administered 2015-02-27: 1 mg via INTRAVENOUS
  Administered 2015-02-27 – 2015-02-28 (×2): 2 mg via INTRAVENOUS
  Filled 2015-02-27 (×4): qty 2

## 2015-02-27 MED ORDER — ONDANSETRON HCL 4 MG/2ML IJ SOLN
4.0000 mg | Freq: Four times a day (QID) | INTRAMUSCULAR | Status: DC | PRN
  Administered 2015-02-28: 4 mg via INTRAVENOUS
  Filled 2015-02-27: qty 2

## 2015-02-27 MED ORDER — METHYLPREDNISOLONE ACETATE 40 MG/ML IJ SUSP
INTRAMUSCULAR | Status: AC
  Filled 2015-02-27: qty 2

## 2015-02-27 SURGICAL SUPPLY — 57 items
BAG DECANTER FOR FLEXI CONT (MISCELLANEOUS) IMPLANT
BAG SPEC THK2 15X12 ZIP CLS (MISCELLANEOUS) ×1
BAG ZIPLOCK 12X15 (MISCELLANEOUS) ×2 IMPLANT
BANDAGE ELASTIC 6 VELCRO ST LF (GAUZE/BANDAGES/DRESSINGS) ×2 IMPLANT
BANDAGE ESMARK 6X9 LF (GAUZE/BANDAGES/DRESSINGS) ×1 IMPLANT
BLADE SAW SGTL 13.0X1.19X90.0M (BLADE) ×2 IMPLANT
BNDG ADH 5X2 AIR PERM ELC (GAUZE/BANDAGES/DRESSINGS) ×1
BNDG CMPR 9X6 STRL LF SNTH (GAUZE/BANDAGES/DRESSINGS) ×1
BNDG COHESIVE 2X5 WHT NS (GAUZE/BANDAGES/DRESSINGS) ×2 IMPLANT
BNDG ESMARK 6X9 LF (GAUZE/BANDAGES/DRESSINGS) ×2
BOWL SMART MIX CTS (DISPOSABLE) ×2 IMPLANT
CAPT KNEE TOTAL 3 ATTUNE ×2 IMPLANT
CEMENT HV SMART SET (Cement) ×4 IMPLANT
CUFF TOURN SGL QUICK 34 (TOURNIQUET CUFF) ×2
CUFF TRNQT CYL 34X4X40X1 (TOURNIQUET CUFF) ×1 IMPLANT
DECANTER SPIKE VIAL GLASS SM (MISCELLANEOUS) ×2 IMPLANT
DRAPE EXTREMITY T 121X128X90 (DRAPE) ×2 IMPLANT
DRAPE POUCH INSTRU U-SHP 10X18 (DRAPES) ×2 IMPLANT
DRAPE U-SHAPE 47X51 STRL (DRAPES) ×2 IMPLANT
DRSG AQUACEL AG ADV 3.5X10 (GAUZE/BANDAGES/DRESSINGS) ×2 IMPLANT
DURAPREP 26ML APPLICATOR (WOUND CARE) ×4 IMPLANT
ELECT REM PT RETURN 9FT ADLT (ELECTROSURGICAL) ×2
ELECTRODE REM PT RTRN 9FT ADLT (ELECTROSURGICAL) ×1 IMPLANT
FACESHIELD WRAPAROUND (MASK) ×10 IMPLANT
GLOVE BIOGEL PI IND STRL 7.5 (GLOVE) ×1 IMPLANT
GLOVE BIOGEL PI IND STRL 8.5 (GLOVE) ×1 IMPLANT
GLOVE BIOGEL PI INDICATOR 7.5 (GLOVE) ×1
GLOVE BIOGEL PI INDICATOR 8.5 (GLOVE) ×1
GLOVE ECLIPSE 8.0 STRL XLNG CF (GLOVE) ×2 IMPLANT
GLOVE ORTHO TXT STRL SZ7.5 (GLOVE) ×4 IMPLANT
GOWN SPEC L3 XXLG W/TWL (GOWN DISPOSABLE) ×2 IMPLANT
GOWN STRL REUS W/TWL LRG LVL3 (GOWN DISPOSABLE) ×2 IMPLANT
HANDPIECE INTERPULSE COAX TIP (DISPOSABLE) ×2
KIT BASIN OR (CUSTOM PROCEDURE TRAY) ×2 IMPLANT
LIQUID BAND (GAUZE/BANDAGES/DRESSINGS) ×2 IMPLANT
MANIFOLD NEPTUNE II (INSTRUMENTS) ×2 IMPLANT
NDL SAFETY ECLIPSE 18X1.5 (NEEDLE) ×1 IMPLANT
NEEDLE HYPO 18GX1.5 SHARP (NEEDLE) ×2
PACK TOTAL JOINT (CUSTOM PROCEDURE TRAY) ×2 IMPLANT
PEN SKIN MARKING BROAD (MISCELLANEOUS) ×2 IMPLANT
POSITIONER SURGICAL ARM (MISCELLANEOUS) ×2 IMPLANT
SET HNDPC FAN SPRY TIP SCT (DISPOSABLE) ×1 IMPLANT
SET PAD KNEE POSITIONER (MISCELLANEOUS) ×2 IMPLANT
SUCTION FRAZIER 12FR DISP (SUCTIONS) ×2 IMPLANT
SUT MNCRL AB 4-0 PS2 18 (SUTURE) ×2 IMPLANT
SUT VIC AB 1 CT1 36 (SUTURE) ×2 IMPLANT
SUT VIC AB 2-0 CT1 27 (SUTURE) ×6
SUT VIC AB 2-0 CT1 TAPERPNT 27 (SUTURE) ×3 IMPLANT
SUT VLOC 180 0 24IN GS25 (SUTURE) ×2 IMPLANT
SYR 50ML LL SCALE MARK (SYRINGE) ×2 IMPLANT
TOWEL OR 17X26 10 PK STRL BLUE (TOWEL DISPOSABLE) ×2 IMPLANT
TOWEL OR NON WOVEN STRL DISP B (DISPOSABLE) ×2 IMPLANT
TRAY FOLEY W/METER SILVER 14FR (SET/KITS/TRAYS/PACK) ×2 IMPLANT
TRAY FOLEY W/METER SILVER 16FR (SET/KITS/TRAYS/PACK) IMPLANT
WATER STERILE IRR 1500ML POUR (IV SOLUTION) ×2 IMPLANT
WRAP KNEE MAXI GEL POST OP (GAUZE/BANDAGES/DRESSINGS) IMPLANT
YANKAUER SUCT BULB TIP 10FT TU (MISCELLANEOUS) ×2 IMPLANT

## 2015-02-27 NOTE — Op Note (Signed)
NAME:  [name]                      MEDICAL RECORD NO.:  [id number]                             FACILITY:  [facility]      PHYSICIAN:  Madlyn Frankel. Charlann Boxer, M.D.  DATE OF BIRTH:  Apr 28, 1956      DATE OF PROCEDURE:  02/27/2015                                     OPERATIVE REPORT         PREOPERATIVE DIAGNOSIS:  Right knee osteoarthritis.      POSTOPERATIVE DIAGNOSIS:  Right knee osteoarthritis.      FINDINGS:  The patient was noted to have complete loss of cartilage and   bone-on-bone arthritis with associated osteophytes in the lateral and patellofemoral compartments of   the knee with a significant synovitis and associated effusion.      PROCEDURE:  Right total knee replacement.      COMPONENTS USED:  DePuy Attune rotating platform posterior stabilized knee   system, a size 5 femur, 5 tibia, 7 mm PS AOX insert, and 35 anatomic patellar   button.      SURGEON:  Madlyn Frankel. Charlann Boxer, M.D.      ASSISTANT:  Skip Mayer, PA-C.      ANESTHESIA:  General.      SPECIMENS:  None.      COMPLICATION:  None.      DRAINS:  None.  EBL: <75cc      TOURNIQUET TIME:   Total Tourniquet Time Documented: Thigh (Right) - 38 minutes Total: Thigh (Right) - 38 minutes  .      The patient was stable to the recovery room.      INDICATION FOR PROCEDURE:  JUSTENE HINCHCLIFFE is a 59 y.o. female patient of   mine.  The patient had been seen, evaluated, and treated conservatively in the   office with medication, activity modification, and injections.  The patient had   radiographic changes of bone-on-bone arthritis with endplate sclerosis and osteophytes noted.      The patient failed conservative measures including medication, injections, and activity modification, and at this point was ready for more definitive measures.   Based on the radiographic changes and failed conservative measures, the patient   decided to proceed with total knee replacement.  Risks of infection,   DVT, component failure,  need for revision surgery, postop course, and   expectations were all   discussed and reviewed.  Consent was obtained for benefit of pain   relief.      PROCEDURE IN DETAIL:  The patient was brought to the operative theater.   Once adequate anesthesia, preoperative antibiotics, 2 gm of Ancef, 1 gm of Tranexamic Acid, and 10mg  of Decadron administered, the patient was positioned supine with the right thigh tourniquet placed.  The  right lower extremity was prepped and draped in sterile fashion.  A time-   out was performed identifying the patient, planned procedure, and   extremity.      The right lower extremity was placed in the [facility] leg holder.  The leg was   exsanguinated, tourniquet elevated to 250 mmHg.  A midline incision was   made followed by  median parapatellar arthrotomy.  Following initial   exposure, attention was first directed to the patella.  Precut   measurement was noted to be 24 mm.  I resected down to 14 mm and used a   35 patellar button to restore patellar height as well as cover the cut   surface.      The lug holes were drilled and a metal shim was placed to protect the   patella from retractors and saw blades.      At this point, attention was now directed to the femur.  The femoral   canal was opened with a drill, irrigated to try to prevent fat emboli.  An   intramedullary rod was passed at 3 degrees valgus, 9 mm of bone was   resected off the distal femur.  Following this resection, the tibia was   subluxated anteriorly.  Using the extramedullary guide, 2 mm of bone was resected off   the proximal lateral tibia.  We confirmed the gap would be   stable medially and laterally with a size 5 mm insert as well as confirmed   the cut was perpendicular in the coronal plane, checking with an alignment rod.      Once this was done, I sized the femur to be a size 5 in the anterior-   posterior dimension, chose a standard component based on medial and   lateral  dimension.  The size 5 rotation block was then pinned in   position anterior referenced using the C-clamp to set rotation.  The   anterior, posterior, and  chamfer cuts were made without difficulty nor   notching making certain that I was along the anterior cortex to help   with flexion gap stability.      The final box cut was made off the lateral aspect of distal femur.      At this point, the tibia was sized to be a size 5, the size 5 tray was   then pinned in position through the medial third of the tubercle,   drilled, and keel punched.  Trial reduction was now carried with a 5 femur,  5 tibia, a size 6 then 7 mm PS insert, and the 35 patella botton.  The knee was brought to   extension, full extension with good flexion stability with the patella   tracking through the trochlea without application of pressure.  Given   all these findings, the trial components removed.  Final components were   opened and cement was mixed.  The knee was irrigated with normal saline   solution and pulse lavage.  The synovial lining was   then injected with 30cc of  0.25% Marcaine with epinephrine and 1 cc of Toradol plus 30cc of NS for a   total of 61 cc.      The knee was irrigated.  Final implants were then cemented onto clean and   dried cut surfaces of bone with the knee brought to extension with a size 7 mm trial insert.      Once the cement had fully cured, the excess cement was removed   throughout the knee.  I confirmed I was satisfied with the range of   motion and stability, and the final size 7 mm PS AOX insert was chosen.  It was   placed into the knee.      The tourniquet had been let down at 38 minutes.  No significant   hemostasis required.  The  extensor mechanism was then reapproximated using #1 Vicryl and #0 V-lock sutures with the knee   in flexion.  The   remaining wound was closed with 2-0 Vicryl and running 4-0 Monocryl.   The knee was cleaned, dried, dressed sterilely using  Dermabond and   Aquacel dressing.  The patient was then   brought to recovery room in stable condition, tolerating the procedure   well.   Please note that Physician Assistant, Skip Mayer, PA-C, was present for the entirety of the case, and was utilized for pre-operative positioning, peri-operative retractor management, general facilitation of the procedure.  He was also utilized for primary wound closure at the end of the case.              Madlyn Frankel Charlann Boxer, M.D.    02/27/2015 3:43 PM

## 2015-02-27 NOTE — Anesthesia Procedure Notes (Signed)
Procedure Name: Intubation Date/Time: 02/27/2015 1:49 PM Performed by: Delphia Grates Pre-anesthesia Checklist: Emergency Drugs available, Suction available, Patient being monitored and Patient identified Patient Re-evaluated:Patient Re-evaluated prior to inductionOxygen Delivery Method: Circle system utilized Preoxygenation: Pre-oxygenation with 100% oxygen Intubation Type: IV induction Laryngoscope Size: Mac and 4 Grade View: Grade I Tube type: Oral Tube size: 7.5 mm Number of attempts: 1 Airway Equipment and Method: Stylet Placement Confirmation: ETT inserted through vocal cords under direct vision,  breath sounds checked- equal and bilateral and positive ETCO2 Secured at: 21 cm Tube secured with: Tape Dental Injury: Teeth and Oropharynx as per pre-operative assessment

## 2015-02-27 NOTE — Anesthesia Preprocedure Evaluation (Signed)
Anesthesia Evaluation  Patient identified by MRN, date of birth, ID band Patient awake    Reviewed: Allergy & Precautions, NPO status , Patient's Chart, lab work & pertinent test results  Airway Mallampati: II  TM Distance: >3 FB Neck ROM: Full    Dental no notable dental hx. (+) Caps   Pulmonary neg pulmonary ROS,  breath sounds clear to auscultation  Pulmonary exam normal       Cardiovascular hypertension, Pt. on medications negative cardio ROS Normal cardiovascular examRhythm:Regular Rate:Normal     Neuro/Psych negative neurological ROS  negative psych ROS   GI/Hepatic negative GI ROS, Neg liver ROS,   Endo/Other  negative endocrine ROS  Renal/GU negative Renal ROS  negative genitourinary   Musculoskeletal negative musculoskeletal ROS (+)   Abdominal   Peds negative pediatric ROS (+)  Hematology negative hematology ROS (+)   Anesthesia Other Findings H/o triple level lumbar fusion with post-op infection  Reproductive/Obstetrics negative OB ROS                             Anesthesia Physical Anesthesia Plan  ASA: II  Anesthesia Plan: General   Post-op Pain Management:    Induction: Intravenous  Airway Management Planned: Oral ETT  Additional Equipment:   Intra-op Plan:   Post-operative Plan: Extubation in OR  Informed Consent: I have reviewed the patients History and Physical, chart, labs and discussed the procedure including the risks, benefits and alternatives for the proposed anesthesia with the patient or authorized representative who has indicated his/her understanding and acceptance.   Dental advisory given  Plan Discussed with: CRNA  Anesthesia Plan Comments: (Pt has decided against a post-op pain block. Had a previous block for ankle surgery that failed.)        Anesthesia Quick Evaluation

## 2015-02-27 NOTE — Interval H&P Note (Signed)
History and Physical Interval Note:  02/27/2015 12:27 PM  [name]  has presented today for surgery, with the diagnosis of RIGHT KNEE OA  The various methods of treatment have been discussed with the patient and family. After consideration of risks, benefits and other options for treatment, the patient has consented to  Procedure(s): RIGHT TOTAL KNEE ARTHROPLASTY (Right) as a surgical intervention .  The patient's history has been reviewed, patient examined, no change in status, stable for surgery.  I have reviewed the patient's chart and labs.  Questions were answered to the patient's satisfaction.     Shelda Pal

## 2015-02-27 NOTE — Transfer of Care (Signed)
Immediate Anesthesia Transfer of Care Note  Patient: [name]  Procedure(s) Performed: Procedure(s): RIGHT TOTAL KNEE ARTHROPLASTY, LEFT KNEE CORTISONE INJECTION (Bilateral)  Patient Location: PACU  Anesthesia Type:General  Level of Consciousness: awake, alert , oriented and patient cooperative  Airway & Oxygen Therapy: Patient Spontanous Breathing and Patient connected to face mask oxygen  Post-op Assessment: Report given to RN and Post -op Vital signs reviewed and stable  Post vital signs: Reviewed and stable  Last Vitals:  Filed Vitals:   02/27/15 1118  BP: 152/75  Pulse: 97  Temp: 36.8 C  Resp: 18    Complications: No apparent anesthesia complications

## 2015-02-27 NOTE — Anesthesia Postprocedure Evaluation (Signed)
  Anesthesia Post-op Note  Patient: [name]  Procedure(s) Performed: Procedure(s) (LRB): RIGHT TOTAL KNEE ARTHROPLASTY, LEFT KNEE CORTISONE INJECTION (Bilateral)  Patient Location: PACU  Anesthesia Type: General  Level of Consciousness: awake and alert   Airway and Oxygen Therapy: Patient Spontanous Breathing  Post-op Pain: mild  Post-op Assessment: Post-op Vital signs reviewed, Patient's Cardiovascular Status Stable, Respiratory Function Stable, Patent Airway and No signs of Nausea or vomiting  Last Vitals:  Filed Vitals:   02/27/15 1657  BP: 136/77  Pulse: 98  Temp: 36.7 C  Resp: 15    Post-op Vital Signs: stable   Complications: No apparent anesthesia complications

## 2015-02-28 LAB — BASIC METABOLIC PANEL
Anion gap: 6 (ref 5–15)
BUN: 11 mg/dL (ref 6–20)
CO2: 29 mmol/L (ref 22–32)
Calcium: 9.2 mg/dL (ref 8.9–10.3)
Chloride: 102 mmol/L (ref 101–111)
Creatinine, Ser: 0.69 mg/dL (ref 0.44–1.00)
GFR calc Af Amer: >60 mL/min (ref >60)
GFR calc non Af Amer: >60 mL/min (ref >60)
Glucose, Bld: 156 mg/dL — ABNORMAL HIGH (ref 65–99)
Potassium: 4.4 mmol/L (ref 3.5–5.1)
Sodium: 137 mmol/L (ref 135–145)

## 2015-02-28 LAB — CBC
HCT: 35.2 % — ABNORMAL LOW (ref 36.0–46.0)
Hemoglobin: 11.9 g/dL — ABNORMAL LOW (ref 12.0–15.0)
MCH: 30 pg (ref 26.0–34.0)
MCHC: 33.8 g/dL (ref 30.0–36.0)
MCV: 88.7 fL (ref 78.0–100.0)
Platelets: 282 10*3/uL (ref 150–400)
RBC: 3.97 MIL/uL (ref 3.87–5.11)
RDW: 14.3 % (ref 11.5–15.5)
WBC: 15.8 10*3/uL — ABNORMAL HIGH (ref 4.0–10.5)

## 2015-02-28 MED ORDER — ACETAMINOPHEN 325 MG PO TABS: 650.0000 mg | ORAL_TABLET | Freq: Four times a day (QID) | ORAL | Status: DC | PRN

## 2015-02-28 MED ORDER — ASPIRIN 325 MG PO TBEC: 325.0000 mg | DELAYED_RELEASE_TABLET | Freq: Two times a day (BID) | ORAL | Status: AC

## 2015-02-28 MED ORDER — POLYETHYLENE GLYCOL 3350 17 G PO PACK: 17.0000 g | PACK | Freq: Two times a day (BID) | ORAL | Status: DC

## 2015-02-28 MED ORDER — METHOCARBAMOL 500 MG PO TABS: 500.0000 mg | ORAL_TABLET | Freq: Four times a day (QID) | ORAL | Status: DC | PRN

## 2015-02-28 MED ORDER — DOCUSATE SODIUM 100 MG PO CAPS: 100.0000 mg | ORAL_CAPSULE | Freq: Two times a day (BID) | ORAL | Status: DC

## 2015-02-28 MED ORDER — OXYCODONE HCL 5 MG PO TABS
5.0000 mg | ORAL_TABLET | ORAL | Status: DC
  Administered 2015-02-28: 10 mg via ORAL
  Administered 2015-02-28: 15 mg via ORAL
  Administered 2015-02-28: 5 mg via ORAL
  Filled 2015-02-28: qty 3
  Filled 2015-02-28: qty 2
  Filled 2015-02-28: qty 1

## 2015-02-28 MED ORDER — FERROUS SULFATE 325 (65 FE) MG PO TABS: 325.0000 mg | ORAL_TABLET | Freq: Three times a day (TID) | ORAL | Status: DC

## 2015-02-28 MED ORDER — OXYCODONE HCL 5 MG PO TABS: 5.0000 mg | ORAL_TABLET | ORAL | Status: DC | PRN

## 2015-02-28 NOTE — Progress Notes (Signed)
     Subjective: 1 Day Post-Op Procedure(s) (LRB): RIGHT TOTAL KNEE ARTHROPLASTY, LEFT KNEE CORTISONE INJECTION (Bilateral)   Patient reports pain as moderate, pain not controlled.  She did have an incident during the night for which she received Dilaudid, but pain better now. No other events throughout the night. Ready to be discharged home if she does well with PT and Pain stays controlled.   Objective:   VITALS:   Filed Vitals:   02/28/15 0817  BP: 124/61  Pulse: 86  Temp: 98.2 F (36.8 C)  Resp: 16    Dorsiflexion/Plantar flexion intact Incision: dressing C/D/I No cellulitis present Compartment soft  LABS  Recent Labs  02/28/15 0415  HGB 11.9*  HCT 35.2*  WBC 15.8*  PLT 282     Recent Labs  02/28/15 0415  NA 137  K 4.4  BUN 11  CREATININE 0.69  GLUCOSE 156*     Assessment/Plan: 1 Day Post-Op Procedure(s) (LRB): RIGHT TOTAL KNEE ARTHROPLASTY, LEFT KNEE CORTISONE INJECTION (Bilateral) Foley cath d/c'ed Advance diet Up with therapy D/C IV fluids Discharge home with home health Follow up in 2 weeks at [facility]. Follow up with OLIN,Maeleigh Buschman D in 2 weeks.  Contact information:  [facility] [address] [place] [address] [phone]    Obese (BMI 30-39.9) Estimated body mass index is 39.94 kg/(m^2) as calculated from the following:   Height as of this encounter: 5\' 5"  (1.651 m).   Weight as of this encounter: 108.863 kg (240 lb). Patient also counseled that weight may inhibit the healing process Patient counseled that losing weight will help with future health issues       . Miabella Shannahan   PAC  02/28/2015, 8:31 AM

## 2015-02-28 NOTE — Progress Notes (Signed)
Physical Therapy Treatment Patient Details Name: [name] MRN: [id number] DOB: [birth year] Today's Date: 02/28/2015    History of Present Illness 59 yo female s/p R TKA, L knee cortison injection 02/27/15. Hx of lumbar fusion    PT Comments    Practiced stair negotiation and walked a 2nd time ~40 feet. Pain rated 8/10. Pt is possibly planning to d/c home later today however it is dependent on pain control.   Follow Up Recommendations  Home health PT     Equipment Recommendations  None recommended by PT    Recommendations for Other Services OT consult     Precautions / Restrictions Precautions Precautions: Fall;Knee Restrictions Weight Bearing Restrictions: No    Mobility  Bed Mobility Overal bed mobility: Needs Assistance Bed Mobility: Sit to Supine     Supine to sit: Min guard Sit to supine: Min guard   General bed mobility comments: close guard for safety. Pt requested to perform task unassisted. Increased time.   Transfers Overall transfer level: Needs assistance Equipment used: Rolling walker (2 wheeled) Transfers: Sit to/from Stand Sit to Stand: Min guard         General transfer comment: close guard for safety. VCs safety, hand placement  Ambulation/Gait Ambulation/Gait assistance: Min guard Ambulation Distance (Feet): 40 Feet Assistive device: Rolling walker (2 wheeled) Gait Pattern/deviations: Step-to pattern;Antalgic     General Gait Details: close guard for safety. VCs safety, sequence. slow gait speed.    Stairs Stairs: Yes Stairs assistance: Min assist Stair Management: Step to pattern;Forwards;One rail Right Number of Stairs: 2 General stair comments: Practiced 1 HHA and 1 rails vs 2 hands on 1 rail-Min assist for both techniques. Increased pain with increased WBing on R LE. VCs safety, technique, sequence. Husband present to observe.  Wheelchair Mobility    Modified Rankin (Stroke Patients Only)       Balance                                     Cognition Arousal/Alertness: Awake/Hardy Behavior During Therapy: WFL for tasks assessed/performed Overall Cognitive Status: Within Functional Limits for tasks assessed                      Exercises    General Comments        Pertinent Vitals/Pain Pain Assessment: 0-10 Pain Score: 8  Pain Location: R knee Pain Descriptors / Indicators: Aching;Discomfort;Sore Pain Intervention(s): Monitored during session;Repositioned;Ice applied    Home Living     Available Help at Discharge: Family Type of Home: House Home Access: Stairs to enter Entrance Stairs-Rails: Right Home Layout: Able to live on main level with bedroom/bathroom;Two level        Prior Function            PT Goals (current goals can now be found in the care plan section) Acute Rehab PT Goals Patient Stated Goal: go home with husband  PT Goal Formulation: With patient Time For Goal Achievement: 03/07/15 Potential to Achieve Goals: Good Progress towards PT goals: Progressing toward goals (Pain still moderate-high)    Frequency  7X/week    PT Plan Current plan remains appropriate    Co-evaluation             End of Session Equipment Utilized During Treatment: Gait belt Activity Tolerance: Patient limited by pain Patient left: in bed;with call bell/phone within reach;with family/visitor present  Time: 2831-5176 PT Time Calculation (min) (ACUTE ONLY): 44 min  Charges:  $Gait Training: 23-37 mins                    G Codes:      [name], MPT Pager: [phone]

## 2015-02-28 NOTE — Evaluation (Signed)
Physical Therapy Evaluation Patient Details Name: [name] MRN: [id number] DOB: 1955-08-12 Today's Date: 02/28/2015   History of Present Illness  59 yo female s/p R TKA, L knee cortison injection 02/27/15. Hx of lumbar fusion  Clinical Impression  On eval, pt was Min guard assist for mobility-walked ~60 feet with RW. Pain rated 8/10-pt tearful at times. Will plan to have a 2nd session to practice steps.     Follow Up Recommendations Home health PT    Equipment Recommendations  None recommended by PT    Recommendations for Other Services OT consult     Precautions / Restrictions Precautions Precautions: Fall;Knee Precaution Comments: reviewed no pillow under knee Restrictions Weight Bearing Restrictions: No      Mobility  Bed Mobility Overal bed mobility: Needs Assistance Bed Mobility: Supine to Sit     Supine to sit: Min guard     General bed mobility comments: close guard for safety. Pt requested to perform task unassisted. Increased time.   Transfers Overall transfer level: Needs assistance Equipment used: Rolling walker (2 wheeled) Transfers: Sit to/from Stand Sit to Stand: Min guard;From elevated surface Stand pivot transfers: Min assist       General transfer comment: close guard for safety. VCs safety, hand placement  Ambulation/Gait Ambulation/Gait assistance: Min guard Ambulation Distance (Feet): 60 Feet Assistive device: Rolling walker (2 wheeled) Gait Pattern/deviations: Step-to pattern;Antalgic     General Gait Details: close guard for safety. VCs safety, sequence. slow gait speed.   Stairs            Wheelchair Mobility    Modified Rankin (Stroke Patients Only)       Balance Overall balance assessment: Needs assistance Sitting-balance support: No upper extremity supported;Feet supported Sitting balance-Leahy Scale: Fair     Standing balance support: Bilateral upper extremity supported;During functional activity Standing  balance-Leahy Scale: Fair                               Pertinent Vitals/Pain Pain Assessment: 0-10 Pain Score: 8  Faces Pain Scale: Hurts whole lot Pain Location: r knee Pain Descriptors / Indicators: Crying;Discomfort;Sore;Aching Pain Intervention(s): Monitored during session;Premedicated before session;Repositioned;Ice applied    Home Living Family/patient expects to be discharged to:: Private residence Living Arrangements: Spouse/significant other Available Help at Discharge: Family Type of Home: House Home Access: Stairs to enter Entrance Stairs-Rails: Right Entrance Stairs-Number of Steps: 2 Home Layout: Able to live on main level with bedroom/bathroom;Two level Home Equipment: Walker - 2 wheels;Cane - quad;Bedside commode;Shower seat      Prior Function Level of Independence: Independent               Hand Dominance   Dominant Hand: Right    Extremity/Trunk Assessment   Upper Extremity Assessment: Defer to OT evaluation           Lower Extremity Assessment: RLE deficits/detail RLE Deficits / Details: hip flex 3-/5, moves ankle well    Cervical / Trunk Assessment: Normal  Communication   Communication: No difficulties  Cognition Arousal/Alertness: Awake/alert Behavior During Therapy: WFL for tasks assessed/performed Overall Cognitive Status: Within Functional Limits for tasks assessed                      General Comments      Exercises Total Joint Exercises Ankle Circles/Pumps: AROM;Both;10 reps;Supine Quad Sets: AROM;Both;10 reps;Supine Heel Slides: AAROM;Right;Supine Hip ABduction/ADduction: AAROM;Right;10 reps;Supine Straight Leg Raises: AAROM;Right;10 reps;Supine  Goniometric ROM: ~10-50 degrees      Assessment/Plan    PT Assessment Patient needs continued PT services  PT Diagnosis Difficulty walking;Acute pain   PT Problem List Decreased strength;Decreased range of motion;Decreased activity tolerance;Decreased  balance;Decreased mobility;Pain;Decreased knowledge of use of DME  PT Treatment Interventions DME instruction;Gait training;Stair training;Functional mobility training;Therapeutic activities;Patient/family education;Balance training;Therapeutic exercise   PT Goals (Current goals can be found in the Care Plan section) Acute Rehab PT Goals Patient Stated Goal: go home with husband  PT Goal Formulation: With patient Time For Goal Achievement: 03/07/15 Potential to Achieve Goals: Good    Frequency 7X/week   Barriers to discharge        Co-evaluation               End of Session Equipment Utilized During Treatment: Gait belt Activity Tolerance: Patient limited by pain Patient left: in chair;with call bell/phone within reach;with family/visitor present           Time: 4098-1191 PT Time Calculation (min) (ACUTE ONLY): 23 min   Charges:   PT Evaluation $Initial PT Evaluation Tier I: 1 Procedure PT Treatments $Gait Training: 8-22 mins   PT G Codes:        [name], MPT Pager: [phone]

## 2015-02-28 NOTE — Evaluation (Signed)
Occupational Therapy Evaluation Patient Details Name: [name] MRN: [id number] DOB: 1955-09-16 Today's Date: 02/28/2015    History of Present Illness 59 y/o female s/p R TKA and left knee cortisone injection    Clinical Impression   Patient presenting with decreased ADL and functional mobility independence. Patient independent > mod I PTA, pt with h/o back surgery resulting in staph infection. Patient currently requires up to min/mod assist for LB ADLs and min assist with sit<>stands and functional mobility; mod verbal cueing needed for safety, hand placement, technique, sequencing. Patient will benefit from acute OT to increase overall independence in the areas of ADLs, functional mobility, and overall safety in order to safely discharge home with husband.   Patient took more than a reasonable amount of time to complete tasks during OT evaluation. From an OT standpoint, clinically believe pt will benefit from an extra night and extra day of therapy tomorrow prior to discharge home. Pt with increased lethargy during this evaluation and increased anticipatory pain, not putting much weight through operated extremity (RLE).      Follow Up Recommendations  No OT follow up;Supervision/Assistance - 24 hour    Equipment Recommendations  3 in 1 bedside comode (pt reports she has an old 3-in-1, shower seat, and AE)    Recommendations for Other Services  None at this time  Precautions / Restrictions Precautions Precautions: Knee;Fall Precaution Comments: reviewed no pillow under knee Restrictions Weight Bearing Restrictions: No    Mobility Bed Mobility Overal bed mobility: Needs Assistance Bed Mobility: Supine to Sit     Supine to sit: Supervision     General bed mobility comments: Minimal use of bed rails and HOB down. Pt close supervision.   Transfers Overall transfer level: Needs assistance Equipment used: Rolling walker (2 wheeled) Transfers: Sit to/from [employer] Sit to Stand: Min assist Stand pivot transfers: Min assist       General transfer comment: Min assist for safety and to power up to standing. Pt required mod verbal cueing for hand placement, technique, and sequencing during transfers.     Balance Overall balance assessment: Needs assistance Sitting-balance support: No upper extremity supported;Feet supported Sitting balance-Leahy Scale: Fair     Standing balance support: Bilateral upper extremity supported;During functional activity Standing balance-Leahy Scale: Fair    ADL Overall ADL's : Needs assistance/impaired General ADL Comments: Pt unable to reach RLE for LB ADLs. Pt reports recent back surgery and that she still has back precautions. Encouraged use of AE to increase independence with LB ADLs, pt reports she has AE. Pt engaged in bed mobility with supervision using bed rails. Pt stood with RW and pivoted > BSC. Pt asking for ice packs, ice packs not cold yet. Discussed use of ice packs with nurse. Discussed shower stall transfer and hope to see patient tomorrow to go over how to get in/out of walk-in shower safetly using RW and shower seat.     Pertinent Vitals/Pain Pain Assessment: Faces Faces Pain Scale: Hurts whole lot Pain Location: right knee Pain Descriptors / Indicators: Discomfort;Grimacing;Guarding Pain Intervention(s): Limited activity within patient's tolerance;Monitored during session;Repositioned;RN gave pain meds during session;Patient requesting pain meds-RN notified     Hand Dominance Right   Extremity/Trunk Assessment Upper Extremity Assessment Upper Extremity Assessment: Overall WFL for tasks assessed   Lower Extremity Assessment Lower Extremity Assessment: Defer to PT evaluation   Cervical / Trunk Assessment Cervical / Trunk Assessment: Normal   Communication Communication Communication: No difficulties   Cognition Arousal/Alertness: Lethargic  Behavior During Therapy: WFL for tasks  assessed/performed Overall Cognitive Status: Within Functional Limits for tasks assessed              Home Living Family/patient expects to be discharged to:: Private residence Living Arrangements: Spouse/significant other Available Help at Discharge: Family;Available 24 hours/day Bathroom Shower/Tub: Producer, television/film/video: Standard     Home Equipment: Environmental consultant - 2 wheels;Cane - quad;Bedside commode;Shower seat    Prior Functioning/Environment Level of Independence: Independent     OT Diagnosis: Generalized weakness;Acute pain   OT Problem List: Decreased strength;Decreased range of motion;Decreased activity tolerance;Impaired balance (sitting and/or standing);Decreased safety awareness;Decreased knowledge of use of DME or AE;Decreased knowledge of precautions;Pain   OT Treatment/Interventions: Self-care/ADL training;Energy conservation;DME and/or AE instruction;Balance training;Patient/family education    OT Goals(Current goals can be found in the care plan section) Acute Rehab OT Goals Patient Stated Goal: go home with husband  OT Goal Formulation: With patient/family Time For Goal Achievement: 03/14/15 Potential to Achieve Goals: Good ADL Goals Pt Will Perform Lower Body Bathing: with modified independence;sit to/from stand;with adaptive equipment Pt Will Perform Lower Body Dressing: with modified independence;sit to/from stand;with adaptive equipment Pt Will Transfer to Toilet: with modified independence;ambulating;bedside commode Pt Will Perform Tub/Shower Transfer: Shower transfer;shower seat;ambulating;rolling walker;with modified independence Additional ADL Goal #1: Pt will be mod I with functional mobility using RW  OT Frequency: Min 2X/week   Barriers to D/C: None known at this time   End of Session Equipment Utilized During Treatment: Gait belt;Rolling walker (no KI ordered ) Nurse Communication: Mobility status  Activity Tolerance: Patient tolerated  treatment well Patient left: in bed;with call bell/phone within reach;with nursing/sitter in room;with family/visitor present (sitting EOB)   Time: 9024-0973 OT Time Calculation (min): 35 min Charges:  OT General Charges $OT Visit: 1 Procedure OT Evaluation $Initial OT Evaluation Tier I: 1 Procedure OT Treatments $Self Care/Home Management : 8-22 mins  Telicia Hodgkiss , MS, OTR/L, CLT Pager: [phone]  02/28/2015, 10:26 AM

## 2015-02-28 NOTE — Discharge Instructions (Signed)

## 2015-02-28 NOTE — Care Management Note (Signed)
Case Management Note  Patient Details  Name: [name] MRN: [id number] Date of Birth: [birth year]  Subjective/Objective:       Right total knee replacement             Action/Plan: Discharge planning, spoke with patient and spouse. Have [facility], have used them in the past. Requesting 3-n-1, has RW.  Expected Discharge Date:                  Expected Discharge Plan:  Home w Home Health Services  In-House Referral:  NA  Discharge planning Services  CM Consult  Post Acute Care Choice:  Home Health Choice offered to:  Patient  DME Arranged:  3-N-1 DME Agency:  [facility]  HH Arranged:  PT HH Agency:  [facility]  Status of Service:  Completed, signed off  Medicare Important Message Given:    Date Medicare IM Given:    Medicare IM give by:    Date Additional Medicare IM Given:    Additional Medicare Important Message give by:     If discussed at Long Length of Stay Meetings, dates discussed:    Additional Comments:  Alexis Goodell, RN 02/28/2015, 10:18 AM

## 2015-03-01 ENCOUNTER — Encounter (HOSPITAL_COMMUNITY): Payer: Self-pay | Admitting: Orthopedic Surgery

## 2015-03-05 NOTE — Discharge Summary (Signed)
Physician Discharge Summary  Patient ID: [name] MRN: [id number] DOB/AGE: 12/28/1955 59 y.o.  Admit date: 02/27/2015 Discharge date: 02/28/2015   Procedures:  Procedure(s) (LRB): RIGHT TOTAL KNEE ARTHROPLASTY, LEFT KNEE CORTISONE INJECTION (Bilateral)  Attending Physician:  Dr. Durene Romans   Admission Diagnoses:   Bilaterally knee primary OA / pain  Discharge Diagnoses:  Principal Problem:   S/P right TKA Active Problems:   S/P knee replacement   Obese  Past Medical History  Diagnosis Date  . Hypertension   . Hypothyroidism   . Anxiety   . GERD (gastroesophageal reflux disease)   . Arthritis   . Fibromyalgia   . Restless leg syndrome   . Complication of anesthesia     during surgery she could hear staff talking ans could feel them preping her arm over 30 yrs. ago    HPI:    [name], 59 y.o. female, has a history of pain and functional disability in the bilaterally knee due to arthritis and has failed non-surgical conservative treatments for greater than 12 weeks to include NSAID's and/or analgesics, corticosteriod injections, use of assistive devices and activity modification. Onset of symptoms was gradual, starting 5+ years ago with gradually worsening course since that time. The patient noted no past surgery on the bilateral knee(s). Patient currently rates pain in the right knee(s) at 10 out of 10 with activity. Patient currently rates pain in the left knee at 7 out of 10 with activity. She feels that she will have difficulty with progressing with PT on the right knee after surgery if she doesn't receive a cortisone injection to alleviate some of the pain. Patient has night pain, worsening of pain with activity and weight bearing, pain that interferes with activities of daily living, pain with passive range of motion, crepitus and joint swelling. Patient has evidence of periarticular osteophytes and joint space narrowing by imaging studies. There is no  active infection. Risks, benefits and expectations were discussed with the patient. Risks including but not limited to the risk of anesthesia, blood clots, nerve damage, blood vessel damage, failure of the prosthesis, infection and up to and including death. Patient understand the risks, benefits and expectations and wishes to proceed with surgery.   PCP: Georgianne Fick, MD   Discharged Condition: good  Hospital Course:  Patient underwent the above stated procedure on 02/27/2015. Patient tolerated the procedure well and brought to the recovery room in good condition and subsequently to the floor.  POD #1 BP: 124/61 ; Pulse: 86 ; Temp: 98.2 F (36.8 C) ; Resp: 16 Patient reports pain as moderate, pain not controlled. She did have an incident during the night for which she received Dilaudid, but pain better now. No other events throughout the night. Ready to be discharged home if she does well with PT and Pain stays controlled.  Dorsiflexion/plantar flexion intact, incision: dressing C/D/I, no cellulitis present and compartment soft.   LABS  Basename    HGB  11.9  HCT  35.2    Discharge Exam: General appearance: alert, cooperative and no distress Extremities: Homans sign is negative, no sign of DVT, no edema, redness or tenderness in the calves or thighs and no ulcers, gangrene or trophic changes  Disposition: Home with follow up in 2 weeks   Follow-up Information    Follow up with Shelda Pal, MD. Schedule an appointment as soon as possible for a visit in 2 weeks.   Specialty:  Orthopedic Surgery   Contact information:  [address] [place] [address] [phone]       Follow up with [facility].   Why:  physical therapy   Contact information:   [address] [place] [address] [phone]       Discharge Instructions    Call MD / Call 911    Complete by:  As directed   If you experience chest pain or shortness  of breath, CALL 911 and be transported to the hospital emergency room.  If you develope a fever above 101 F, pus (white drainage) or increased drainage or redness at the wound, or calf pain, call your surgeon's office.     Change dressing    Complete by:  As directed   Maintain surgical dressing until follow up in the clinic. If the edges start to pull up, may reinforce with tape. If the dressing is no longer working, may remove and cover with gauze and tape, but must keep the area dry and clean.  Call with any questions or concerns.     Constipation Prevention    Complete by:  As directed   Drink plenty of fluids.  Prune juice may be helpful.  You may use a stool softener, such as Colace (over the counter) 100 mg twice a day.  Use MiraLax (over the counter) for constipation as needed.     Diet - low sodium heart healthy    Complete by:  As directed      Discharge instructions    Complete by:  As directed   Maintain surgical dressing until follow up in the clinic. If the edges start to pull up, may reinforce with tape. If the dressing is no longer working, may remove and cover with gauze and tape, but must keep the area dry and clean.  Follow up in 2 weeks at [facility]. Call with any questions or concerns.     Increase activity slowly as tolerated    Complete by:  As directed   Weight bearing as tolerated with assist device (walker, cane, etc) as directed, use it as long as suggested by your surgeon or therapist, typically at least 4-6 weeks.     TED hose    Complete by:  As directed   Use stockings (TED hose) for 2 weeks on both leg(s).  You may remove them at night for sleeping.             Medication List    STOP taking these medications        cyclobenzaprine 10 MG tablet  Commonly known as:  FLEXERIL      TAKE these medications        acetaminophen 325 MG tablet  Commonly known as:  TYLENOL  Take 2 tablets (650 mg total) by mouth every 6 (six) hours as needed  for mild pain or moderate pain.     ALPRAZolam 0.25 MG tablet  Commonly known as:  XANAX  Take 0.25 mg by mouth daily as needed for anxiety.     aspirin 325 MG EC tablet  Take 1 tablet (325 mg total) by mouth 2 (two) times daily.     buPROPion 150 MG 24 hr tablet  Commonly known as:  WELLBUTRIN XL  Take 450 mg by mouth daily.     celecoxib 50 MG capsule  Commonly known as:  CELEBREX  Take 50 mg by mouth 2 (two) times daily.     docusate sodium 100 MG capsule  Commonly known as:  COLACE  Take 1 capsule (100 mg total) by mouth 2 (two) times daily.     doxycycline 100 MG tablet  Commonly known as:  VIBRA-TABS  Take 100 mg by mouth 2 (two) times daily.     estradiol 1 MG tablet  Commonly known as:  ESTRACE  Take 0.5 mg by mouth at bedtime.     ferrous sulfate 325 (65 FE) MG tablet  Take 1 tablet (325 mg total) by mouth 3 (three) times daily after meals.     fexofenadine 180 MG tablet  Commonly known as:  ALLEGRA  Take 180 mg by mouth daily as needed for allergies.     levothyroxine 200 MCG tablet  Commonly known as:  SYNTHROID, LEVOTHROID  Take 200 mcg by mouth daily before breakfast. Takes six days a week M-Sat. (does not take one on Sunday     losartan-hydrochlorothiazide 100-12.5 MG per tablet  Commonly known as:  HYZAAR  Take 1 tablet by mouth every morning.     methocarbamol 500 MG tablet  Commonly known as:  ROBAXIN  Take 1 tablet (500 mg total) by mouth every 6 (six) hours as needed for muscle spasms.     NEXIUM 24HR 20 MG capsule  Generic drug:  esomeprazole  Take 20 mg by mouth every morning.     oxyCODONE 5 MG immediate release tablet  Commonly known as:  Oxy IR/ROXICODONE  Take 1-3 tablets (5-15 mg total) by mouth every 4 (four) hours as needed for severe pain.     polyethylene glycol packet  Commonly known as:  MIRALAX / GLYCOLAX  Take 17 g by mouth 2 (two) times daily.     pramipexole 0.125 MG tablet  Commonly known as:  MIRAPEX  Take 0.125 mg by  mouth 3 (three) times daily.     PROBIOTIC PO  Take 1 tablet by mouth every morning.         Signed: Anastasio Auerbach. Willella Harding   PA-C  03/05/2015, 10:14 AM

## 2015-06-03 NOTE — H&P (Signed)
TOTAL KNEE ADMISSION H&P  Patient is being admitted for left total knee arthroplasty.  Subjective:  Chief Complaint:     Left knee primary OA / pain  HPI: [name], 59 y.o. female, has a history of pain and functional disability in the left knee due to arthritis and has failed non-surgical conservative treatments for greater than 12 weeks to include NSAID's and/or analgesics, corticosteriod injections and activity modification.  Onset of symptoms was gradual, starting 5+ years ago with gradually worsening course since that time. The patient noted prior procedures on the knee to include  arthroplasty on the right knee on 02/27/2015 per Dr. Charlann Boxer.  Patient currently rates pain in the left knee(s) at 8 out of 10 with activity. Patient has worsening of pain with activity and weight bearing, pain that interferes with activities of daily living, pain with passive range of motion, crepitus and joint swelling.  Patient has evidence of periarticular osteophytes and joint space narrowing by imaging studies.  There is no active infection.  Risks, benefits and expectations were discussed with the patient.  Risks including but not limited to the risk of anesthesia, blood clots, nerve damage, blood vessel damage, failure of the prosthesis, infection and up to and including death.  Patient understand the risks, benefits and expectations and wishes to proceed with surgery.   PCP: Georgianne Fick, MD  D/C Plans:      Home with HHPT  Post-op Meds:       No Rx given  Tranexamic Acid:      To be given - IV   Decadron:      Is to be given  FYI:     ASA post-op  Oxycodone    Patient Active Problem List   Diagnosis Date Noted  . Obese 02/28/2015  . S/P right TKA 02/27/2015  . S/P knee replacement 02/27/2015  . Lumbar surgical wound fluid collection   . Wound drainage   . Staphylococcus aureus infection   . Screen for STD (sexually transmitted disease)   . Wound infection after surgery 04/24/2014  .  Spinal stenosis of lumbar region with neurogenic claudication 03/30/2014   Past Medical History  Diagnosis Date  . Hypertension   . Hypothyroidism   . Anxiety   . GERD (gastroesophageal reflux disease)   . Arthritis   . Fibromyalgia   . Restless leg syndrome   . Complication of anesthesia     during surgery she could hear staff talking ans could feel them preping her arm over 30 yrs. ago    Past Surgical History  Procedure Laterality Date  . Abdominal hysterectomy  1981    partial  . Cervical fusion  1990  . Cervical fusion      2000  . Bladder suspension      2004  . Back surgery  2007  . Foot surgery Right 2009    reconstruction with hardware  . Foot surgery Left 2013    reconstruction with hardware  . Cyst excision Left 1985    wrist  . Cyst excision  1985    chest  . Lumbar wound debridement N/A 04/25/2014    Procedure: LUMBAR WOUND DEBRIDEMENT Placement of Woundvac);  Surgeon: [name], MD;  Location: [facility];  Service: Neurosurgery;  Laterality: N/A;  . Application of wound vac N/A 04/25/2014    Procedure: APPLICATION OF WOUND VAC;  Surgeon: [name], MD;  Location: [facility];  Service: Neurosurgery;  Laterality: N/A;  . Lumbar  fusion  03/2014  . Total knee arthroplasty Bilateral 02/27/2015    Procedure: RIGHT TOTAL KNEE ARTHROPLASTY, LEFT KNEE CORTISONE INJECTION;  Surgeon: Durene Romans, MD;  Location: [facility];  Service: Orthopedics;  Laterality: Bilateral;    No prescriptions prior to admission   Allergies  Allergen Reactions  . Clindamycin/Lincomycin Rash  . Influenza Vaccines Hives  . Lodine [Etodolac] Rash    Social History  Substance Use Topics  . Smoking status: Never Smoker   . Smokeless tobacco: Not on file  . Alcohol Use: No       Review of Systems  Constitutional: Negative.   HENT: Negative.   Eyes: Negative.   Respiratory: Negative.   Cardiovascular: Negative.   Gastrointestinal: Positive for heartburn.   Genitourinary: Negative.   Musculoskeletal: Positive for joint pain.  Skin: Negative.   Neurological: Negative.   Endo/Heme/Allergies: Negative.   Psychiatric/Behavioral: The patient is nervous/anxious.     Objective:  Physical Exam  Constitutional: She is oriented to person, place, and time. She appears well-developed and well-nourished.  HENT:  Head: Normocephalic and atraumatic.  Eyes: Pupils are equal, round, and reactive to light.  Neck: Neck supple. No JVD present. No tracheal deviation present. No thyromegaly present.  Cardiovascular: Normal rate, regular rhythm, normal heart sounds and intact distal pulses.   Respiratory: Effort normal and breath sounds normal. No stridor. No respiratory distress. She has no wheezes.  GI: Soft. There is no tenderness. There is no guarding.  Musculoskeletal:       Left knee: She exhibits decreased range of motion, swelling and bony tenderness. She exhibits no ecchymosis, no deformity, no laceration, no erythema and normal alignment. Tenderness found.  Lymphadenopathy:    She has no cervical adenopathy.  Neurological: She is alert and oriented to person, place, and time. A sensory deficit (occasional numbness and tinling in the left foot) is present.  Skin: Skin is warm and dry.  Psychiatric: She has a normal mood and affect.       Labs:  Estimated body mass index is 39.94 kg/(m^2) as calculated from the following:   Height as of 02/27/15: 5\' 5"  (1.651 m).   Weight as of 02/20/15: 108.863 kg (240 lb).   Imaging Review Plain radiographs demonstrate severe degenerative joint disease of the left knee(s). The overall alignment is neutral. The bone quality appears to be good for age and reported activity level.  Assessment/Plan:  End stage arthritis, left knee   The patient history, physical examination, clinical judgment of the provider and imaging studies are consistent with end stage degenerative joint disease of the left knee(s) and  total knee arthroplasty is deemed medically necessary. The treatment options including medical management, injection therapy arthroscopy and arthroplasty were discussed at length. The risks and benefits of total knee arthroplasty were presented and reviewed. The risks due to aseptic loosening, infection, stiffness, patella tracking problems, thromboembolic complications and other imponderables were discussed. The patient acknowledged the explanation, agreed to proceed with the plan and consent was signed. Patient is being admitted for inpatient treatment for surgery, pain control, PT, OT, prophylactic antibiotics, VTE prophylaxis, progressive ambulation and ADL's and discharge planning. The patient is planning to be discharged home with home health services.      02/22/15 Kazi Montoro   PA-C  06/03/2015, 9:49 AM

## 2015-06-05 NOTE — Patient Instructions (Signed)
[name]  06/05/2015   Your procedure is scheduled on: 06/19/2015    Report to [facility]  Entrance take [facility]  elevators to 3rd floor to  Short Stay Center at    1120 AM.  Call this number if you have problems the morning of surgery [phone]   Remember: ONLY 1 PERSON MAY GO WITH YOU TO SHORT STAY TO GET  READY MORNING OF YOUR SURGERY.  Do not eat food after midnite.  May have clear liquids from 12 midnite until 0730am then nothing by mouth.      Take these medicines the morning of surgery with A SIP OF WATER:  DO NOT TAKE ANY DIABETIC MEDICATIONS DAY OF YOUR SURGERY                               You may not have any metal on your body including hair pins and              piercings  Do not wear jewelry, make-up, lotions, powders or perfumes, deodorant             Do not wear nail polish.  Do not shave  48 hours prior to surgery.             Do not bring valuables to the hospital. [facility] IS NOT             RESPONSIBLE   FOR VALUABLES.  Contacts, dentures or bridgework may not be worn into surgery.  Leave suitcase in the car. After surgery it may be brought to your room.       Special Instructions: coughing and deep breathing exercises, leg exercises               Please read over the following fact sheets you were given: _____________________________________________________________________                CLEAR LIQUID DIET   Foods Allowed                                                                     Foods Excluded  Coffee and tea, regular and decaf                             liquids that you cannot  Plain Jell-O in any flavor                                             see through such as: Fruit ices (not with fruit pulp)                                     milk, soups, orange juice  Iced Popsicles  All solid food Carbonated beverages, regular and diet                                     Cranberry, grape and apple juices Sports drinks like Gatorade Lightly seasoned clear broth or consume(fat free) Sugar, honey syrup  Sample Menu Breakfast                                Lunch                                     Supper Cranberry juice                    Beef broth                            Chicken broth Jell-O                                     Grape juice                           Apple juice Coffee or tea                        Jell-O                                      Popsicle                                                Coffee or tea                        Coffee or tea  _____________________________________________________________________  [facility] - Preparing for Surgery Before surgery, you can play an important role.  Because skin is not sterile, your skin needs to be as free of germs as possible.  You can reduce the number of germs on your skin by washing with CHG (chlorahexidine gluconate) soap before surgery.  CHG is an antiseptic cleaner which kills germs and bonds with the skin to continue killing germs even after washing. Please DO NOT use if you have an allergy to CHG or antibacterial soaps.  If your skin becomes reddened/irritated stop using the CHG and inform your nurse when you arrive at Short Stay. Do not shave (including legs and underarms) for at least 48 hours prior to the first CHG shower.  You may shave your face/neck. Please follow these instructions carefully:  1.  Shower with CHG Soap the night before surgery and the  morning of Surgery.  2.  If you choose to wash your hair, wash your hair first as usual with your  normal  shampoo.  3.  After you shampoo, rinse your hair and body thoroughly to remove the  shampoo.  4.  Use CHG as you would any other liquid soap.  You can apply chg directly  to the skin and wash                       Gently with a scrungie or clean washcloth.  5.  Apply the CHG Soap to your body ONLY  FROM THE NECK DOWN.   Do not use on face/ open                           Wound or open sores. Avoid contact with eyes, ears mouth and genitals (private parts).                       Wash face,  Genitals (private parts) with your normal soap.             6.  Wash thoroughly, paying special attention to the area where your surgery  will be performed.  7.  Thoroughly rinse your body with warm water from the neck down.  8.  DO NOT shower/wash with your normal soap after using and rinsing off  the CHG Soap.                9.  Pat yourself dry with a clean towel.            10.  Wear clean pajamas.            11.  Place clean sheets on your bed the night of your first shower and do not  sleep with pets. Day of Surgery : Do not apply any lotions/deodorants the morning of surgery.  Please wear clean clothes to the hospital/surgery center.  FAILURE TO FOLLOW THESE INSTRUCTIONS MAY RESULT IN THE CANCELLATION OF YOUR SURGERY PATIENT SIGNATURE_________________________________  NURSE SIGNATURE__________________________________  ________________________________________________________________________  WHAT IS A BLOOD TRANSFUSION? Blood Transfusion Information  A transfusion is the replacement of blood or some of its parts. Blood is made up of multiple cells which provide different functions.  Red blood cells carry oxygen and are used for blood loss replacement.  White blood cells fight against infection.  Platelets control bleeding.  Plasma helps clot blood.  Other blood products are available for specialized needs, such as hemophilia or other clotting disorders. BEFORE THE TRANSFUSION  Who gives blood for transfusions?   Healthy volunteers who are fully evaluated to make sure their blood is safe. This is blood bank blood. Transfusion therapy is the safest it has ever been in the practice of medicine. Before blood is taken from a donor, a complete history is taken to make sure that person has  no history of diseases nor engages in risky social behavior (examples are intravenous drug use or sexual activity with multiple partners). The donor's travel history is screened to minimize risk of transmitting infections, such as malaria. The donated blood is tested for signs of infectious diseases, such as HIV and hepatitis. The blood is then tested to be sure it is compatible with you in order to minimize the chance of a transfusion reaction. If you or a relative donates blood, this is often done in anticipation of surgery and is not appropriate for emergency situations. It takes many days to process the donated blood. RISKS AND COMPLICATIONS Although transfusion therapy is very safe and saves many lives, the main dangers of transfusion include:  1. Getting an infectious disease. 2. Developing a transfusion reaction.  This is an allergic reaction to something in the blood you were given. Every precaution is taken to prevent this. The decision to have a blood transfusion has been considered carefully by your caregiver before blood is given. Blood is not given unless the benefits outweigh the risks. AFTER THE TRANSFUSION  Right after receiving a blood transfusion, you will usually feel much better and more energetic. This is especially true if your red blood cells have gotten low (anemic). The transfusion raises the level of the red blood cells which carry oxygen, and this usually causes an energy increase.  The nurse administering the transfusion will monitor you carefully for complications. HOME CARE INSTRUCTIONS  No special instructions are needed after a transfusion. You may find your energy is better. Speak with your caregiver about any limitations on activity for underlying diseases you may have. SEEK MEDICAL CARE IF:   Your condition is not improving after your transfusion.  You develop redness or irritation at the intravenous (IV) site. SEEK IMMEDIATE MEDICAL CARE IF:  Any of the following  symptoms occur over the next 12 hours:  Shaking chills.  You have a temperature by mouth above 102 F (38.9 C), not controlled by medicine.  Chest, back, or muscle pain.  People around you feel you are not acting correctly or are confused.  Shortness of breath or difficulty breathing.  Dizziness and fainting.  You get a rash or develop hives.  You have a decrease in urine output.  Your urine turns a dark color or changes to pink, red, or brown. Any of the following symptoms occur over the next 10 days:  You have a temperature by mouth above 102 F (38.9 C), not controlled by medicine.  Shortness of breath.  Weakness after normal activity.  The white part of the eye turns yellow (jaundice).  You have a decrease in the amount of urine or are urinating less often.  Your urine turns a dark color or changes to pink, red, or brown. Document Released: 06/06/2000 Document Revised: 09/01/2011 Document Reviewed: 01/24/2008 Exit[employer] Patient Information 2014 [place].  _______________________________________________________________________  Incentive Spirometer  An incentive spirometer is a tool that can help keep your lungs clear and active. This tool measures how well you are filling your lungs with each breath. Taking long deep breaths may help reverse or decrease the chance of developing breathing (pulmonary) problems (especially infection) following:  A long period of time when you are unable to move or be active. BEFORE THE PROCEDURE   If the spirometer includes an indicator to show your best effort, your nurse or respiratory therapist will set it to a desired goal.  If possible, sit up straight or lean slightly forward. Try not to slouch.  Hold the incentive spirometer in an upright position. INSTRUCTIONS FOR USE  3. Sit on the edge of your bed if possible, or sit up as far as you can in bed or on a chair. 4. Hold the incentive spirometer in an upright  position. 5. Breathe out normally. 6. Place the mouthpiece in your mouth and seal your lips tightly around it. 7. Breathe in slowly and as deeply as possible, raising the piston or the ball toward the top of the column. 8. Hold your breath for 3-5 seconds or for as long as possible. Allow the piston or ball to fall to the bottom of the column. 9. Remove the mouthpiece from your mouth and breathe out normally. 10. Rest for a few seconds and repeat Steps  1 through 7 at least 10 times every 1-2 hours when you are awake. Take your time and take a few normal breaths between deep breaths. 11. The spirometer may include an indicator to show your best effort. Use the indicator as a goal to work toward during each repetition. 12. After each set of 10 deep breaths, practice coughing to be sure your lungs are clear. If you have an incision (the cut made at the time of surgery), support your incision when coughing by placing a pillow or rolled up towels firmly against it. Once you are able to get out of bed, walk around indoors and cough well. You may stop using the incentive spirometer when instructed by your caregiver.  RISKS AND COMPLICATIONS  Take your time so you do not get dizzy or light-headed.  If you are in pain, you may need to take or ask for pain medication before doing incentive spirometry. It is harder to take a deep breath if you are having pain. AFTER USE  Rest and breathe slowly and easily.  It can be helpful to keep track of a log of your progress. Your caregiver can provide you with a simple table to help with this. If you are using the spirometer at home, follow these instructions: [place] IF:   You are having difficultly using the spirometer.  You have trouble using the spirometer as often as instructed.  Your pain medication is not giving enough relief while using the spirometer.  You develop fever of 100.5 F (38.1 C) or higher. SEEK IMMEDIATE MEDICAL CARE IF:    You cough up bloody sputum that had not been present before.  You develop fever of 102 F (38.9 C) or greater.  You develop worsening pain at or near the incision site. MAKE SURE YOU:   Understand these instructions.  Will watch your condition.  Will get help right away if you are not doing well or get worse. Document Released: 10/20/2006 Document Revised: 09/01/2011 Document Reviewed: 12/21/2006 [facility] Patient Information 2014 [place], [place].   ________________________________________________________________________

## 2015-06-11 ENCOUNTER — Other Ambulatory Visit (HOSPITAL_COMMUNITY): Payer: BLUE CROSS/BLUE SHIELD

## 2015-06-14 ENCOUNTER — Encounter (HOSPITAL_COMMUNITY)
Admission: RE | Admit: 2015-06-14 | Discharge: 2015-06-14 | Disposition: A | Discharge status: Home / Self Care | Payer: BLUE CROSS/BLUE SHIELD | Source: Ambulatory Visit | Attending: Orthopedic Surgery | Admitting: Orthopedic Surgery

## 2015-06-14 ENCOUNTER — Encounter (HOSPITAL_COMMUNITY): Payer: Self-pay

## 2015-06-14 DIAGNOSIS — M1712 Unilateral primary osteoarthritis, left knee: Secondary | ICD-10-CM | POA: Diagnosis not present

## 2015-06-14 DIAGNOSIS — Z01812 Encounter for preprocedural laboratory examination: Secondary | ICD-10-CM | POA: Diagnosis not present

## 2015-06-14 DIAGNOSIS — I10 Essential (primary) hypertension: Secondary | ICD-10-CM | POA: Diagnosis not present

## 2015-06-14 DIAGNOSIS — Z0183 Encounter for blood typing: Secondary | ICD-10-CM | POA: Diagnosis not present

## 2015-06-14 DIAGNOSIS — Z01818 Encounter for other preprocedural examination: Secondary | ICD-10-CM | POA: Diagnosis present

## 2015-06-14 HISTORY — DX: Unspecified staphylococcus as the cause of diseases classified elsewhere: B95.8

## 2015-06-14 HISTORY — DX: Presence of spectacles and contact lenses: Z97.3

## 2015-06-14 LAB — PROTIME-INR
INR: 0.95 (ref 0.00–1.49)
Prothrombin Time: 12.9 seconds (ref 11.6–15.2)

## 2015-06-14 LAB — SURGICAL PCR SCREEN
MRSA, PCR: NEGATIVE
Staphylococcus aureus: NEGATIVE

## 2015-06-14 LAB — CBC
HCT: 39.2 % (ref 36.0–46.0)
Hemoglobin: 12.8 g/dL (ref 12.0–15.0)
MCH: 28.5 pg (ref 26.0–34.0)
MCHC: 32.7 g/dL (ref 30.0–36.0)
MCV: 87.3 fL (ref 78.0–100.0)
Platelets: 333 10*3/uL (ref 150–400)
RBC: 4.49 MIL/uL (ref 3.87–5.11)
RDW: 14.3 % (ref 11.5–15.5)
WBC: 5.9 10*3/uL (ref 4.0–10.5)

## 2015-06-14 LAB — URINALYSIS, ROUTINE W REFLEX MICROSCOPIC
Bilirubin Urine: NEGATIVE
Glucose, UA: NEGATIVE mg/dL
Hgb urine dipstick: NEGATIVE
Ketones, ur: NEGATIVE mg/dL
Leukocytes, UA: NEGATIVE
Nitrite: NEGATIVE
Protein, ur: NEGATIVE mg/dL
Specific Gravity, Urine: 1.015 (ref 1.005–1.030)
pH: 7 (ref 5.0–8.0)

## 2015-06-14 LAB — BASIC METABOLIC PANEL
Anion gap: 10 (ref 5–15)
BUN: 19 mg/dL (ref 6–20)
CO2: 29 mmol/L (ref 22–32)
Calcium: 9.7 mg/dL (ref 8.9–10.3)
Chloride: 104 mmol/L (ref 101–111)
Creatinine, Ser: 0.96 mg/dL (ref 0.44–1.00)
GFR calc Af Amer: >60 mL/min (ref >60)
GFR calc non Af Amer: >60 mL/min (ref >60)
Glucose, Bld: 93 mg/dL (ref 65–99)
Potassium: 4.4 mmol/L (ref 3.5–5.1)
Sodium: 143 mmol/L (ref 135–145)

## 2015-06-14 LAB — APTT: aPTT: 37 seconds (ref 24–37)

## 2015-06-14 NOTE — Patient Instructions (Signed)
[name]  06/14/2015   Your procedure is scheduled on: Tuesday June 19, 2015   Report to [facility] Main  Entrance take [place]  elevators to 3rd floor to  Short Stay Center at 11:15AM.  Call this number if you have problems the morning of surgery [phone]   Remember: ONLY 1 PERSON MAY GO WITH YOU TO SHORT STAY TO GET  READY MORNING OF YOUR SURGERY.  Do not eat food After Midnight but may take clear liquids till 8:15am day of surgery then nothing by mouth.      Take these medicines the morning of surgery with A SIP OF WATER: Alprazolam (Xanax) if needed; Bupropion (Wellbutrin); Esomeprazole (Nexium); Pramipezole (Mirapex)                               You may not have any metal on your body including hair pins and              piercings  Do not wear jewelry, make-up, lotions, powders or perfumes, deodorant             Do not wear nail polish.  Do not shave  48 hours prior to surgery.                Do not bring valuables to the hospital. [facility] IS NOT             RESPONSIBLE   FOR VALUABLES.  Contacts, dentures or bridgework may not be worn into surgery.  Leave suitcase in the car. After surgery it may be brought to your room.                Please read over the following fact sheets you were given:MRSA INFORMATION SHEET; INCENTIVE SPIROMETER; BLOOD TRANSFUSION INFORMATION SHEET  _____________________________________________________________________             [facility] - Preparing for Surgery Before surgery, you can play an important role.  Because skin is not sterile, your skin needs to be as free of germs as possible.  You can reduce the number of germs on your skin by washing with CHG (chlorahexidine gluconate) soap before surgery.  CHG is an antiseptic cleaner which kills germs and bonds with the skin to continue killing germs even after washing. Please DO NOT use if you have an allergy to CHG or antibacterial soaps.  If your skin  becomes reddened/irritated stop using the CHG and inform your nurse when you arrive at Short Stay. Do not shave (including legs and underarms) for at least 48 hours prior to the first CHG shower.  You may shave your face/neck. Please follow these instructions carefully:  1.  Shower with CHG Soap the night before surgery and the  morning of Surgery.  2.  If you choose to wash your hair, wash your hair first as usual with your  normal  shampoo.  3.  After you shampoo, rinse your hair and body thoroughly to remove the  shampoo.                           4.  Use CHG as you would any other liquid soap.  You can apply chg directly  to the skin and wash  Gently with a scrungie or clean washcloth.  5.  Apply the CHG Soap to your body ONLY FROM THE NECK DOWN.   Do not use on face/ open                           Wound or open sores. Avoid contact with eyes, ears mouth and genitals (private parts).                       Wash face,  Genitals (private parts) with your normal soap.             6.  Wash thoroughly, paying special attention to the area where your surgery  will be performed.  7.  Thoroughly rinse your body with warm water from the neck down.  8.  DO NOT shower/wash with your normal soap after using and rinsing off  the CHG Soap.                9.  Pat yourself dry with a clean towel.            10.  Wear clean pajamas.            11.  Place clean sheets on your bed the night of your first shower and do not  sleep with pets. Day of Surgery : Do not apply any lotions/deodorants the morning of surgery.  Please wear clean clothes to the hospital/surgery center.  FAILURE TO FOLLOW THESE INSTRUCTIONS MAY RESULT IN THE CANCELLATION OF YOUR SURGERY PATIENT SIGNATURE_________________________________  NURSE SIGNATURE__________________________________  ________________________________________________________________________    CLEAR LIQUID DIET   Foods Allowed                                                                      Foods Excluded  Coffee and tea, regular and decaf                             liquids that you cannot  Plain Jell-O in any flavor                                             see through such as: Fruit ices (not with fruit pulp)                                     milk, soups, orange juice  Iced Popsicles                                    All solid food Carbonated beverages, regular and diet                                    Cranberry, grape and apple juices Sports drinks like Gatorade Lightly seasoned clear broth or consume(fat free) Sugar, honey syrup  Sample Menu Breakfast                                Lunch                                     Supper Cranberry juice                    Beef broth                            Chicken broth Jell-O                                     Grape juice                           Apple juice Coffee or tea                        Jell-O                                      Popsicle                                                Coffee or tea                        Coffee or tea  _____________________________________________________________________    Incentive Spirometer  An incentive spirometer is a tool that can help keep your lungs clear and active. This tool measures how well you are filling your lungs with each breath. Taking long deep breaths may help reverse or decrease the chance of developing breathing (pulmonary) problems (especially infection) following:  A long period of time when you are unable to move or be active. BEFORE THE PROCEDURE   If the spirometer includes an indicator to show your best effort, your nurse or respiratory therapist will set it to a desired goal.  If possible, sit up straight or lean slightly forward. Try not to slouch.  Hold the incentive spirometer in an upright position. INSTRUCTIONS FOR USE   Sit on the edge of your bed if possible, or sit up as far  as you can in bed or on a chair.  Hold the incentive spirometer in an upright position.  Breathe out normally.  Place the mouthpiece in your mouth and seal your lips tightly around it.  Breathe in slowly and as deeply as possible, raising the piston or the ball toward the top of the column.  Hold your breath for 3-5 seconds or for as long as possible. Allow the piston or ball to fall to the bottom of the column.  Remove the mouthpiece from your mouth and breathe out normally.  Rest for a few seconds and repeat Steps 1 through 7 at least 10 times every 1-2 hours when you are awake. Take your time and take a few normal breaths between  deep breaths.  The spirometer may include an indicator to show your best effort. Use the indicator as a goal to work toward during each repetition.  After each set of 10 deep breaths, practice coughing to be sure your lungs are clear. If you have an incision (the cut made at the time of surgery), support your incision when coughing by placing a pillow or rolled up towels firmly against it. Once you are able to get out of bed, walk around indoors and cough well. You may stop using the incentive spirometer when instructed by your caregiver.  RISKS AND COMPLICATIONS  Take your time so you do not get dizzy or light-headed.  If you are in pain, you may need to take or ask for pain medication before doing incentive spirometry. It is harder to take a deep breath if you are having pain. AFTER USE  Rest and breathe slowly and easily.  It can be helpful to keep track of a log of your progress. Your caregiver can provide you with a simple table to help with this. If you are using the spirometer at home, follow these instructions: [place] IF:   You are having difficultly using the spirometer.  You have trouble using the spirometer as often as instructed.  Your pain medication is not giving enough relief while using the spirometer.  You develop fever  of 100.5 F (38.1 C) or higher. SEEK IMMEDIATE MEDICAL CARE IF:   You cough up bloody sputum that had not been present before.  You develop fever of 102 F (38.9 C) or greater.  You develop worsening pain at or near the incision site. MAKE SURE YOU:   Understand these instructions.  Will watch your condition.  Will get help right away if you are not doing well or get worse. Document Released: 10/20/2006 Document Revised: 09/01/2011 Document Reviewed: 12/21/2006 ExitCare Patient Information 2014 ExitCare, [place].   ________________________________________________________________________  WHAT IS A BLOOD TRANSFUSION? Blood Transfusion Information  A transfusion is the replacement of blood or some of its parts. Blood is made up of multiple cells which provide different functions.  Red blood cells carry oxygen and are used for blood loss replacement.  White blood cells fight against infection.  Platelets control bleeding.  Plasma helps clot blood.  Other blood products are available for specialized needs, such as hemophilia or other clotting disorders. BEFORE THE TRANSFUSION  Who gives blood for transfusions?   Healthy volunteers who are fully evaluated to make sure their blood is safe. This is blood bank blood. Transfusion therapy is the safest it has ever been in the practice of medicine. Before blood is taken from a donor, a complete history is taken to make sure that person has no history of diseases nor engages in risky social behavior (examples are intravenous drug use or sexual activity with multiple partners). The donor's travel history is screened to minimize risk of transmitting infections, such as malaria. The donated blood is tested for signs of infectious diseases, such as HIV and hepatitis. The blood is then tested to be sure it is compatible with you in order to minimize the chance of a transfusion reaction. If you or a relative donates blood, this is often done in  anticipation of surgery and is not appropriate for emergency situations. It takes many days to process the donated blood. RISKS AND COMPLICATIONS Although transfusion therapy is very safe and saves many lives, the main dangers of transfusion include:   Getting an infectious disease.  Developing a transfusion reaction. This is an allergic reaction to something in the blood you were given. Every precaution is taken to prevent this. The decision to have a blood transfusion has been considered carefully by your caregiver before blood is given. Blood is not given unless the benefits outweigh the risks. AFTER THE TRANSFUSION  Right after receiving a blood transfusion, you will usually feel much better and more energetic. This is especially true if your red blood cells have gotten low (anemic). The transfusion raises the level of the red blood cells which carry oxygen, and this usually causes an energy increase.  The nurse administering the transfusion will monitor you carefully for complications. HOME CARE INSTRUCTIONS  No special instructions are needed after a transfusion. You may find your energy is better. Speak with your caregiver about any limitations on activity for underlying diseases you may have. SEEK MEDICAL CARE IF:   Your condition is not improving after your transfusion.  You develop redness or irritation at the intravenous (IV) site. SEEK IMMEDIATE MEDICAL CARE IF:  Any of the following symptoms occur over the next 12 hours:  Shaking chills.  You have a temperature by mouth above 102 F (38.9 C), not controlled by medicine.  Chest, back, or muscle pain.  People around you feel you are not acting correctly or are confused.  Shortness of breath or difficulty breathing.  Dizziness and fainting.  You get a rash or develop hives.  You have a decrease in urine output.  Your urine turns a dark color or changes to pink, red, or brown. Any of the following symptoms occur over  the next 10 days:  You have a temperature by mouth above 102 F (38.9 C), not controlled by medicine.  Shortness of breath.  Weakness after normal activity.  The white part of the eye turns yellow (jaundice).  You have a decrease in the amount of urine or are urinating less often.  Your urine turns a dark color or changes to pink, red, or brown. Document Released: 06/06/2000 Document Revised: 09/01/2011 Document Reviewed: 01/24/2008 [facility] Patient Information 2014 [place], [place].  _______________________________________________________________________

## 2015-06-19 ENCOUNTER — Encounter (HOSPITAL_COMMUNITY): Payer: Self-pay | Admitting: *Deleted

## 2015-06-19 ENCOUNTER — Inpatient Hospital Stay (HOSPITAL_COMMUNITY)
Admission: RE | Admit: 2015-06-19 | Discharge: 2015-06-20 | DRG: 470 | Disposition: A | Discharge status: Home Health | Payer: BLUE CROSS/BLUE SHIELD | Source: Ambulatory Visit | Attending: Orthopedic Surgery | Admitting: Orthopedic Surgery

## 2015-06-19 ENCOUNTER — Inpatient Hospital Stay (HOSPITAL_COMMUNITY): Payer: BLUE CROSS/BLUE SHIELD | Admitting: Anesthesiology

## 2015-06-19 ENCOUNTER — Encounter (HOSPITAL_COMMUNITY)
Admission: RE | Disposition: A | Discharge status: Home Health | Payer: Self-pay | Source: Ambulatory Visit | Attending: Orthopedic Surgery

## 2015-06-19 DIAGNOSIS — M659 Synovitis and tenosynovitis, unspecified: Secondary | ICD-10-CM | POA: Diagnosis present

## 2015-06-19 DIAGNOSIS — Z90710 Acquired absence of both cervix and uterus: Secondary | ICD-10-CM | POA: Diagnosis not present

## 2015-06-19 DIAGNOSIS — G2581 Restless legs syndrome: Secondary | ICD-10-CM | POA: Diagnosis present

## 2015-06-19 DIAGNOSIS — K219 Gastro-esophageal reflux disease without esophagitis: Secondary | ICD-10-CM | POA: Diagnosis present

## 2015-06-19 DIAGNOSIS — M797 Fibromyalgia: Secondary | ICD-10-CM | POA: Diagnosis present

## 2015-06-19 DIAGNOSIS — M25562 Pain in left knee: Secondary | ICD-10-CM | POA: Diagnosis present

## 2015-06-19 DIAGNOSIS — I10 Essential (primary) hypertension: Secondary | ICD-10-CM | POA: Diagnosis present

## 2015-06-19 DIAGNOSIS — Z96651 Presence of right artificial knee joint: Secondary | ICD-10-CM | POA: Diagnosis present

## 2015-06-19 DIAGNOSIS — Z981 Arthrodesis status: Secondary | ICD-10-CM

## 2015-06-19 DIAGNOSIS — E039 Hypothyroidism, unspecified: Secondary | ICD-10-CM | POA: Diagnosis present

## 2015-06-19 DIAGNOSIS — M1712 Unilateral primary osteoarthritis, left knee: Principal | ICD-10-CM | POA: Diagnosis present

## 2015-06-19 DIAGNOSIS — Z6841 Body Mass Index (BMI) 40.0 and over, adult: Secondary | ICD-10-CM

## 2015-06-19 DIAGNOSIS — Z96652 Presence of left artificial knee joint: Secondary | ICD-10-CM

## 2015-06-19 DIAGNOSIS — Z96659 Presence of unspecified artificial knee joint: Secondary | ICD-10-CM

## 2015-06-19 HISTORY — PX: TOTAL KNEE ARTHROPLASTY: SHX125

## 2015-06-19 LAB — TYPE AND SCREEN
ABO/RH(D): O POS
Antibody Screen: NEGATIVE

## 2015-06-19 SURGERY — ARTHROPLASTY, KNEE, TOTAL
Anesthesia: General | Site: Knee | Laterality: Left

## 2015-06-19 MED ORDER — HYDROMORPHONE HCL 1 MG/ML IJ SOLN
INTRAMUSCULAR | Status: AC
  Filled 2015-06-19: qty 1

## 2015-06-19 MED ORDER — MENTHOL 3 MG MT LOZG: 1.0000 | LOZENGE | OROMUCOSAL | Status: DC | PRN

## 2015-06-19 MED ORDER — POLYETHYLENE GLYCOL 3350 17 G PO PACK
17.0000 g | PACK | Freq: Two times a day (BID) | ORAL | Status: DC
  Administered 2015-06-19 – 2015-06-20 (×2): 17 g via ORAL

## 2015-06-19 MED ORDER — FENTANYL CITRATE (PF) 100 MCG/2ML IJ SOLN
INTRAMUSCULAR | Status: DC | PRN
  Administered 2015-06-19 (×2): 100 ug via INTRAVENOUS

## 2015-06-19 MED ORDER — FENTANYL CITRATE (PF) 100 MCG/2ML IJ SOLN
INTRAMUSCULAR | Status: AC
  Filled 2015-06-19: qty 2

## 2015-06-19 MED ORDER — ONDANSETRON HCL 4 MG/2ML IJ SOLN: 4.0000 mg | Freq: Four times a day (QID) | INTRAMUSCULAR | Status: DC | PRN

## 2015-06-19 MED ORDER — DIPHENHYDRAMINE HCL 25 MG PO CAPS
25.0000 mg | ORAL_CAPSULE | Freq: Four times a day (QID) | ORAL | Status: DC | PRN
  Administered 2015-06-20: 25 mg via ORAL
  Filled 2015-06-19: qty 1

## 2015-06-19 MED ORDER — HYDROCHLOROTHIAZIDE 12.5 MG PO CAPS
12.5000 mg | ORAL_CAPSULE | Freq: Every day | ORAL | Status: DC
  Administered 2015-06-20: 12.5 mg via ORAL
  Filled 2015-06-19: qty 1

## 2015-06-19 MED ORDER — TRANEXAMIC ACID 1000 MG/10ML IV SOLN
1000.0000 mg | INTRAVENOUS | Status: AC
  Administered 2015-06-19: 1000 mg via INTRAVENOUS
  Filled 2015-06-19: qty 10

## 2015-06-19 MED ORDER — DEXAMETHASONE SODIUM PHOSPHATE 10 MG/ML IJ SOLN
INTRAMUSCULAR | Status: DC | PRN
  Administered 2015-06-19: 10 mg via INTRAVENOUS

## 2015-06-19 MED ORDER — HYDROMORPHONE HCL 1 MG/ML IJ SOLN
INTRAMUSCULAR | Status: DC | PRN
  Administered 2015-06-19 (×2): 1 mg via INTRAVENOUS

## 2015-06-19 MED ORDER — LIDOCAINE HCL (CARDIAC) 20 MG/ML IV SOLN
INTRAVENOUS | Status: AC
Start: 2015-06-19 — End: 2015-06-19
  Filled 2015-06-19: qty 5

## 2015-06-19 MED ORDER — METHOCARBAMOL 500 MG PO TABS
500.0000 mg | ORAL_TABLET | Freq: Four times a day (QID) | ORAL | Status: DC | PRN
  Administered 2015-06-20: 500 mg via ORAL
  Filled 2015-06-19: qty 1

## 2015-06-19 MED ORDER — LOSARTAN POTASSIUM-HCTZ 100-12.5 MG PO TABS: 1.0000 | ORAL_TABLET | Freq: Every morning | ORAL | Status: DC

## 2015-06-19 MED ORDER — LOSARTAN POTASSIUM 50 MG PO TABS
100.0000 mg | ORAL_TABLET | Freq: Every day | ORAL | Status: DC
  Administered 2015-06-20: 100 mg via ORAL
  Filled 2015-06-19: qty 2

## 2015-06-19 MED ORDER — CELECOXIB 200 MG PO CAPS
200.0000 mg | ORAL_CAPSULE | Freq: Two times a day (BID) | ORAL | Status: DC
  Administered 2015-06-19 – 2015-06-20 (×2): 200 mg via ORAL
  Filled 2015-06-19 (×4): qty 1

## 2015-06-19 MED ORDER — BUPIVACAINE-EPINEPHRINE (PF) 0.25% -1:200000 IJ SOLN
INTRAMUSCULAR | Status: DC | PRN
Start: 2015-06-19 — End: 2015-06-19
  Administered 2015-06-19: 30 mL

## 2015-06-19 MED ORDER — METHOCARBAMOL 1000 MG/10ML IJ SOLN
500.0000 mg | Freq: Four times a day (QID) | INTRAVENOUS | Status: DC | PRN
  Administered 2015-06-19: 500 mg via INTRAVENOUS
  Filled 2015-06-19 (×2): qty 5

## 2015-06-19 MED ORDER — KETOROLAC TROMETHAMINE 30 MG/ML IJ SOLN
INTRAMUSCULAR | Status: DC | PRN
  Administered 2015-06-19: 30 mg

## 2015-06-19 MED ORDER — PROPOFOL 10 MG/ML IV BOLUS
INTRAVENOUS | Status: AC
  Filled 2015-06-19: qty 20

## 2015-06-19 MED ORDER — MEPERIDINE HCL 50 MG/ML IJ SOLN: 6.2500 mg | INTRAMUSCULAR | Status: DC | PRN

## 2015-06-19 MED ORDER — LIDOCAINE HCL (CARDIAC) 20 MG/ML IV SOLN
INTRAVENOUS | Status: DC | PRN
  Administered 2015-06-19: 100 mg via INTRATRACHEAL

## 2015-06-19 MED ORDER — BUPIVACAINE-EPINEPHRINE (PF) 0.25% -1:200000 IJ SOLN
INTRAMUSCULAR | Status: AC
  Filled 2015-06-19: qty 30

## 2015-06-19 MED ORDER — NON FORMULARY: 20.0000 mg | Freq: Every day | Status: DC

## 2015-06-19 MED ORDER — MIDAZOLAM HCL 5 MG/5ML IJ SOLN
INTRAMUSCULAR | Status: DC | PRN
  Administered 2015-06-19: 2 mg via INTRAVENOUS

## 2015-06-19 MED ORDER — HYDROMORPHONE HCL 1 MG/ML IJ SOLN
0.5000 mg | INTRAMUSCULAR | Status: DC | PRN
  Administered 2015-06-19: 2 mg via INTRAVENOUS
  Administered 2015-06-19 – 2015-06-20 (×2): 1 mg via INTRAVENOUS
  Filled 2015-06-19 (×2): qty 2
  Filled 2015-06-19: qty 1

## 2015-06-19 MED ORDER — LACTATED RINGERS IV SOLN
INTRAVENOUS | Status: DC
  Administered 2015-06-19: 1000 mL via INTRAVENOUS
  Administered 2015-06-19: 15:00:00 via INTRAVENOUS

## 2015-06-19 MED ORDER — ACETAMINOPHEN 10 MG/ML IV SOLN
INTRAVENOUS | Status: AC
  Filled 2015-06-19: qty 100

## 2015-06-19 MED ORDER — SODIUM CHLORIDE 0.9 % IJ SOLN
INTRAMUSCULAR | Status: AC
  Filled 2015-06-19: qty 50

## 2015-06-19 MED ORDER — PROPOFOL 10 MG/ML IV BOLUS
INTRAVENOUS | Status: DC | PRN
  Administered 2015-06-19: 200 mg via INTRAVENOUS

## 2015-06-19 MED ORDER — ASPIRIN EC 325 MG PO TBEC
325.0000 mg | DELAYED_RELEASE_TABLET | Freq: Two times a day (BID) | ORAL | Status: DC
  Administered 2015-06-20: 325 mg via ORAL
  Filled 2015-06-19 (×3): qty 1

## 2015-06-19 MED ORDER — LACTATED RINGERS IV SOLN: INTRAVENOUS | Status: DC

## 2015-06-19 MED ORDER — SODIUM CHLORIDE 0.9 % IJ SOLN
INTRAMUSCULAR | Status: DC | PRN
  Administered 2015-06-19: 30 mL

## 2015-06-19 MED ORDER — KETOROLAC TROMETHAMINE 30 MG/ML IJ SOLN
INTRAMUSCULAR | Status: AC
  Filled 2015-06-19: qty 1

## 2015-06-19 MED ORDER — OXYCODONE HCL 5 MG PO TABS
5.0000 mg | ORAL_TABLET | ORAL | Status: DC
  Administered 2015-06-19 – 2015-06-20 (×6): 15 mg via ORAL
  Filled 2015-06-19 (×6): qty 3

## 2015-06-19 MED ORDER — MAGNESIUM CITRATE PO SOLN: 1.0000 | Freq: Once | ORAL | Status: DC | PRN

## 2015-06-19 MED ORDER — 0.9 % SODIUM CHLORIDE (POUR BTL) OPTIME
TOPICAL | Status: DC | PRN
  Administered 2015-06-19: 1000 mL

## 2015-06-19 MED ORDER — DEXAMETHASONE SODIUM PHOSPHATE 10 MG/ML IJ SOLN
10.0000 mg | Freq: Once | INTRAMUSCULAR | Status: AC
  Administered 2015-06-20: 10 mg via INTRAVENOUS
  Filled 2015-06-19 (×2): qty 1

## 2015-06-19 MED ORDER — PRAMIPEXOLE DIHYDROCHLORIDE 0.125 MG PO TABS
0.1250 mg | ORAL_TABLET | Freq: Three times a day (TID) | ORAL | Status: DC
  Administered 2015-06-19 – 2015-06-20 (×3): 0.125 mg via ORAL
  Filled 2015-06-19 (×5): qty 1

## 2015-06-19 MED ORDER — ONDANSETRON HCL 4 MG/2ML IJ SOLN
INTRAMUSCULAR | Status: DC | PRN
  Administered 2015-06-19: 4 mg via INTRAVENOUS

## 2015-06-19 MED ORDER — ONDANSETRON HCL 4 MG/2ML IJ SOLN
INTRAMUSCULAR | Status: AC
  Filled 2015-06-19: qty 2

## 2015-06-19 MED ORDER — SODIUM CHLORIDE 0.9 % IV SOLN
INTRAVENOUS | Status: DC
  Administered 2015-06-19 – 2015-06-20 (×2): via INTRAVENOUS
  Filled 2015-06-19 (×5): qty 1000

## 2015-06-19 MED ORDER — ESOMEPRAZOLE MAGNESIUM 20 MG PO CPDR
20.0000 mg | DELAYED_RELEASE_CAPSULE | Freq: Every day | ORAL | Status: DC
  Administered 2015-06-20: 20 mg via ORAL
  Filled 2015-06-19 (×2): qty 1

## 2015-06-19 MED ORDER — ALUM & MAG HYDROXIDE-SIMETH 200-200-20 MG/5ML PO SUSP: 30.0000 mL | ORAL | Status: DC | PRN

## 2015-06-19 MED ORDER — PROMETHAZINE HCL 25 MG/ML IJ SOLN: 6.2500 mg | INTRAMUSCULAR | Status: DC | PRN

## 2015-06-19 MED ORDER — ALPRAZOLAM 0.25 MG PO TABS: 0.2500 mg | ORAL_TABLET | Freq: Every day | ORAL | Status: DC | PRN

## 2015-06-19 MED ORDER — SUCCINYLCHOLINE CHLORIDE 20 MG/ML IJ SOLN
INTRAMUSCULAR | Status: DC | PRN
  Administered 2015-06-19: 100 mg via INTRAVENOUS

## 2015-06-19 MED ORDER — PHENOL 1.4 % MT LIQD
1.0000 | OROMUCOSAL | Status: DC | PRN
  Filled 2015-06-19: qty 177

## 2015-06-19 MED ORDER — CHLORHEXIDINE GLUCONATE 4 % EX LIQD
60.0000 mL | Freq: Once | CUTANEOUS | Status: DC
Start: 2015-06-19 — End: 2015-06-19

## 2015-06-19 MED ORDER — HYDROMORPHONE HCL 2 MG/ML IJ SOLN
INTRAMUSCULAR | Status: AC
  Filled 2015-06-19: qty 1

## 2015-06-19 MED ORDER — DOCUSATE SODIUM 100 MG PO CAPS
100.0000 mg | ORAL_CAPSULE | Freq: Two times a day (BID) | ORAL | Status: DC
  Administered 2015-06-19 – 2015-06-20 (×2): 100 mg via ORAL

## 2015-06-19 MED ORDER — ONDANSETRON HCL 4 MG PO TABS: 4.0000 mg | ORAL_TABLET | Freq: Four times a day (QID) | ORAL | Status: DC | PRN

## 2015-06-19 MED ORDER — MIDAZOLAM HCL 2 MG/2ML IJ SOLN
INTRAMUSCULAR | Status: AC
  Filled 2015-06-19: qty 2

## 2015-06-19 MED ORDER — LEVOTHYROXINE SODIUM 200 MCG PO TABS
200.0000 ug | ORAL_TABLET | ORAL | Status: DC
  Administered 2015-06-19: 200 ug via ORAL
  Filled 2015-06-19 (×2): qty 1

## 2015-06-19 MED ORDER — CEFAZOLIN SODIUM-DEXTROSE 2-3 GM-% IV SOLR
2.0000 g | Freq: Four times a day (QID) | INTRAVENOUS | Status: AC
  Administered 2015-06-19 – 2015-06-20 (×2): 2 g via INTRAVENOUS
  Filled 2015-06-19 (×3): qty 50

## 2015-06-19 MED ORDER — METOCLOPRAMIDE HCL 5 MG/ML IJ SOLN: 5.0000 mg | Freq: Three times a day (TID) | INTRAMUSCULAR | Status: DC | PRN

## 2015-06-19 MED ORDER — CEFAZOLIN SODIUM-DEXTROSE 2-3 GM-% IV SOLR
INTRAVENOUS | Status: AC
  Filled 2015-06-19: qty 50

## 2015-06-19 MED ORDER — GABAPENTIN 100 MG PO CAPS
100.0000 mg | ORAL_CAPSULE | Freq: Two times a day (BID) | ORAL | Status: DC
  Administered 2015-06-19 – 2015-06-20 (×2): 100 mg via ORAL
  Filled 2015-06-19 (×3): qty 1

## 2015-06-19 MED ORDER — CEFAZOLIN SODIUM-DEXTROSE 2-3 GM-% IV SOLR
2.0000 g | INTRAVENOUS | Status: AC
  Administered 2015-06-19: 2 g via INTRAVENOUS

## 2015-06-19 MED ORDER — HYDROMORPHONE HCL 1 MG/ML IJ SOLN
0.2500 mg | INTRAMUSCULAR | Status: DC | PRN
  Administered 2015-06-19 (×4): 0.5 mg via INTRAVENOUS

## 2015-06-19 MED ORDER — BUPROPION HCL ER (XL) 300 MG PO TB24
450.0000 mg | ORAL_TABLET | Freq: Every day | ORAL | Status: DC
  Administered 2015-06-20: 450 mg via ORAL
  Filled 2015-06-19 (×2): qty 1

## 2015-06-19 MED ORDER — ESTRADIOL 1 MG PO TABS
0.5000 mg | ORAL_TABLET | Freq: Every day | ORAL | Status: DC
  Administered 2015-06-19: 0.5 mg via ORAL
  Filled 2015-06-19 (×2): qty 0.5

## 2015-06-19 MED ORDER — ACETAMINOPHEN 10 MG/ML IV SOLN
1000.0000 mg | Freq: Once | INTRAVENOUS | Status: AC
  Administered 2015-06-19: 1000 mg via INTRAVENOUS

## 2015-06-19 MED ORDER — BISACODYL 10 MG RE SUPP: 10.0000 mg | Freq: Every day | RECTAL | Status: DC | PRN

## 2015-06-19 MED ORDER — FERROUS SULFATE 325 (65 FE) MG PO TABS
325.0000 mg | ORAL_TABLET | Freq: Three times a day (TID) | ORAL | Status: DC
  Filled 2015-06-19 (×5): qty 1

## 2015-06-19 MED ORDER — METOCLOPRAMIDE HCL 10 MG PO TABS: 5.0000 mg | ORAL_TABLET | Freq: Three times a day (TID) | ORAL | Status: DC | PRN

## 2015-06-19 MED ORDER — STERILE WATER FOR IRRIGATION IR SOLN
Status: DC | PRN
  Administered 2015-06-19: 1000 mL

## 2015-06-19 SURGICAL SUPPLY — 46 items
BAG DECANTER FOR FLEXI CONT (MISCELLANEOUS) IMPLANT
BAG SPEC THK2 15X12 ZIP CLS (MISCELLANEOUS)
BAG ZIPLOCK 12X15 (MISCELLANEOUS) IMPLANT
BANDAGE ACE 6X5 VEL STRL LF (GAUZE/BANDAGES/DRESSINGS) ×2 IMPLANT
BLADE SAW SGTL 13.0X1.19X90.0M (BLADE) ×2 IMPLANT
BOWL SMART MIX CTS (DISPOSABLE) ×2 IMPLANT
CAPT KNEE TOTAL 3 ATTUNE ×2 IMPLANT
CEMENT HV SMART SET (Cement) ×4 IMPLANT
CLOTH BEACON ORANGE TIMEOUT ST (SAFETY) ×2 IMPLANT
CUFF TOURN SGL QUICK 34 (TOURNIQUET CUFF) ×2
CUFF TRNQT CYL 34X4X40X1 (TOURNIQUET CUFF) ×1 IMPLANT
DECANTER SPIKE VIAL GLASS SM (MISCELLANEOUS) ×2 IMPLANT
DRAPE U-SHAPE 47X51 STRL (DRAPES) ×2 IMPLANT
DRSG AQUACEL AG ADV 3.5X10 (GAUZE/BANDAGES/DRESSINGS) ×2 IMPLANT
DURAPREP 26ML APPLICATOR (WOUND CARE) ×4 IMPLANT
ELECT REM PT RETURN 9FT ADLT (ELECTROSURGICAL) ×2
ELECTRODE REM PT RTRN 9FT ADLT (ELECTROSURGICAL) ×1 IMPLANT
GLOVE BIOGEL M 7.0 STRL (GLOVE) IMPLANT
GLOVE BIOGEL PI IND STRL 7.5 (GLOVE) ×1 IMPLANT
GLOVE BIOGEL PI IND STRL 8.5 (GLOVE) ×1 IMPLANT
GLOVE BIOGEL PI INDICATOR 7.5 (GLOVE) ×1
GLOVE BIOGEL PI INDICATOR 8.5 (GLOVE) ×1
GLOVE ECLIPSE 8.0 STRL XLNG CF (GLOVE) ×4 IMPLANT
GLOVE ORTHO TXT STRL SZ7.5 (GLOVE) ×4 IMPLANT
GOWN STRL REUS W/TWL LRG LVL3 (GOWN DISPOSABLE) ×2 IMPLANT
GOWN STRL REUS W/TWL XL LVL3 (GOWN DISPOSABLE) ×2 IMPLANT
HANDPIECE INTERPULSE COAX TIP (DISPOSABLE) ×2
LIQUID BAND (GAUZE/BANDAGES/DRESSINGS) ×2 IMPLANT
MANIFOLD NEPTUNE II (INSTRUMENTS) ×2 IMPLANT
PACK TOTAL KNEE CUSTOM (KITS) ×2 IMPLANT
POSITIONER SURGICAL ARM (MISCELLANEOUS) ×2 IMPLANT
SET HNDPC FAN SPRY TIP SCT (DISPOSABLE) ×1 IMPLANT
SET PAD KNEE POSITIONER (MISCELLANEOUS) ×2 IMPLANT
SUCTION FRAZIER 12FR DISP (SUCTIONS) ×2 IMPLANT
SUT MNCRL AB 4-0 PS2 18 (SUTURE) ×2 IMPLANT
SUT STRATAFIX 1PDS 45CM VIOLET (SUTURE) ×2 IMPLANT
SUT VIC AB 1 CT1 36 (SUTURE) ×4 IMPLANT
SUT VIC AB 2-0 CT1 27 (SUTURE) ×6
SUT VIC AB 2-0 CT1 TAPERPNT 27 (SUTURE) ×3 IMPLANT
SUT VLOC 180 0 24IN GS25 (SUTURE) IMPLANT
SYR 50ML LL SCALE MARK (SYRINGE) ×2 IMPLANT
TRAY FOLEY W/METER SILVER 14FR (SET/KITS/TRAYS/PACK) ×2 IMPLANT
TRAY FOLEY W/METER SILVER 16FR (SET/KITS/TRAYS/PACK) IMPLANT
WATER STERILE IRR 1500ML POUR (IV SOLUTION) ×2 IMPLANT
WRAP KNEE MAXI GEL POST OP (GAUZE/BANDAGES/DRESSINGS) ×2 IMPLANT
YANKAUER SUCT BULB TIP 10FT TU (MISCELLANEOUS) ×2 IMPLANT

## 2015-06-19 NOTE — Transfer of Care (Signed)
Immediate Anesthesia Transfer of Care Note  Patient: [name]  Procedure(s) Performed: Procedure(s): LEFT TOTAL KNEE ARTHROPLASTY (Left)  Patient Location: PACU  Anesthesia Type:General  Level of Consciousness: awake, alert  and oriented  Airway & Oxygen Therapy: Patient Spontanous Breathing and Patient connected to face mask oxygen  Post-op Assessment: Report given to RN and Post -op Vital signs reviewed and stable  Post vital signs: Reviewed and stable  Last Vitals:  Filed Vitals:   06/19/15 1127  BP: 143/80  Pulse: 93  Temp: 36.8 C  Resp: 20    Complications: No apparent anesthesia complications

## 2015-06-19 NOTE — Progress Notes (Signed)
Utilization review completed.  L. J. Tavaughn Silguero RN, BSN, CM 

## 2015-06-19 NOTE — Op Note (Signed)
NAME:  [name]                      MEDICAL RECORD NO.:  [id number]                             FACILITY:  [facility]      PHYSICIAN:  Madlyn Frankel. Charlann Boxer, M.D.  DATE OF BIRTH:  1956-02-08      DATE OF PROCEDURE:  06/19/2015                                     OPERATIVE REPORT         PREOPERATIVE DIAGNOSIS:  Left knee osteoarthritis.      POSTOPERATIVE DIAGNOSIS:  Left knee osteoarthritis.      FINDINGS:  The patient was noted to have complete loss of cartilage and   bone-on-bone arthritis with associated osteophytes in the medial and patellofemoral compartments of   the knee with a significant synovitis and associated effusion.      PROCEDURE:  Left total knee replacement.      COMPONENTS USED:  DePuy Attune rotating platform posterior stabilized knee   system, a size 5 standard femur, 6 tibia, size 8 mm PS AOX insert, and 35 anatomic patellar   button.      SURGEON:  Madlyn Frankel. Charlann Boxer, M.D.      ASSISTANT:  Lanney Gins, PA-C.      ANESTHESIA:  General.      SPECIMENS:  None.      COMPLICATION:  None.      DRAINS:  None.  EBL: <50cc      TOURNIQUET TIME:   Total Tourniquet Time Documented: Thigh (Left) - 35 minutes Total: Thigh (Left) - 35 minutes  .      The patient was stable to the recovery room.      INDICATION FOR PROCEDURE:  [name] is a 59 y.o. female patient of   mine.  The patient had been seen, evaluated, and treated conservatively in the   office with medication, activity modification, and injections.  The patient had   radiographic changes of bone-on-bone arthritis with endplate sclerosis and osteophytes noted.      The patient failed conservative measures including medication, injections, and activity modification, and at this point was ready for more definitive measures.   Based on the radiographic changes and failed conservative measures, the patient   decided to proceed with total knee replacement.  Risks of infection,   DVT, component  failure, need for revision surgery, postop course, and   expectations were all   discussed and reviewed.  Consent was obtained for benefit of pain   relief.      PROCEDURE IN DETAIL:  The patient was brought to the operative theater.   Once adequate anesthesia, preoperative antibiotics, 2 gm of Ancef, 1 gm of Tranexamic Acid, and 10 mg of Decadron administered, the patient was positioned supine with the left thigh tourniquet placed.  The  left lower extremity was prepped and draped in sterile fashion.  A time-   out was performed identifying the patient, planned procedure, and   extremity.      The left lower extremity was placed in the [facility] leg holder.  The leg was   exsanguinated, tourniquet elevated to 250 mmHg.  A midline incision was  made followed by median parapatellar arthrotomy.  Following initial   exposure, attention was first directed to the patella.  Precut   measurement was noted to be 21 mm.  I resected down to 13-14 mm and used a   35 patellar button to restore patellar height as well as cover the cut   surface.      The lug holes were drilled and a metal shim was placed to protect the   patella from retractors and saw blades.      At this point, attention was now directed to the femur.  The femoral   canal was opened with a drill, irrigated to try to prevent fat emboli.  An   intramedullary rod was passed at 3 degrees valgus, 9 mm of bone was   resected off the distal femur.  Following this resection, the tibia was   subluxated anteriorly.  Using the extramedullary guide, 2 mm of bone was resected off   the proximal medial tibia.  We confirmed the gap would be   stable medially and laterally with a size 6 mm insert as well as confirmed   the cut was perpendicular in the coronal plane, checking with an alignment rod.      Once this was done, I sized the femur to be a size 5 in the anterior-   posterior dimension, chose a standard component based on medial and    lateral dimension.  The size 5 rotation block was then pinned in   position anterior referenced using the C-clamp to set rotation.  The   anterior, posterior, and  chamfer cuts were made without difficulty nor   notching making certain that I was along the anterior cortex to help   with flexion gap stability.      The final box cut was made off the lateral aspect of distal femur.      At this point, the tibia was sized to be a size 6, the size 6 tray was   then pinned in position through the medial third of the tubercle,   drilled, and keel punched.  Trial reduction was now carried with a 5 femur,  6 tibia, a size 6 then up to the 8 mm insert, and the 35 patella botton.  The knee was brought to   extension, full extension with good flexion stability with the patella   tracking through the trochlea without application of pressure.  Femoral lug holes were drilled.  Given   all these findings, the trial components removed.  Final components were   opened and cement was mixed.  The knee was irrigated with normal saline   solution and pulse lavage.  The synovial lining was   then injected with 30 cc of 0.25% Marcaine with epinephrine and 1 cc of Toradol plus 30 cc of NS for a   total of 61 cc.      The knee was irrigated.  Final implants were then cemented onto clean and   dried cut surfaces of bone with the knee brought to extension with a size 8 mm trial insert.      Once the cement had fully cured, the excess cement was removed   throughout the knee.  I confirmed I was satisfied with the range of   motion and stability, and the final size 8 mm PS AOX insert was chosen.  It was   placed into the knee.      The tourniquet had been let down  at 35 minutes.  No significant   hemostasis required.  The   extensor mechanism was then reapproximated using #1 Vicryl #0 Quill sutures with the knee   in flexion.  The   remaining wound was closed with 2-0 Vicryl and running 4-0 Monocryl.   The knee  was cleaned, dried, dressed sterilely using Dermabond and   Aquacel dressing.  The patient was then   brought to recovery room in stable condition, tolerating the procedure   well.   Please note that Physician Assistant, Lanney Gins, PA-C, was present for the entirety of the case, and was utilized for pre-operative positioning, peri-operative retractor management, general facilitation of the procedure.  He was also utilized for primary wound closure at the end of the case.              Madlyn Frankel Charlann Boxer, M.D.    06/19/2015 3:08 PM

## 2015-06-19 NOTE — Anesthesia Procedure Notes (Signed)
Procedure Name: Intubation Date/Time: 06/19/2015 1:18 PM Performed by: [place], Darian Ace C Pre-anesthesia Checklist: Patient identified, Timeout performed, Suction available, Patient being monitored and Emergency Drugs available Patient Re-evaluated:Patient Re-evaluated prior to inductionOxygen Delivery Method: Circle system utilized Preoxygenation: Pre-oxygenation with 100% oxygen Intubation Type: IV induction Ventilation: Mask ventilation without difficulty Laryngoscope Size: Mac and 3 Grade View: Grade I Tube type: Oral Tube size: 7.0 mm Number of attempts: 1 Airway Equipment and Method: Stylet Placement Confirmation: ETT inserted through vocal cords under direct vision,  breath sounds checked- equal and bilateral,  positive ETCO2 and CO2 detector Secured at: 20 cm Tube secured with: Tape Dental Injury: Teeth and Oropharynx as per pre-operative assessment

## 2015-06-19 NOTE — Anesthesia Postprocedure Evaluation (Signed)
Anesthesia Post Note  Patient: [name]  Procedure(s) Performed: Procedure(s) (LRB): LEFT TOTAL KNEE ARTHROPLASTY (Left)  Patient location during evaluation: PACU Anesthesia Type: General Level of consciousness: awake and alert Pain management: pain level controlled Vital Signs Assessment: post-procedure vital signs reviewed and stable Respiratory status: spontaneous breathing, nonlabored ventilation, respiratory function stable and patient connected to nasal cannula oxygen Cardiovascular status: blood pressure returned to baseline and stable Postop Assessment: no signs of nausea or vomiting Anesthetic complications: no    Last Vitals:  Filed Vitals:   06/19/15 1517 06/19/15 1530  BP: 157/72 166/79  Pulse: 105 99  Temp: 36.4 C   Resp: 16 22    Last Pain:  Filed Vitals:   06/19/15 1620  PainSc: 5                  Shelton Silvas

## 2015-06-19 NOTE — Interval H&P Note (Signed)
History and Physical Interval Note:  06/19/2015 12:39 PM  [name]  has presented today for surgery, with the diagnosis of left knee osteoarthritis  The various methods of treatment have been discussed with the patient and family. After consideration of risks, benefits and other options for treatment, the patient has consented to  Procedure(s): LEFT TOTAL KNEE ARTHROPLASTY (Left) as a surgical intervention .  The patient's history has been reviewed, patient examined, no change in status, stable for surgery.  I have reviewed the patient's chart and labs.  Questions were answered to the patient's satisfaction.     Shelda Pal

## 2015-06-19 NOTE — Anesthesia Preprocedure Evaluation (Addendum)
Anesthesia Evaluation  Patient identified by MRN, date of birth, ID band Patient awake    Reviewed: Allergy & Precautions, NPO status , Patient's Chart, lab work & pertinent test results  Airway Mallampati: III  TM Distance: >3 FB Neck ROM: Full    Dental  (+) Teeth Intact   Pulmonary neg pulmonary ROS,    breath sounds clear to auscultation       Cardiovascular hypertension, Pt. on medications  Rhythm:Regular Rate:Normal     Neuro/Psych PSYCHIATRIC DISORDERS Anxiety  Neuromuscular disease    GI/Hepatic GERD  Medicated,  Endo/Other  Hypothyroidism   Renal/GU   negative genitourinary   Musculoskeletal  (+) Arthritis , Osteoarthritis,  Fibromyalgia -  Abdominal   Peds  Hematology   Anesthesia Other Findings   Reproductive/Obstetrics                            Lab Results  Component Value Date   WBC 5.9 06/14/2015   HGB 12.8 06/14/2015   HCT 39.2 06/14/2015   MCV 87.3 06/14/2015   PLT 333 06/14/2015   Lab Results  Component Value Date   CREATININE 0.96 06/14/2015   BUN 19 06/14/2015   NA 143 06/14/2015   K 4.4 06/14/2015   CL 104 06/14/2015   CO2 29 06/14/2015   Lab Results  Component Value Date   INR 0.95 06/14/2015   INR 0.95 02/20/2015   05/2015: EKG: normal sinus rhythm.    Anesthesia Physical Anesthesia Plan  ASA: III  Anesthesia Plan: General   Post-op Pain Management:    Induction: Intravenous  Airway Management Planned: LMA  Additional Equipment:   Intra-op Plan:   Post-operative Plan: Extubation in OR  Informed Consent: I have reviewed the patients History and Physical, chart, labs and discussed the procedure including the risks, benefits and alternatives for the proposed anesthesia with the patient or authorized representative who has indicated his/her understanding and acceptance.   Dental advisory given  Plan Discussed with: CRNA  Anesthesia  Plan Comments: (Ms. Proffit would not like spinal or adductor canal block. )       Anesthesia Quick Evaluation

## 2015-06-20 ENCOUNTER — Encounter (HOSPITAL_COMMUNITY): Payer: Self-pay | Admitting: Orthopedic Surgery

## 2015-06-20 LAB — BASIC METABOLIC PANEL
Anion gap: 6 (ref 5–15)
BUN: 13 mg/dL (ref 6–20)
CO2: 29 mmol/L (ref 22–32)
Calcium: 8.8 mg/dL — ABNORMAL LOW (ref 8.9–10.3)
Chloride: 102 mmol/L (ref 101–111)
Creatinine, Ser: 0.78 mg/dL (ref 0.44–1.00)
GFR calc Af Amer: >60 mL/min (ref >60)
GFR calc non Af Amer: >60 mL/min (ref >60)
Glucose, Bld: 143 mg/dL — ABNORMAL HIGH (ref 65–99)
Potassium: 4.2 mmol/L (ref 3.5–5.1)
Sodium: 137 mmol/L (ref 135–145)

## 2015-06-20 LAB — CBC
HCT: 35 % — ABNORMAL LOW (ref 36.0–46.0)
Hemoglobin: 11.4 g/dL — ABNORMAL LOW (ref 12.0–15.0)
MCH: 28.2 pg (ref 26.0–34.0)
MCHC: 32.6 g/dL (ref 30.0–36.0)
MCV: 86.6 fL (ref 78.0–100.0)
Platelets: 279 10*3/uL (ref 150–400)
RBC: 4.04 MIL/uL (ref 3.87–5.11)
RDW: 14.2 % (ref 11.5–15.5)
WBC: 12.5 10*3/uL — ABNORMAL HIGH (ref 4.0–10.5)

## 2015-06-20 MED ORDER — DOCUSATE SODIUM 100 MG PO CAPS: 100.0000 mg | ORAL_CAPSULE | Freq: Two times a day (BID) | ORAL | Status: DC

## 2015-06-20 MED ORDER — METHOCARBAMOL 500 MG PO TABS: 500.0000 mg | ORAL_TABLET | Freq: Four times a day (QID) | ORAL | Status: DC | PRN

## 2015-06-20 MED ORDER — POLYETHYLENE GLYCOL 3350 17 G PO PACK: 17.0000 g | PACK | Freq: Two times a day (BID) | ORAL | Status: DC

## 2015-06-20 MED ORDER — FERROUS SULFATE 325 (65 FE) MG PO TABS: 325.0000 mg | ORAL_TABLET | Freq: Three times a day (TID) | ORAL | Status: DC

## 2015-06-20 MED ORDER — OXYCODONE HCL 5 MG PO TABS: 5.0000 mg | ORAL_TABLET | ORAL | Status: DC | PRN

## 2015-06-20 MED ORDER — ASPIRIN 325 MG PO TBEC: 325.0000 mg | DELAYED_RELEASE_TABLET | Freq: Two times a day (BID) | ORAL | Status: AC

## 2015-06-20 NOTE — Progress Notes (Signed)
Physical Therapy Treatment Patient Details Name: [name] MRN: [id number] DOB: January 16, 1956 Today's Date: 06/20/2015    History of Present Illness LTKA    PT Comments    Patient has practiced steps, will rest and return for final session this PM.  Follow Up Recommendations  Home health PT;Supervision/Assistance - 24 hour     Equipment Recommendations  None recommended by PT    Recommendations for Other Services       Precautions / Restrictions Precautions Precautions: Knee    Mobility  Bed Mobility   Bed Mobility: Sit to Supine       Sit to supine: Min guard      Transfers Overall transfer level: Needs assistance Equipment used: Rolling walker (2 wheeled) Transfers: Sit to/from Stand Sit to Stand: Supervision         General transfer comment: onto bed  Ambulation/Gait Ambulation/Gait assistance: Min guard Ambulation Distance (Feet): 50 Feet Assistive device: Rolling walker (2 wheeled) Gait Pattern/deviations: Step-to pattern;Antalgic     General Gait Details: cues for weight bearing   Stairs Stairs: Yes Stairs assistance: Min assist Stair Management: One rail Right;Step to pattern;Forwards;With cane Number of Stairs: 2 General stair comments: simulated a cane ton the Left.  Wheelchair Mobility    Modified Rankin (Stroke Patients Only)       Balance                                    Cognition Arousal/Alertness: Awake/alert                          Exercises    General Comments        Pertinent Vitals/Pain Pain Score: 7  Pain Location: L knee Pain Descriptors / Indicators: Aching;Discomfort Pain Intervention(s): Limited activity within patient's tolerance;Monitored during session;Premedicated before session;Repositioned;Ice applied    Home Living                      Prior Function            PT Goals (current goals can now be found in the care plan section) Progress towards PT  goals: Progressing toward goals    Frequency  7X/week    PT Plan Current plan remains appropriate    Co-evaluation             End of Session   Activity Tolerance: Patient tolerated treatment well Patient left: in bed;with call bell/phone within reach     Time: 5885-0277 PT Time Calculation (min) (ACUTE ONLY): 14 min  Charges:  $Gait Training: 8-22 mins                    G Codes:      Rada Hay 06/20/2015, 5:17 PM

## 2015-06-20 NOTE — Progress Notes (Signed)
     Subjective: 1 Day Post-Op Procedure(s) (LRB): LEFT TOTAL KNEE ARTHROPLASTY (Left)   Patient reports pain as mild, pain controlled. No events throughout the night. She has been through the recovery process on the other knee already.  Looking forward to getting better with the left knee.  Ready to be discharged home if she does well with PT and pain stays controlled.   Objective:   VITALS:   Filed Vitals:   06/20/15 0125 06/20/15 0515  BP: 115/57 112/57  Pulse: 82 93  Temp: 97.6 F (36.4 C) 98.5 F (36.9 C)  Resp: 20 16    Dorsiflexion/Plantar flexion intact Incision: dressing C/D/I No cellulitis present Compartment soft  LABS  Recent Labs  06/20/15 0430  HGB 11.4*  HCT 35.0*  WBC 12.5*  PLT 279     Recent Labs  06/20/15 0430  NA 137  K 4.2  BUN 13  CREATININE 0.78  GLUCOSE 143*     Assessment/Plan: 1 Day Post-Op Procedure(s) (LRB): LEFT TOTAL KNEE ARTHROPLASTY (Left) Foley cath d/c'ed Advance diet Up with therapy D/C IV fluids Discharge home with home health Follow up in 2 weeks at [facility]. Follow up with OLIN,Armondo Cech D in 2 weeks.  Contact information:  [facility] [address], [address] [place] [address] [phone]    Morbid Obesity (BMI >40)  Estimated body mass index is 40.02 kg/(m^2) as calculated from the following:   Height as of this encounter: 5\' 5"  (1.651 m).   Weight as of this encounter: 109.09 kg (240 lb 8 oz). Patient also counseled that weight may inhibit the healing process Patient counseled that losing weight will help with future health issues     . Tavis Kring   PAC  06/20/2015, 8:08 AM

## 2015-06-20 NOTE — Progress Notes (Signed)
Physical Therapy Treatment Patient Details Name: [name] MRN: [id number] DOB: October 12, 1955 Today's Date: 06/20/2015    History of Present Illness LTKA    PT Comments    Patient is ready for DC.  Follow Up Recommendations  Home health PT;Supervision/Assistance - 24 hour     Equipment Recommendations  None recommended by PT    Recommendations for Other Services       Precautions / Restrictions Precautions Precautions: Knee    Mobility  Bed Mobility                  Wheelchair Mobility    Modified Rankin (Stroke Patients Only)       Balance                                    Cognition Arousal/Alertness: Awake/alert                          Exercises Total Joint Exercises Ankle Circles/Pumps: AROM;Both;10 reps;Supine Quad Sets: AROM;Both;5 reps;Supine Towel Squeeze: AROM;Both;10 reps;Supine Short Arc Quad: AROM;Left;10 reps;Supine Heel Slides: AAROM;Left;5 reps;Supine Hip ABduction/ADduction: AROM;Left;10 reps;Supine Straight Leg Raises: AAROM;Left;5 reps;Supine    General Comments        Pertinent Vitals/Pain Pain Score: 7  Pain Location: L knee Pain Descriptors / Indicators: Aching;Discomfort Pain Intervention(s): Limited activity within patient's tolerance;Monitored during session;Premedicated before session;Repositioned;Ice applied    Home Living                      Prior Function            PT Goals (current goals can now be found in the care plan section) Progress towards PT goals: Progressing toward goals    Frequency  7X/week    PT Plan Current plan remains appropriate    Co-evaluation             End of Session   Activity Tolerance: Patient tolerated treatment well Patient left: in bed;with call bell/phone within reach;with family/visitor present     Time: 2637-8588 PT Time Calculation (min) (ACUTE ONLY): 17 min  Charges:  $Gait Training: 8-22 mins $Therapeutic  Exercise: 8-22 mins                    G Codes:      Rada Hay 06/20/2015, 5:20 PM

## 2015-06-20 NOTE — Discharge Instructions (Signed)

## 2015-06-20 NOTE — Evaluation (Signed)
Physical Therapy Evaluation Patient Details Name: [name] MRN: [id number] DOB: [birth year] Today's Date: 06/20/2015   History of Present Illness  LTKA  Clinical Impression  Patient having increased pain, limited mobility. Will return after medication. Patient will benefit from PT to address problems listed in note below.    Follow Up Recommendations Home health PT;Supervision/Assistance - 24 hour    Equipment Recommendations  None recommended by PT    Recommendations for Other Services       Precautions / Restrictions Precautions Precautions: Knee      Mobility  Bed Mobility Overal bed mobility: Needs Assistance Bed Mobility: Supine to Sit     Supine to sit: Min assist     General bed mobility comments: support  the l LEG TO THE FLOOR.  Transfers Overall transfer level: Needs assistance Equipment used: Rolling walker (2 wheeled) Transfers: Sit to/from [employer] Sit to Stand: Min assist Stand pivot transfers: Mod assist       General transfer comment: difficulty backing up to the recliner. antalgic. decreased weight on the L eft leg 2* pain  Ambulation/Gait                Stairs            Wheelchair Mobility    Modified Rankin (Stroke Patients Only)       Balance                                             Pertinent Vitals/Pain Pain Assessment: 0-10 Pain Score: 5  Pain Location: L knee Pain Descriptors / Indicators: Aching;Discomfort;Grimacing;Guarding Pain Intervention(s): Limited activity within patient's tolerance;Monitored during session;Repositioned;Ice applied;Patient requesting pain meds-RN notified    Home Living Family/patient expects to be discharged to:: Private residence Living Arrangements: Spouse/significant other Available Help at Discharge: Family Type of Home: House Home Access: Stairs to enter Entrance Stairs-Rails: Right Entrance Stairs-Number of Steps: 3 Home Layout:  Able to live on main level with bedroom/bathroom Home Equipment: Walker - 2 wheels;Cane - quad;Bedside commode;Shower seat      Prior Function Level of Independence: Independent               Hand Dominance   Dominant Hand: Right    Extremity/Trunk Assessment   Upper Extremity Assessment: Overall WFL for tasks assessed           Lower Extremity Assessment: LLE deficits/detail   LLE Deficits / Details: knee flexion 45,  Cervical / Trunk Assessment: Normal  Communication   Communication: No difficulties  Cognition Arousal/Alertness: Awake/alert Behavior During Therapy: WFL for tasks assessed/performed Overall Cognitive Status: Within Functional Limits for tasks assessed                      General Comments      Exercises Total Joint Exercises Ankle Circles/Pumps: AROM;Both;10 reps;Supine Quad Sets: AROM;Both;10 reps;Supine Heel Slides: AAROM;Left;5 reps;Supine Straight Leg Raises: AAROM;Left;10 reps;Supine      Assessment/Plan    PT Assessment Patient needs continued PT services  PT Diagnosis Difficulty walking;Acute pain   PT Problem List Decreased strength;Decreased range of motion;Decreased activity tolerance;Decreased mobility;Decreased knowledge of precautions;Decreased safety awareness;Decreased knowledge of use of DME;Pain  PT Treatment Interventions DME instruction;Gait training;Stair training;Functional mobility training;Therapeutic activities;Therapeutic exercise;Patient/family education   PT Goals (Current goals can be found in the Care Plan section) Acute Rehab PT Goals  Patient Stated Goal: to get back to walking PT Goal Formulation: With patient/family Time For Goal Achievement: 06/22/15 Potential to Achieve Goals: Good    Frequency 7X/week   Barriers to discharge        Co-evaluation               End of Session   Activity Tolerance: Patient limited by pain Patient left: in chair;with call bell/phone within  reach;with family/visitor present Nurse Communication: Mobility status         Time: 1497-0263 PT Time Calculation (min) (ACUTE ONLY): 38 min   Charges:   PT Evaluation $Initial PT Evaluation Tier I: 1 Procedure PT Treatments $Gait Training: 8-22 mins $Therapeutic Exercise: 8-22 mins   PT G Codes:        Rada Hay 06/20/2015, 9:23 AM Blanchard Kelch PT [phone]

## 2015-06-22 NOTE — Discharge Summary (Signed)
Physician Discharge Summary  Patient ID: [name] MRN: [id number] DOB/AGE: 60-Dec-1957 60 y.o.  Admit date: 06/19/2015 Discharge date: 06/20/2015   Procedures:  Procedure(s) (LRB): LEFT TOTAL KNEE ARTHROPLASTY (Left)  Attending Physician:  Dr. Durene Romans   Admission Diagnoses:   Left knee primary OA / pain  Discharge Diagnoses:  Principal Problem:   S/P left TKA Active Problems:   S/P knee replacement  Past Medical History  Diagnosis Date  . Hypertension   . Hypothyroidism   . Anxiety   . GERD (gastroesophageal reflux disease)   . Arthritis   . Fibromyalgia   . Restless leg syndrome   . Complication of anesthesia     during surgery she could hear staff talking and could feel them preping her arm over 30 yrs. ago  . Wears glasses   . Staph infection     2015    HPI:    [name], 59 y.o. female, has a history of pain and functional disability in the left knee due to arthritis and has failed non-surgical conservative treatments for greater than 12 weeks to include NSAID's and/or analgesics, corticosteriod injections and activity modification. Onset of symptoms was gradual, starting 5+ years ago with gradually worsening course since that time. The patient noted prior procedures on the knee to include arthroplasty on the right knee on 02/27/2015 per Dr. Charlann Boxer. Patient currently rates pain in the left knee(s) at 8 out of 10 with activity. Patient has worsening of pain with activity and weight bearing, pain that interferes with activities of daily living, pain with passive range of motion, crepitus and joint swelling. Patient has evidence of periarticular osteophytes and joint space narrowing by imaging studies. There is no active infection. Risks, benefits and expectations were discussed with the patient. Risks including but not limited to the risk of anesthesia, blood clots, nerve damage, blood vessel damage, failure of the prosthesis, infection and up to and  including death. Patient understand the risks, benefits and expectations and wishes to proceed with surgery.   PCP: Georgianne Fick, MD   Discharged Condition: good  Hospital Course:  Patient underwent the above stated procedure on 06/19/2015. Patient tolerated the procedure well and brought to the recovery room in good condition and subsequently to the floor.  POD #1 BP: 112/57 ; Pulse: 93 ; Temp: 98.5 F (36.9 C) ; Resp: 16 Patient reports pain as mild, pain controlled. No events throughout the night. She has been through the recovery process on the other knee already. Looking forward to getting better with the left knee. Ready to be discharged home. Dorsiflexion/plantar flexion intact, incision: dressing C/D/I, no cellulitis present and compartment soft.   LABS  Basename    HGB     11.4  HCT     35.0    Discharge Exam: General appearance: alert, cooperative and no distress Extremities: Homans sign is negative, no sign of DVT, no edema, redness or tenderness in the calves or thighs and no ulcers, gangrene or trophic changes  Disposition: Home with follow up in 2 weeks   Follow-up Information    Follow up with Shelda Pal, MD. Schedule an appointment as soon as possible for a visit in 2 weeks.   Specialty:  Orthopedic Surgery   Contact information:   [address] [place] [address] [phone]       Discharge Instructions    Call MD / Call 911    Complete by:  As directed   If you  experience chest pain or shortness of breath, CALL 911 and be transported to the hospital emergency room.  If you develope a fever above 101 F, pus (white drainage) or increased drainage or redness at the wound, or calf pain, call your surgeon's office.     Change dressing    Complete by:  As directed   Maintain surgical dressing until follow up in the clinic. If the edges start to pull up, may reinforce with tape. If the dressing is no longer working, may remove and  cover with gauze and tape, but must keep the area dry and clean.  Call with any questions or concerns.     Constipation Prevention    Complete by:  As directed   Drink plenty of fluids.  Prune juice may be helpful.  You may use a stool softener, such as Colace (over the counter) 100 mg twice a day.  Use MiraLax (over the counter) for constipation as needed.     Diet - low sodium heart healthy    Complete by:  As directed      Discharge instructions    Complete by:  As directed   Maintain surgical dressing until follow up in the clinic. If the edges start to pull up, may reinforce with tape. If the dressing is no longer working, may remove and cover with gauze and tape, but must keep the area dry and clean.  Follow up in 2 weeks at [facility]. Call with any questions or concerns.     Increase activity slowly as tolerated    Complete by:  As directed   Weight bearing as tolerated with assist device (walker, cane, etc) as directed, use it as long as suggested by your surgeon or therapist, typically at least 4-6 weeks.     TED hose    Complete by:  As directed   Use stockings (TED hose) for 2 weeks on both leg(s).  You may remove them at night for sleeping.             Medication List    STOP taking these medications        meloxicam 15 MG tablet  Commonly known as:  MOBIC      TAKE these medications        acetaminophen 325 MG tablet  Commonly known as:  TYLENOL  Take 2 tablets (650 mg total) by mouth every 6 (six) hours as needed for mild pain or moderate pain.     ALPRAZolam 0.25 MG tablet  Commonly known as:  XANAX  Take 0.25 mg by mouth daily as needed for anxiety.     aspirin 325 MG EC tablet  Take 1 tablet (325 mg total) by mouth 2 (two) times daily.     buPROPion 150 MG 24 hr tablet  Commonly known as:  WELLBUTRIN XL  Take 450 mg by mouth daily.     celecoxib 50 MG capsule  Commonly known as:  CELEBREX  Take 50 mg by mouth 2 (two) times daily.      docusate sodium 100 MG capsule  Commonly known as:  COLACE  Take 1 capsule (100 mg total) by mouth 2 (two) times daily.     esomeprazole 20 MG capsule  Commonly known as:  NEXIUM  Take 20 mg by mouth daily at 12 noon.     estradiol 1 MG tablet  Commonly known as:  ESTRACE  Take 0.5 mg by mouth at bedtime.     ferrous sulfate  325 (65 FE) MG tablet  Take 1 tablet (325 mg total) by mouth 3 (three) times daily after meals.     gabapentin 100 MG capsule  Commonly known as:  NEURONTIN  Take 100 mg by mouth 2 (two) times daily.     levothyroxine 200 MCG tablet  Commonly known as:  SYNTHROID, LEVOTHROID  Take 200 mcg by mouth See admin instructions. Takes daily at bedtime 6 days a week except none on Sundays     losartan-hydrochlorothiazide 100-12.5 MG tablet  Commonly known as:  HYZAAR  Take 1 tablet by mouth every morning.     methocarbamol 500 MG tablet  Commonly known as:  ROBAXIN  Take 1 tablet (500 mg total) by mouth every 6 (six) hours as needed for muscle spasms.     oxyCODONE 5 MG immediate release tablet  Commonly known as:  Oxy IR/ROXICODONE  Take 1-3 tablets (5-15 mg total) by mouth every 4 (four) hours as needed for severe pain.     polyethylene glycol packet  Commonly known as:  MIRALAX / GLYCOLAX  Take 17 g by mouth 2 (two) times daily.     pramipexole 0.125 MG tablet  Commonly known as:  MIRAPEX  Take 0.125 mg by mouth 3 (three) times daily.     PROBIOTIC PO  Take 1 tablet by mouth every morning.         Signed: Anastasio Auerbach. Macaila Tahir   PA-C  06/22/2015, 9:26 AM

## 2015-10-03 DIAGNOSIS — Z130 Encounter for screening for diseases of the blood and blood-forming organs and certain disorders involving the immune mechanism: Secondary | ICD-10-CM | POA: Diagnosis not present

## 2015-10-03 DIAGNOSIS — Z01419 Encounter for gynecological examination (general) (routine) without abnormal findings: Secondary | ICD-10-CM | POA: Diagnosis not present

## 2015-10-03 DIAGNOSIS — Z1231 Encounter for screening mammogram for malignant neoplasm of breast: Secondary | ICD-10-CM | POA: Diagnosis not present

## 2015-10-03 DIAGNOSIS — Z79890 Hormone replacement therapy (postmenopausal): Secondary | ICD-10-CM | POA: Diagnosis not present

## 2015-10-03 DIAGNOSIS — Z1389 Encounter for screening for other disorder: Secondary | ICD-10-CM | POA: Diagnosis not present

## 2015-10-03 DIAGNOSIS — N898 Other specified noninflammatory disorders of vagina: Secondary | ICD-10-CM | POA: Diagnosis not present

## 2015-10-08 DIAGNOSIS — I10 Essential (primary) hypertension: Secondary | ICD-10-CM | POA: Diagnosis not present

## 2015-10-08 DIAGNOSIS — E039 Hypothyroidism, unspecified: Secondary | ICD-10-CM | POA: Diagnosis not present

## 2015-10-15 DIAGNOSIS — M150 Primary generalized (osteo)arthritis: Secondary | ICD-10-CM | POA: Diagnosis not present

## 2015-10-15 DIAGNOSIS — I10 Essential (primary) hypertension: Secondary | ICD-10-CM | POA: Diagnosis not present

## 2015-10-15 DIAGNOSIS — E039 Hypothyroidism, unspecified: Secondary | ICD-10-CM | POA: Diagnosis not present

## 2015-10-15 DIAGNOSIS — Z0000 Encounter for general adult medical examination without abnormal findings: Secondary | ICD-10-CM | POA: Diagnosis not present

## 2015-10-18 DIAGNOSIS — Z01419 Encounter for gynecological examination (general) (routine) without abnormal findings: Secondary | ICD-10-CM | POA: Diagnosis not present

## 2015-12-06 DIAGNOSIS — R05 Cough: Secondary | ICD-10-CM | POA: Diagnosis not present

## 2015-12-06 DIAGNOSIS — J301 Allergic rhinitis due to pollen: Secondary | ICD-10-CM | POA: Diagnosis not present

## 2015-12-13 DIAGNOSIS — I10 Essential (primary) hypertension: Secondary | ICD-10-CM | POA: Diagnosis not present

## 2015-12-13 DIAGNOSIS — D508 Other iron deficiency anemias: Secondary | ICD-10-CM | POA: Diagnosis not present

## 2015-12-13 DIAGNOSIS — E784 Other hyperlipidemia: Secondary | ICD-10-CM | POA: Diagnosis not present

## 2015-12-13 DIAGNOSIS — E559 Vitamin D deficiency, unspecified: Secondary | ICD-10-CM | POA: Diagnosis not present

## 2015-12-13 DIAGNOSIS — Z1389 Encounter for screening for other disorder: Secondary | ICD-10-CM | POA: Diagnosis not present

## 2015-12-13 DIAGNOSIS — E038 Other specified hypothyroidism: Secondary | ICD-10-CM | POA: Diagnosis not present

## 2016-02-15 DIAGNOSIS — R21 Rash and other nonspecific skin eruption: Secondary | ICD-10-CM | POA: Diagnosis not present

## 2016-02-15 DIAGNOSIS — H8110 Benign paroxysmal vertigo, unspecified ear: Secondary | ICD-10-CM | POA: Diagnosis not present

## 2016-03-06 DIAGNOSIS — L309 Dermatitis, unspecified: Secondary | ICD-10-CM | POA: Diagnosis not present

## 2016-03-27 DIAGNOSIS — J329 Chronic sinusitis, unspecified: Secondary | ICD-10-CM | POA: Diagnosis not present

## 2016-05-08 DIAGNOSIS — Z471 Aftercare following joint replacement surgery: Secondary | ICD-10-CM | POA: Diagnosis not present

## 2016-05-08 DIAGNOSIS — Z96652 Presence of left artificial knee joint: Secondary | ICD-10-CM | POA: Diagnosis not present

## 2016-05-08 DIAGNOSIS — Z96651 Presence of right artificial knee joint: Secondary | ICD-10-CM | POA: Diagnosis not present

## 2016-05-19 ENCOUNTER — Other Ambulatory Visit: Payer: Self-pay

## 2016-05-19 DIAGNOSIS — L300 Nummular dermatitis: Secondary | ICD-10-CM | POA: Diagnosis not present

## 2016-05-19 DIAGNOSIS — L603 Nail dystrophy: Secondary | ICD-10-CM | POA: Diagnosis not present

## 2016-05-26 DIAGNOSIS — L603 Nail dystrophy: Secondary | ICD-10-CM | POA: Diagnosis not present

## 2016-06-04 DIAGNOSIS — E559 Vitamin D deficiency, unspecified: Secondary | ICD-10-CM | POA: Diagnosis not present

## 2016-06-04 DIAGNOSIS — M170 Bilateral primary osteoarthritis of knee: Secondary | ICD-10-CM | POA: Diagnosis not present

## 2016-06-04 DIAGNOSIS — M19049 Primary osteoarthritis, unspecified hand: Secondary | ICD-10-CM | POA: Diagnosis not present

## 2016-06-04 DIAGNOSIS — E039 Hypothyroidism, unspecified: Secondary | ICD-10-CM | POA: Diagnosis not present

## 2016-06-04 DIAGNOSIS — D508 Other iron deficiency anemias: Secondary | ICD-10-CM | POA: Diagnosis not present

## 2016-07-07 DIAGNOSIS — M2550 Pain in unspecified joint: Secondary | ICD-10-CM | POA: Diagnosis not present

## 2016-07-07 DIAGNOSIS — M7989 Other specified soft tissue disorders: Secondary | ICD-10-CM | POA: Diagnosis not present

## 2016-07-07 DIAGNOSIS — R5382 Chronic fatigue, unspecified: Secondary | ICD-10-CM | POA: Diagnosis not present

## 2016-07-28 DIAGNOSIS — R5382 Chronic fatigue, unspecified: Secondary | ICD-10-CM | POA: Diagnosis not present

## 2016-07-28 DIAGNOSIS — M2550 Pain in unspecified joint: Secondary | ICD-10-CM | POA: Diagnosis not present

## 2016-07-28 DIAGNOSIS — M7989 Other specified soft tissue disorders: Secondary | ICD-10-CM | POA: Diagnosis not present

## 2016-09-10 DIAGNOSIS — M2550 Pain in unspecified joint: Secondary | ICD-10-CM | POA: Diagnosis not present

## 2016-09-10 DIAGNOSIS — M0609 Rheumatoid arthritis without rheumatoid factor, multiple sites: Secondary | ICD-10-CM | POA: Diagnosis not present

## 2016-09-10 DIAGNOSIS — R5382 Chronic fatigue, unspecified: Secondary | ICD-10-CM | POA: Diagnosis not present

## 2016-09-10 DIAGNOSIS — M7989 Other specified soft tissue disorders: Secondary | ICD-10-CM | POA: Diagnosis not present

## 2016-10-13 DIAGNOSIS — J329 Chronic sinusitis, unspecified: Secondary | ICD-10-CM | POA: Diagnosis not present

## 2016-10-13 DIAGNOSIS — R05 Cough: Secondary | ICD-10-CM | POA: Diagnosis not present

## 2016-10-21 DIAGNOSIS — Z1389 Encounter for screening for other disorder: Secondary | ICD-10-CM | POA: Diagnosis not present

## 2016-10-21 DIAGNOSIS — Z79890 Hormone replacement therapy (postmenopausal): Secondary | ICD-10-CM | POA: Diagnosis not present

## 2016-10-21 DIAGNOSIS — Z01419 Encounter for gynecological examination (general) (routine) without abnormal findings: Secondary | ICD-10-CM | POA: Diagnosis not present

## 2016-10-21 DIAGNOSIS — Z130 Encounter for screening for diseases of the blood and blood-forming organs and certain disorders involving the immune mechanism: Secondary | ICD-10-CM | POA: Diagnosis not present

## 2016-10-21 DIAGNOSIS — Z1231 Encounter for screening mammogram for malignant neoplasm of breast: Secondary | ICD-10-CM | POA: Diagnosis not present

## 2016-10-27 DIAGNOSIS — Z01419 Encounter for gynecological examination (general) (routine) without abnormal findings: Secondary | ICD-10-CM | POA: Diagnosis not present

## 2016-11-05 DIAGNOSIS — M2550 Pain in unspecified joint: Secondary | ICD-10-CM | POA: Diagnosis not present

## 2016-11-05 DIAGNOSIS — R5382 Chronic fatigue, unspecified: Secondary | ICD-10-CM | POA: Diagnosis not present

## 2016-11-05 DIAGNOSIS — M7989 Other specified soft tissue disorders: Secondary | ICD-10-CM | POA: Diagnosis not present

## 2016-11-05 DIAGNOSIS — M0609 Rheumatoid arthritis without rheumatoid factor, multiple sites: Secondary | ICD-10-CM | POA: Diagnosis not present

## 2016-11-24 DIAGNOSIS — I10 Essential (primary) hypertension: Secondary | ICD-10-CM | POA: Diagnosis not present

## 2016-11-24 DIAGNOSIS — M797 Fibromyalgia: Secondary | ICD-10-CM | POA: Diagnosis not present

## 2016-11-24 DIAGNOSIS — E039 Hypothyroidism, unspecified: Secondary | ICD-10-CM | POA: Diagnosis not present

## 2016-11-24 DIAGNOSIS — Z0000 Encounter for general adult medical examination without abnormal findings: Secondary | ICD-10-CM | POA: Diagnosis not present

## 2016-12-08 DIAGNOSIS — Z0000 Encounter for general adult medical examination without abnormal findings: Secondary | ICD-10-CM | POA: Diagnosis not present

## 2016-12-08 DIAGNOSIS — J209 Acute bronchitis, unspecified: Secondary | ICD-10-CM | POA: Diagnosis not present

## 2016-12-08 DIAGNOSIS — E039 Hypothyroidism, unspecified: Secondary | ICD-10-CM | POA: Diagnosis not present

## 2016-12-15 DIAGNOSIS — H5213 Myopia, bilateral: Secondary | ICD-10-CM | POA: Diagnosis not present

## 2016-12-17 DIAGNOSIS — E038 Other specified hypothyroidism: Secondary | ICD-10-CM | POA: Diagnosis not present

## 2016-12-17 DIAGNOSIS — D509 Iron deficiency anemia, unspecified: Secondary | ICD-10-CM | POA: Diagnosis not present

## 2016-12-17 DIAGNOSIS — M19049 Primary osteoarthritis, unspecified hand: Secondary | ICD-10-CM | POA: Diagnosis not present

## 2016-12-17 DIAGNOSIS — M129 Arthropathy, unspecified: Secondary | ICD-10-CM | POA: Diagnosis not present

## 2016-12-17 DIAGNOSIS — Z1389 Encounter for screening for other disorder: Secondary | ICD-10-CM | POA: Diagnosis not present

## 2017-01-07 DIAGNOSIS — M7989 Other specified soft tissue disorders: Secondary | ICD-10-CM | POA: Diagnosis not present

## 2017-01-07 DIAGNOSIS — M2550 Pain in unspecified joint: Secondary | ICD-10-CM | POA: Diagnosis not present

## 2017-01-07 DIAGNOSIS — M0609 Rheumatoid arthritis without rheumatoid factor, multiple sites: Secondary | ICD-10-CM | POA: Diagnosis not present

## 2017-01-07 DIAGNOSIS — R5382 Chronic fatigue, unspecified: Secondary | ICD-10-CM | POA: Diagnosis not present

## 2017-02-25 DIAGNOSIS — M2550 Pain in unspecified joint: Secondary | ICD-10-CM | POA: Diagnosis not present

## 2017-02-25 DIAGNOSIS — R5382 Chronic fatigue, unspecified: Secondary | ICD-10-CM | POA: Diagnosis not present

## 2017-02-25 DIAGNOSIS — M0609 Rheumatoid arthritis without rheumatoid factor, multiple sites: Secondary | ICD-10-CM | POA: Diagnosis not present

## 2017-02-25 DIAGNOSIS — M7989 Other specified soft tissue disorders: Secondary | ICD-10-CM | POA: Diagnosis not present

## 2017-03-03 DIAGNOSIS — H9041 Sensorineural hearing loss, unilateral, right ear, with unrestricted hearing on the contralateral side: Secondary | ICD-10-CM | POA: Diagnosis not present

## 2017-04-22 DIAGNOSIS — I10 Essential (primary) hypertension: Secondary | ICD-10-CM | POA: Diagnosis not present

## 2017-04-22 DIAGNOSIS — D509 Iron deficiency anemia, unspecified: Secondary | ICD-10-CM | POA: Diagnosis not present

## 2017-04-22 DIAGNOSIS — Z1389 Encounter for screening for other disorder: Secondary | ICD-10-CM | POA: Diagnosis not present

## 2017-04-22 DIAGNOSIS — E038 Other specified hypothyroidism: Secondary | ICD-10-CM | POA: Diagnosis not present

## 2017-04-22 DIAGNOSIS — M129 Arthropathy, unspecified: Secondary | ICD-10-CM | POA: Diagnosis not present

## 2017-04-27 DIAGNOSIS — M7989 Other specified soft tissue disorders: Secondary | ICD-10-CM | POA: Diagnosis not present

## 2017-04-27 DIAGNOSIS — R5382 Chronic fatigue, unspecified: Secondary | ICD-10-CM | POA: Diagnosis not present

## 2017-04-27 DIAGNOSIS — M0609 Rheumatoid arthritis without rheumatoid factor, multiple sites: Secondary | ICD-10-CM | POA: Diagnosis not present

## 2017-04-27 DIAGNOSIS — M2550 Pain in unspecified joint: Secondary | ICD-10-CM | POA: Diagnosis not present

## 2017-05-22 DIAGNOSIS — R143 Flatulence: Secondary | ICD-10-CM | POA: Diagnosis not present

## 2017-05-22 DIAGNOSIS — R198 Other specified symptoms and signs involving the digestive system and abdomen: Secondary | ICD-10-CM | POA: Diagnosis not present

## 2017-05-22 DIAGNOSIS — R1010 Upper abdominal pain, unspecified: Secondary | ICD-10-CM | POA: Diagnosis not present

## 2017-05-22 DIAGNOSIS — R110 Nausea: Secondary | ICD-10-CM | POA: Diagnosis not present

## 2017-05-27 DIAGNOSIS — Z1211 Encounter for screening for malignant neoplasm of colon: Secondary | ICD-10-CM | POA: Diagnosis not present

## 2017-05-27 DIAGNOSIS — R1013 Epigastric pain: Secondary | ICD-10-CM | POA: Diagnosis not present

## 2017-05-27 DIAGNOSIS — R140 Abdominal distension (gaseous): Secondary | ICD-10-CM | POA: Diagnosis not present

## 2017-05-27 DIAGNOSIS — R197 Diarrhea, unspecified: Secondary | ICD-10-CM | POA: Diagnosis not present

## 2017-06-25 DIAGNOSIS — M0609 Rheumatoid arthritis without rheumatoid factor, multiple sites: Secondary | ICD-10-CM | POA: Diagnosis not present

## 2017-06-25 DIAGNOSIS — M2550 Pain in unspecified joint: Secondary | ICD-10-CM | POA: Diagnosis not present

## 2017-06-25 DIAGNOSIS — M25531 Pain in right wrist: Secondary | ICD-10-CM | POA: Diagnosis not present

## 2017-06-25 DIAGNOSIS — R5382 Chronic fatigue, unspecified: Secondary | ICD-10-CM | POA: Diagnosis not present

## 2017-07-13 DIAGNOSIS — R1310 Dysphagia, unspecified: Secondary | ICD-10-CM | POA: Diagnosis not present

## 2017-07-13 DIAGNOSIS — Z1211 Encounter for screening for malignant neoplasm of colon: Secondary | ICD-10-CM | POA: Diagnosis not present

## 2017-07-13 DIAGNOSIS — R140 Abdominal distension (gaseous): Secondary | ICD-10-CM | POA: Diagnosis not present

## 2017-07-21 DIAGNOSIS — Z1211 Encounter for screening for malignant neoplasm of colon: Secondary | ICD-10-CM | POA: Diagnosis not present

## 2017-07-21 DIAGNOSIS — R1310 Dysphagia, unspecified: Secondary | ICD-10-CM | POA: Diagnosis not present

## 2017-07-21 DIAGNOSIS — D123 Benign neoplasm of transverse colon: Secondary | ICD-10-CM | POA: Diagnosis not present

## 2017-07-21 DIAGNOSIS — K635 Polyp of colon: Secondary | ICD-10-CM | POA: Diagnosis not present

## 2017-08-11 DIAGNOSIS — M2550 Pain in unspecified joint: Secondary | ICD-10-CM | POA: Diagnosis not present

## 2017-08-11 DIAGNOSIS — R5382 Chronic fatigue, unspecified: Secondary | ICD-10-CM | POA: Diagnosis not present

## 2017-08-11 DIAGNOSIS — M0609 Rheumatoid arthritis without rheumatoid factor, multiple sites: Secondary | ICD-10-CM | POA: Diagnosis not present

## 2017-09-01 DIAGNOSIS — J399 Disease of upper respiratory tract, unspecified: Secondary | ICD-10-CM | POA: Diagnosis not present

## 2017-09-24 DIAGNOSIS — H01009 Unspecified blepharitis unspecified eye, unspecified eyelid: Secondary | ICD-10-CM | POA: Diagnosis not present

## 2017-09-24 DIAGNOSIS — L300 Nummular dermatitis: Secondary | ICD-10-CM | POA: Diagnosis not present

## 2017-10-16 DIAGNOSIS — Z978 Presence of other specified devices: Secondary | ICD-10-CM | POA: Diagnosis not present

## 2017-10-16 DIAGNOSIS — M21611 Bunion of right foot: Secondary | ICD-10-CM | POA: Diagnosis not present

## 2017-10-16 DIAGNOSIS — M79671 Pain in right foot: Secondary | ICD-10-CM | POA: Diagnosis not present

## 2017-10-20 DIAGNOSIS — E038 Other specified hypothyroidism: Secondary | ICD-10-CM | POA: Diagnosis not present

## 2017-10-20 DIAGNOSIS — M129 Arthropathy, unspecified: Secondary | ICD-10-CM | POA: Diagnosis not present

## 2017-10-20 DIAGNOSIS — D508 Other iron deficiency anemias: Secondary | ICD-10-CM | POA: Diagnosis not present

## 2017-10-20 DIAGNOSIS — Z1389 Encounter for screening for other disorder: Secondary | ICD-10-CM | POA: Diagnosis not present

## 2017-10-20 DIAGNOSIS — E7849 Other hyperlipidemia: Secondary | ICD-10-CM | POA: Diagnosis not present

## 2017-10-28 DIAGNOSIS — Z1231 Encounter for screening mammogram for malignant neoplasm of breast: Secondary | ICD-10-CM | POA: Diagnosis not present

## 2017-10-28 DIAGNOSIS — Z130 Encounter for screening for diseases of the blood and blood-forming organs and certain disorders involving the immune mechanism: Secondary | ICD-10-CM | POA: Diagnosis not present

## 2017-10-28 DIAGNOSIS — N898 Other specified noninflammatory disorders of vagina: Secondary | ICD-10-CM | POA: Diagnosis not present

## 2017-10-28 DIAGNOSIS — Z1389 Encounter for screening for other disorder: Secondary | ICD-10-CM | POA: Diagnosis not present

## 2017-10-28 DIAGNOSIS — Z01419 Encounter for gynecological examination (general) (routine) without abnormal findings: Secondary | ICD-10-CM | POA: Diagnosis not present

## 2017-11-10 DIAGNOSIS — M2550 Pain in unspecified joint: Secondary | ICD-10-CM | POA: Diagnosis not present

## 2017-11-10 DIAGNOSIS — R5382 Chronic fatigue, unspecified: Secondary | ICD-10-CM | POA: Diagnosis not present

## 2017-11-10 DIAGNOSIS — M0609 Rheumatoid arthritis without rheumatoid factor, multiple sites: Secondary | ICD-10-CM | POA: Diagnosis not present

## 2017-11-18 ENCOUNTER — Emergency Department (INDEPENDENT_AMBULATORY_CARE_PROVIDER_SITE_OTHER)
Admission: EM | Admit: 2017-11-18 | Discharge: 2017-11-18 | Disposition: A | Discharge status: Home / Self Care | Payer: BLUE CROSS/BLUE SHIELD | Source: Home / Self Care | Attending: Family Medicine | Admitting: Family Medicine

## 2017-11-18 ENCOUNTER — Encounter: Payer: Self-pay | Admitting: Emergency Medicine

## 2017-11-18 DIAGNOSIS — J069 Acute upper respiratory infection, unspecified: Secondary | ICD-10-CM | POA: Diagnosis not present

## 2017-11-18 DIAGNOSIS — B9789 Other viral agents as the cause of diseases classified elsewhere: Secondary | ICD-10-CM

## 2017-11-18 MED ORDER — GUAIFENESIN-CODEINE 100-10 MG/5ML PO SOLN: 5.0000 mL | Freq: Three times a day (TID) | ORAL | 0 refills | Status: DC | PRN

## 2017-11-18 NOTE — ED Triage Notes (Signed)
Pt c/o cough, congestion, body aches x2 days. Afebrile.

## 2017-11-18 NOTE — ED Provider Notes (Signed)
[facility]    CSN: [id number] Arrival date & time: 11/18/17  1417     History   Chief Complaint Chief Complaint  Patient presents with  . Cough    HPI [name] is a 62 y.o. female.   HPI  [name] is a 62 y.o. female presenting to UC with c/o 2 days of cough and congestion with body aches and sore throat.  Body aches and cough are most bothersome for the patient as her cough kept her up last night despite taking previously prescribed tessalon pearls.  Denies chest pain or SOB. Denies fever, chills, n/v/d. She reports being around family a lot recently and was with a family member who was dx with bronchitis. She believes he got her sick.  She denies hx of pneumonia, asthma or COPD.   Past Medical History:  Diagnosis Date  . Anxiety   . Arthritis   . Complication of anesthesia    during surgery she could hear staff talking and could feel them preping her arm over 30 yrs. ago  . Fibromyalgia   . GERD (gastroesophageal reflux disease)   . Hypertension   . Hypothyroidism   . Restless leg syndrome   . Staph infection    2015  . Wears glasses     Patient Active Problem List   Diagnosis Date Noted  . S/P left TKA 06/19/2015  . Morbid obesity (HCC) 02/28/2015  . S/P right TKA 02/27/2015  . S/P knee replacement 02/27/2015  . Lumbar surgical wound fluid collection   . Wound drainage   . Staphylococcus aureus infection   . Screen for STD (sexually transmitted disease)   . Wound infection after surgery 04/24/2014  . Spinal stenosis of lumbar region with neurogenic claudication 03/30/2014    Past Surgical History:  Procedure Laterality Date  . ABDOMINAL HYSTERECTOMY  1981   partial  . APPLICATION OF WOUND VAC N/A 04/25/2014   Procedure: APPLICATION OF WOUND VAC;  Surgeon: [name], MD;  Location: [facility];  Service: Neurosurgery;  Laterality: N/A;  . BACK SURGERY  2007  . BLADDER SUSPENSION     2004  . CERVICAL FUSION  1990  .  CERVICAL FUSION     2000  . CYST EXCISION Left 1985   wrist  . CYST EXCISION  1985   chest  . FOOT SURGERY Right 2009   reconstruction with hardware  . FOOT SURGERY Left 2013   reconstruction with hardware  . LUMBAR FUSION  03/2014  . LUMBAR WOUND DEBRIDEMENT N/A 04/25/2014   Procedure: LUMBAR WOUND DEBRIDEMENT Placement of Woundvac);  Surgeon: [name], MD;  Location: [facility];  Service: Neurosurgery;  Laterality: N/A;  . PICC LINE PLACE PERIPHERAL ([facility] HX)     2015-infusion therapy secondary to STAPH infection   . TONSILLECTOMY    . TOTAL KNEE ARTHROPLASTY Bilateral 02/27/2015   Procedure: RIGHT TOTAL KNEE ARTHROPLASTY, LEFT KNEE CORTISONE INJECTION;  Surgeon: Durene Romans, MD;  Location: [facility];  Service: Orthopedics;  Laterality: Bilateral;  . TOTAL KNEE ARTHROPLASTY Left 06/19/2015   Procedure: LEFT TOTAL KNEE ARTHROPLASTY;  Surgeon: Durene Romans, MD;  Location: [facility];  Service: Orthopedics;  Laterality: Left;  . TUBAL LIGATION      OB History   None      Home Medications    Prior to Admission medications   Medication Sig Start Date End Date Taking? Authorizing Provider  acetaminophen (TYLENOL) 325 MG tablet Take 2 tablets (  650 mg total) by mouth every 6 (six) hours as needed for mild pain or moderate pain. 02/28/15   Lanney Gins, PA-C  ALPRAZolam Prudy Feeler) 0.25 MG tablet Take 0.25 mg by mouth daily as needed for anxiety.     [provider]  buPROPion (WELLBUTRIN XL) 150 MG 24 hr tablet Take 450 mg by mouth daily.    [provider]  celecoxib (CELEBREX) 50 MG capsule Take 50 mg by mouth 2 (two) times daily.    [provider]  docusate sodium (COLACE) 100 MG capsule Take 1 capsule (100 mg total) by mouth 2 (two) times daily. 06/20/15   Lanney Gins, PA-C  esomeprazole (NEXIUM) 20 MG capsule Take 20 mg by mouth daily at 12 noon.    [provider]  estradiol (ESTRACE) 1 MG tablet Take 0.5 mg by mouth at bedtime.      [provider]  ferrous sulfate 325 (65 FE) MG tablet Take 1 tablet (325 mg total) by mouth 3 (three) times daily after meals. 06/20/15   Lanney Gins, PA-C  gabapentin (NEURONTIN) 100 MG capsule Take 100 mg by mouth 2 (two) times daily.    [provider]  guaiFENesin-codeine 100-10 MG/5ML syrup Take 5 mLs by mouth 3 (three) times daily as needed for cough. 11/18/17   Lurene Shadow, PA-C  levothyroxine (SYNTHROID, LEVOTHROID) 200 MCG tablet Take 200 mcg by mouth See admin instructions. Takes daily at bedtime 6 days a week except none on Sundays    [provider]  losartan-hydrochlorothiazide (HYZAAR) 100-12.5 MG per tablet Take 1 tablet by mouth every morning.     [provider]  methocarbamol (ROBAXIN) 500 MG tablet Take 1 tablet (500 mg total) by mouth every 6 (six) hours as needed for muscle spasms. 06/20/15   Lanney Gins, PA-C  oxyCODONE (OXY IR/ROXICODONE) 5 MG immediate release tablet Take 1-3 tablets (5-15 mg total) by mouth every 4 (four) hours as needed for severe pain. 06/20/15   Lanney Gins, PA-C  polyethylene glycol (MIRALAX / GLYCOLAX) packet Take 17 g by mouth 2 (two) times daily. 06/20/15   Lanney Gins, PA-C  pramipexole (MIRAPEX) 0.125 MG tablet Take 0.125 mg by mouth 3 (three) times daily.    [provider]  Probiotic Product (PROBIOTIC PO) Take 1 tablet by mouth every morning.    [provider]    Family History History reviewed. No pertinent family history.  Social History Social History   Tobacco Use  . Smoking status: Never Smoker  . Smokeless tobacco: Never Used  Substance Use Topics  . Alcohol use: No    Alcohol/week: 0.0 oz  . Drug use: No     Allergies   Clindamycin/lincomycin; Influenza vaccines; and Lodine [etodolac]   Review of Systems Review of Systems  Constitutional: Negative for chills and fever.  HENT: Positive for congestion, postnasal drip, rhinorrhea and sore throat.  Negative for ear pain, trouble swallowing and voice change.   Respiratory: Positive for cough. Negative for shortness of breath.   Cardiovascular: Negative for chest pain and palpitations.  Gastrointestinal: Negative for abdominal pain, diarrhea, nausea and vomiting.  Musculoskeletal: Positive for arthralgias, back pain and myalgias.  Skin: Negative for rash.     Physical Exam Triage Vital Signs ED Triage Vitals  Enc Vitals Group     BP 11/18/17 1437 (!) 151/84     Pulse --      Resp --      Temp 11/18/17 1437 97.8 F (36.6 C)  Temp Source 11/18/17 1437 Oral     SpO2 11/18/17 1437 99 %     Weight 11/18/17 1438 210 lb (95.3 kg)     Height --      Head Circumference --      Peak Flow --      Pain Score 11/18/17 1438 0     Pain Loc --      Pain Edu? --      Excl. in GC? --    No data found.  Updated Vital Signs BP (!) 151/84 (BP Location: Right Arm)   Temp 97.8 F (36.6 C) (Oral)   Wt 210 lb (95.3 kg)   SpO2 99%   BMI 34.95 kg/m   Visual Acuity Right Eye Distance:   Left Eye Distance:   Bilateral Distance:    Right Eye Near:   Left Eye Near:    Bilateral Near:     Physical Exam  Constitutional: She is oriented to person, place, and time. She appears well-developed and well-nourished. No distress.  HENT:  Head: Normocephalic and atraumatic.  Right Ear: Tympanic membrane normal.  Left Ear: Tympanic membrane normal.  Nose: Nose normal. Right sinus exhibits no maxillary sinus tenderness and no frontal sinus tenderness. Left sinus exhibits no maxillary sinus tenderness and no frontal sinus tenderness.  Mouth/Throat: Uvula is midline, oropharynx is clear and moist and mucous membranes are normal.  Eyes: EOM are normal.  Neck: Normal range of motion. Neck supple.  Cardiovascular: Normal rate and regular rhythm.  Pulmonary/Chest: Effort normal and breath sounds normal. No stridor. No respiratory distress. She has no wheezes. She has no rales.  Musculoskeletal:  Normal range of motion.  Lymphadenopathy:    She has no cervical adenopathy.  Neurological: She is alert and oriented to person, place, and time.  Skin: Skin is warm and dry. She is not diaphoretic.  Psychiatric: She has a normal mood and affect. Her behavior is normal.  Nursing note and vitals reviewed.    UC Treatments / Results  Labs (all labs ordered are listed, but only abnormal results are displayed) Labs Reviewed - No data to display  EKG None  Radiology No results found.  Procedures Procedures (including critical care time)  Medications Ordered in UC Medications - No data to display  Initial Impression / Assessment and Plan / UC Course  I have reviewed the triage vital signs and the nursing notes.  Pertinent labs & imaging results that were available during my care of the patient were reviewed by me and considered in my medical decision making (see chart for details).     No evidence of bacterial infection at this time. Encouraged symptomatic treatment.  Home care instructions provided.  Final Clinical Impressions(s) / UC Diagnoses   Final diagnoses:  Viral URI with cough     Discharge Instructions      Virtussin (guaifenesin-codeine) is a strong narcotic cough medication.  Only take up to 3 times daily as needed for severe cough.  Be sure to take with large glass of water.  It may cause drowsiness. Do not drive, drink alcohol or take other sedating medications such as Nyquil, Benadryl, Tylenol PM, etc. while taking this medication as it can cause difficulty breathing or even death if taking too many sedating medications at the same time.    You may take 500mg  acetaminophen every 4-6 hours or in combination with ibuprofen 400-600mg  every 6-8 hours as needed for pain, inflammation, and fever.  Be sure to drink at  least eight 8oz glasses of water to stay well hydrated and get at least 8 hours of sleep at night, preferably more while sick.   Please follow up  with your family doctor in 5-7 days if not improving, sooner if significantly worsening.     ED Prescriptions    Medication Sig Dispense Auth. Provider   guaiFENesin-codeine 100-10 MG/5ML syrup Take 5 mLs by mouth 3 (three) times daily as needed for cough. 80 mL Lurene Shadow, PA-C     Controlled Substance Prescriptions [place] Controlled Substance Registry consulted? Yes, I have consulted the [place] Controlled Substances Registry for this patient, and feel the risk/benefit ratio today is favorable for proceeding with this prescription for a controlled substance.   Lurene Shadow, [place] 11/18/17 1536

## 2017-11-18 NOTE — Discharge Instructions (Signed)
°  Virtussin (guaifenesin-codeine) is a strong narcotic cough medication.  Only take up to 3 times daily as needed for severe cough.  Be sure to take with large glass of water.  It may cause drowsiness. Do not drive, drink alcohol or take other sedating medications such as Nyquil, Benadryl, Tylenol PM, etc. while taking this medication as it can cause difficulty breathing or even death if taking too many sedating medications at the same time.    You may take 500mg  acetaminophen every 4-6 hours or in combination with ibuprofen 400-600mg  every 6-8 hours as needed for pain, inflammation, and fever.  Be sure to drink at least eight 8oz glasses of water to stay well hydrated and get at least 8 hours of sleep at night, preferably more while sick.   Please follow up with your family doctor in 5-7 days if not improving, sooner if significantly worsening.

## 2017-11-25 DIAGNOSIS — H5213 Myopia, bilateral: Secondary | ICD-10-CM | POA: Diagnosis not present

## 2017-12-28 DIAGNOSIS — L259 Unspecified contact dermatitis, unspecified cause: Secondary | ICD-10-CM | POA: Diagnosis not present

## 2018-01-01 DIAGNOSIS — L259 Unspecified contact dermatitis, unspecified cause: Secondary | ICD-10-CM | POA: Diagnosis not present

## 2018-02-03 DIAGNOSIS — Z0000 Encounter for general adult medical examination without abnormal findings: Secondary | ICD-10-CM | POA: Diagnosis not present

## 2018-02-03 DIAGNOSIS — I10 Essential (primary) hypertension: Secondary | ICD-10-CM | POA: Diagnosis not present

## 2018-02-08 DIAGNOSIS — R5382 Chronic fatigue, unspecified: Secondary | ICD-10-CM | POA: Diagnosis not present

## 2018-02-08 DIAGNOSIS — M2550 Pain in unspecified joint: Secondary | ICD-10-CM | POA: Diagnosis not present

## 2018-02-08 DIAGNOSIS — M0609 Rheumatoid arthritis without rheumatoid factor, multiple sites: Secondary | ICD-10-CM | POA: Diagnosis not present

## 2018-02-10 DIAGNOSIS — Z0000 Encounter for general adult medical examination without abnormal findings: Secondary | ICD-10-CM | POA: Diagnosis not present

## 2018-02-10 DIAGNOSIS — I10 Essential (primary) hypertension: Secondary | ICD-10-CM | POA: Diagnosis not present

## 2018-02-10 DIAGNOSIS — E739 Lactose intolerance, unspecified: Secondary | ICD-10-CM | POA: Diagnosis not present

## 2018-02-10 DIAGNOSIS — M0600 Rheumatoid arthritis without rheumatoid factor, unspecified site: Secondary | ICD-10-CM | POA: Diagnosis not present

## 2018-02-16 DIAGNOSIS — M47816 Spondylosis without myelopathy or radiculopathy, lumbar region: Secondary | ICD-10-CM | POA: Diagnosis not present

## 2018-02-16 DIAGNOSIS — M545 Low back pain: Secondary | ICD-10-CM | POA: Diagnosis not present

## 2018-02-16 DIAGNOSIS — M5136 Other intervertebral disc degeneration, lumbar region: Secondary | ICD-10-CM | POA: Diagnosis not present

## 2018-02-16 DIAGNOSIS — M4155 Other secondary scoliosis, thoracolumbar region: Secondary | ICD-10-CM | POA: Diagnosis not present

## 2018-02-16 DIAGNOSIS — M546 Pain in thoracic spine: Secondary | ICD-10-CM | POA: Diagnosis not present

## 2018-02-18 ENCOUNTER — Other Ambulatory Visit: Payer: Self-pay | Admitting: Neurosurgery

## 2018-02-18 DIAGNOSIS — G8929 Other chronic pain: Secondary | ICD-10-CM

## 2018-02-18 DIAGNOSIS — M545 Low back pain: Principal | ICD-10-CM

## 2018-03-03 ENCOUNTER — Ambulatory Visit
Admission: RE | Admit: 2018-03-03 | Discharge: 2018-03-03 | Disposition: A | Discharge status: Home / Self Care | Payer: BLUE CROSS/BLUE SHIELD | Source: Ambulatory Visit | Attending: Neurosurgery | Admitting: Neurosurgery

## 2018-03-03 DIAGNOSIS — G8929 Other chronic pain: Secondary | ICD-10-CM

## 2018-03-03 DIAGNOSIS — M5125 Other intervertebral disc displacement, thoracolumbar region: Secondary | ICD-10-CM | POA: Diagnosis not present

## 2018-03-03 DIAGNOSIS — M545 Low back pain: Principal | ICD-10-CM

## 2018-03-03 MED ORDER — GADOBENATE DIMEGLUMINE 529 MG/ML IV SOLN
19.0000 mL | Freq: Once | INTRAVENOUS | Status: AC | PRN
  Administered 2018-03-03: 19 mL via INTRAVENOUS

## 2018-03-08 DIAGNOSIS — M47816 Spondylosis without myelopathy or radiculopathy, lumbar region: Secondary | ICD-10-CM | POA: Diagnosis not present

## 2018-03-08 DIAGNOSIS — M4155 Other secondary scoliosis, thoracolumbar region: Secondary | ICD-10-CM | POA: Diagnosis not present

## 2018-03-08 DIAGNOSIS — M48061 Spinal stenosis, lumbar region without neurogenic claudication: Secondary | ICD-10-CM | POA: Diagnosis not present

## 2018-03-08 DIAGNOSIS — M5136 Other intervertebral disc degeneration, lumbar region: Secondary | ICD-10-CM | POA: Diagnosis not present

## 2018-03-08 DIAGNOSIS — M545 Low back pain: Secondary | ICD-10-CM | POA: Diagnosis not present

## 2018-03-18 DIAGNOSIS — S30861A Insect bite (nonvenomous) of abdominal wall, initial encounter: Secondary | ICD-10-CM | POA: Diagnosis not present

## 2018-03-18 DIAGNOSIS — T07XXXA Unspecified multiple injuries, initial encounter: Secondary | ICD-10-CM | POA: Diagnosis not present

## 2018-04-01 DIAGNOSIS — M5136 Other intervertebral disc degeneration, lumbar region: Secondary | ICD-10-CM | POA: Diagnosis not present

## 2018-04-01 DIAGNOSIS — M5415 Radiculopathy, thoracolumbar region: Secondary | ICD-10-CM | POA: Diagnosis not present

## 2018-04-01 DIAGNOSIS — M48061 Spinal stenosis, lumbar region without neurogenic claudication: Secondary | ICD-10-CM | POA: Diagnosis not present

## 2018-04-01 DIAGNOSIS — M4726 Other spondylosis with radiculopathy, lumbar region: Secondary | ICD-10-CM | POA: Diagnosis not present

## 2018-04-19 DIAGNOSIS — M129 Arthropathy, unspecified: Secondary | ICD-10-CM | POA: Diagnosis not present

## 2018-04-19 DIAGNOSIS — I10 Essential (primary) hypertension: Secondary | ICD-10-CM | POA: Diagnosis not present

## 2018-04-19 DIAGNOSIS — E559 Vitamin D deficiency, unspecified: Secondary | ICD-10-CM | POA: Diagnosis not present

## 2018-04-19 DIAGNOSIS — E038 Other specified hypothyroidism: Secondary | ICD-10-CM | POA: Diagnosis not present

## 2018-04-19 DIAGNOSIS — D508 Other iron deficiency anemias: Secondary | ICD-10-CM | POA: Diagnosis not present

## 2018-05-11 DIAGNOSIS — M0609 Rheumatoid arthritis without rheumatoid factor, multiple sites: Secondary | ICD-10-CM | POA: Diagnosis not present

## 2018-05-11 DIAGNOSIS — R5382 Chronic fatigue, unspecified: Secondary | ICD-10-CM | POA: Diagnosis not present

## 2018-05-11 DIAGNOSIS — M25531 Pain in right wrist: Secondary | ICD-10-CM | POA: Diagnosis not present

## 2018-05-12 DIAGNOSIS — M0609 Rheumatoid arthritis without rheumatoid factor, multiple sites: Secondary | ICD-10-CM | POA: Diagnosis not present

## 2018-06-01 DIAGNOSIS — J019 Acute sinusitis, unspecified: Secondary | ICD-10-CM | POA: Diagnosis not present

## 2018-06-11 DIAGNOSIS — M4155 Other secondary scoliosis, thoracolumbar region: Secondary | ICD-10-CM | POA: Diagnosis not present

## 2018-06-11 DIAGNOSIS — L603 Nail dystrophy: Secondary | ICD-10-CM | POA: Diagnosis not present

## 2018-06-11 DIAGNOSIS — M48061 Spinal stenosis, lumbar region without neurogenic claudication: Secondary | ICD-10-CM | POA: Diagnosis not present

## 2018-06-11 DIAGNOSIS — G8929 Other chronic pain: Secondary | ICD-10-CM | POA: Diagnosis not present

## 2018-06-11 DIAGNOSIS — L814 Other melanin hyperpigmentation: Secondary | ICD-10-CM | POA: Diagnosis not present

## 2018-06-11 DIAGNOSIS — M545 Low back pain: Secondary | ICD-10-CM | POA: Diagnosis not present

## 2018-06-21 DIAGNOSIS — M0609 Rheumatoid arthritis without rheumatoid factor, multiple sites: Secondary | ICD-10-CM | POA: Diagnosis not present

## 2018-06-30 ENCOUNTER — Other Ambulatory Visit: Payer: Self-pay

## 2018-06-30 ENCOUNTER — Ambulatory Visit: Payer: BLUE CROSS/BLUE SHIELD | Attending: Neurosurgery

## 2018-06-30 DIAGNOSIS — M6281 Muscle weakness (generalized): Secondary | ICD-10-CM

## 2018-06-30 DIAGNOSIS — R252 Cramp and spasm: Secondary | ICD-10-CM | POA: Diagnosis not present

## 2018-06-30 DIAGNOSIS — M5441 Lumbago with sciatica, right side: Secondary | ICD-10-CM | POA: Insufficient documentation

## 2018-06-30 DIAGNOSIS — G8929 Other chronic pain: Secondary | ICD-10-CM | POA: Diagnosis not present

## 2018-06-30 NOTE — Therapy (Signed)
[facility] [address], [address] [place], [place], [address] Phone: [phone]   Fax:  [phone]  Physical Therapy Evaluation  Patient Details  Name: [name] MRN: [id number] Date of Birth: 1955/11/05 Referring Provider (PT): Shirlean , MD   Encounter Date: 06/30/2018  PT End of Session - 06/30/18 1611    Visit Number  1    Date for PT Re-Evaluation  08/25/18    Authorization Type  [employer]    PT Start Time  1525    PT Stop Time  1608    PT Time Calculation (min)  43 min    Activity Tolerance  Patient tolerated treatment well    Behavior During Therapy  [facility] for tasks assessed/performed       Past Medical History:  Diagnosis Date  . Anxiety   . Arthritis   . Complication of anesthesia    during surgery she could hear staff talking and could feel them preping her arm over 30 yrs. ago  . Fibromyalgia   . GERD (gastroesophageal reflux disease)   . Hypertension   . Hypothyroidism   . Restless leg syndrome   . Staph infection    2015  . Wears glasses     Past Surgical History:  Procedure Laterality Date  . ABDOMINAL HYSTERECTOMY  1981   partial  . APPLICATION OF WOUND VAC N/A 04/25/2014   Procedure: APPLICATION OF WOUND VAC;  Surgeon: [name], MD;  Location: [facility];  Service: Neurosurgery;  Laterality: N/A;  . BACK SURGERY  2007  . BLADDER SUSPENSION     2004  . CERVICAL FUSION  1990  . CERVICAL FUSION     2000  . CYST EXCISION Left 1985   wrist  . CYST EXCISION  1985   chest  . FOOT SURGERY Right 2009   reconstruction with hardware  . FOOT SURGERY Left 2013   reconstruction with hardware  . LUMBAR FUSION  03/2014  . LUMBAR WOUND DEBRIDEMENT N/A 04/25/2014   Procedure: LUMBAR WOUND DEBRIDEMENT Placement of Woundvac);  Surgeon: [name], MD;  Location: [facility];  Service: Neurosurgery;  Laterality: N/A;  . PICC LINE PLACE PERIPHERAL ([facility] HX)     2015-infusion  therapy secondary to STAPH infection   . TONSILLECTOMY    . TOTAL KNEE ARTHROPLASTY Bilateral 02/27/2015   Procedure: RIGHT TOTAL KNEE ARTHROPLASTY, LEFT KNEE CORTISONE INJECTION;  Surgeon: Durene Romans, MD;  Location: [facility];  Service: Orthopedics;  Laterality: Bilateral;  . TOTAL KNEE ARTHROPLASTY Left 06/19/2015   Procedure: LEFT TOTAL KNEE ARTHROPLASTY;  Surgeon: Durene Romans, MD;  Location: [facility];  Service: Orthopedics;  Laterality: Left;  . TUBAL LIGATION      There were no vitals filed for this visit.   Subjective Assessment - 06/30/18 1526    Subjective  Pt presents to PT with complaints of LBP and Rt buttock area of a chronic nature with increased pain in November 2019.  Pt had a drive to the beach and pain has been constant since then.     Pertinent History  L3-S1 fusion, stenosis L1-3, epidural 03/2018 without improvement    Limitations  Sitting    How long can you sit comfortably?  15 minutes     How long can you stand comfortably?  25-30 minutes    Diagnostic tests  x-ray and MRI of lumbar spine: L1-3 stenosis     Patient Stated Goals  reduce pain in Rt gluteals, sit  and stand longer, avoid sugery    Currently in Pain?  Yes    Pain Score  2    up to 10/10 with standing   Pain Location  Back    Pain Orientation  Right    Pain Descriptors / Indicators  Burning    Pain Type  Chronic pain    Pain Onset  More than a month ago    Pain Frequency  Constant    Aggravating Factors   sitting, standing, sleep at night    Pain Relieving Factors  change of position, nothing else helps     Effect of Pain on Daily Activities  sleep is disturbed, driving limited, sitting limited         [facility] PT Assessment - 06/30/18 0001      Assessment   Medical Diagnosis  chronic bilateral low back pain without sciatica    Referring Provider (PT)  Shirlean KellyNudelman, Robert, MD    Onset Date/Surgical Date  05/07/18   chronic with flare-up   Next MD Visit  2 months    Prior Therapy  2016       Precautions   Precautions  None      Restrictions   Weight Bearing Restrictions  No      Balance Screen   Has the patient fallen in the past 6 months  No    Has the patient had a decrease in activity level because of a fear of falling?   No    Is the patient reluctant to leave their home because of a fear of falling?   No      Home Environment   Living Environment  Private residence    Living Arrangements  Spouse/significant other    Type of Home  House    Home Access  Stairs to enter    Home Layout  Two level      Prior Function   Level of Independence  Independent    Vocation  Retired    Leisure  walking, some desk work for [employer] business      Cognition   Overall Cognitive Status  Within Capital OneFunctional Limits for tasks assessed      Observation/Other Assessments   Focus on Therapeutic Outcomes (FOTO)   52% limitation      Posture/Postural Control   Posture/Postural Control  Postural limitations    Postural Limitations  Flexed trunk;Forward head      ROM / Strength   AROM / PROM / Strength  AROM;Strength      AROM   Overall AROM   Within functional limits for tasks performed    Overall AROM Comments  Pt with full lumbar A/ROM with pain reported at end range.  No increase in Rt LE pain.  Repetative extension incresed Rt upper gluteal pain.        Strength   Overall Strength  Deficits    Overall Strength Comments  Bil hips 4/5, knees 4+/5, ankle DF 4/5 bil.        Palpation   Spinal mobility  reduced PA mobility in thoracic and lumbar spine with pain L1-5.    Palpation comment  tension and trigger points over Rt>Lt lumbar paraspinals and Rt gluteals      Special Tests    Special Tests  Lumbar    Lumbar Tests  Slump Test;Straight Leg Raise      Slump test   Findings  Negative    Side  Right      Straight  Leg Raise   Findings  Negative    Side   Right      Transfers   Transfers  Independent with all Transfers      Ambulation/Gait   Gait Pattern  Within  Functional Limits                Objective measurements completed on examination: See above findings.              PT Education - 06/30/18 1601    Education Details  Access Code: [email or web address]    Person(s) Educated  Patient    Methods  Explanation;Demonstration;Handout    Comprehension  Verbalized understanding;Returned demonstration       PT Short Term Goals - 06/30/18 1603      PT SHORT TERM GOAL #1   Title  be independent in initial HEP    Time  4    Period  Weeks    Status  New    Target Date  07/28/18      PT SHORT TERM GOAL #2   Title  report < or = to 6/10 LBP and Rt gluteal pain with standing for ADLs and self-care    Time  4    Period  Weeks    Status  New    Target Date  07/28/08      PT SHORT TERM GOAL #3   Title  reduce LBP to sit for 25 minutes to allow for increased distance with driving    Time  4    Period  Weeks    Status  New    Target Date  07/28/18        PT Long Term Goals - 06/30/18 1609      PT LONG TERM GOAL #1   Title  be independent in advanced HEP    Time  8    Period  Weeks    Status  New    Target Date  08/25/18      PT LONG TERM GOAL #2   Title  reduce FOTO to < or = to 40% limitation    Time  8    Period  Weeks    Status  New    Target Date  08/25/18      PT LONG TERM GOAL #3   Title  reduce LBP to sit for > or = to 30 minutes to improve tolerance for driving    Time  8    Period  Weeks    Status  New    Target Date  08/25/18      PT LONG TERM GOAL #4   Title  report < or = to 4/10 max Rt gluteal and low back pain with ADLs and self-care    Time  8    Period  Weeks    Status  New    Target Date  08/25/18      PT LONG TERM GOAL #5   Title  verbalize and understand body mechanics modifications and abdominal bracing with daily tasks for lumbar protection    Time  8    Period  Weeks    Status  New    Target Date  08/25/18      Additional Long Term Goals   Additional Long Term Goals  Yes      PT  LONG TERM GOAL #6   Title  report 50% fewer sleep disturbances due to LBP    Time  8  Period  Weeks    Target Date  08/25/18             Plan - 06/30/18 1615    Clinical Impression Statement  Pt presents to PT with LBP of a chronic nature.  Pt had flare up of LBP and onset of Rt gluteal pain in October 2019 after a drive to the beach.  Pt has history of fusion L3-S1 and x-rays over the past few years show progressive deformity L1-3 per MD notes.  Pt would like to try PT as surgical intervention would require advanced procedure.  Pt demonstrates flexed standing posture, weak abdominals in sitting and with transitional movements.  Pt with hip weakness bilaterally, reduced lumbar spinal mobility, tension and trigger points in bil lumbar paraspinals and Rt gluteals.  Pt is limited to sitting 15 minutes and standing > 25-30 minutes due to 10/10 pain with these activities.  Pt will benefit from skilled PT for body mechanics education, core and hip strength, flexibility and manual/modalities for pain management.      History and Personal Factors relevant to plan of care:  lumbar fusion 2015    Clinical Presentation  Stable    Clinical Decision Making  Low    Rehab Potential  Good    PT Frequency  1x / week    PT Duration  8 weeks    PT Treatment/Interventions  ADLs/Self Care Home Management;Cryotherapy;Moist Heat;Ultrasound;Traction;Therapeutic exercise;Neuromuscular re-education;Patient/family education;Manual techniques;Passive range of motion;Dry needling;Taping    PT Next Visit Plan  build HEP as pt is coming 1x/wk due to high deductible, core strength, review HEP, hip strength, lumbar traction (?)    PT Home Exercise Plan  Access Code: [email or web address]    Consulted and Agree with Plan of Care  Patient       Patient will benefit from skilled therapeutic intervention in order to improve the following deficits and impairments:  Impaired flexibility, Decreased activity tolerance, Decreased range of  motion, Decreased strength, Postural dysfunction, Increased muscle spasms, Improper body mechanics, Pain  Visit Diagnosis: Chronic bilateral low back pain with right-sided sciatica - Plan: PT plan of care cert/re-cert  Cramp and spasm - Plan: PT plan of care cert/re-cert  Muscle weakness (generalized) - Plan: PT plan of care cert/re-cert     Problem List Patient Active Problem List   Diagnosis Date Noted  . S/P left TKA 06/19/2015  . Morbid obesity (HCC) 02/28/2015  . S/P right TKA 02/27/2015  . S/P knee replacement 02/27/2015  . Lumbar surgical wound fluid collection   . Wound drainage   . Staphylococcus aureus infection   . Screen for STD (sexually transmitted disease)   . Wound infection after surgery 04/24/2014  . Spinal stenosis of lumbar region with neurogenic claudication 03/30/2014    Lorrene Reid, PT 06/30/18 4:31 PM  [facility] [address] [place], [place], [address] Phone: [phone]   Fax:  [phone]  Name: [name] MRN: [id number] Date of Birth: 12-16-1955

## 2018-06-30 NOTE — Patient Instructions (Signed)
Access Code: [email or web address]  URL: [email or web address]  Date: 06/30/2018  Prepared by: Lorrene Reid   Exercises Supine Lower Trunk Rotation - 3 reps - 1 sets - 20 hold - 3x daily - 7x weekly Hooklying Single Knee to Chest - 3 reps - 1 sets - 20 hold - 3x daily - 7x weekly Seated Hamstring Stretch - 3 reps - 1 sets - 20 hold - 3x daily - 7x weekly Seated Transversus Abdominis Bracing - 10 reps - 3 sets - 1x daily - 7x weekly

## 2018-07-07 ENCOUNTER — Ambulatory Visit: Payer: BLUE CROSS/BLUE SHIELD

## 2018-07-07 DIAGNOSIS — M6281 Muscle weakness (generalized): Secondary | ICD-10-CM | POA: Diagnosis not present

## 2018-07-07 DIAGNOSIS — M5441 Lumbago with sciatica, right side: Secondary | ICD-10-CM | POA: Diagnosis not present

## 2018-07-07 DIAGNOSIS — R252 Cramp and spasm: Secondary | ICD-10-CM

## 2018-07-07 DIAGNOSIS — G8929 Other chronic pain: Secondary | ICD-10-CM | POA: Diagnosis not present

## 2018-07-07 NOTE — Therapy (Signed)
[facility] [address], [address] [place], [place], [address] Phone: [phone]   Fax:  [phone]  Physical Therapy Treatment  Patient Details  Name: [name] MRN: [id number] Date of Birth: 07/11/1955 Referring Provider (PT): Shirlean KellyNudelman, Robert, MD   Encounter Date: 07/07/2018  PT End of Session - 07/07/18 1442    Visit Number  2    Date for PT Re-Evaluation  08/25/18    Authorization Type  BCBS    PT Start Time  1401    PT Stop Time  1457    PT Time Calculation (min)  56 min    Activity Tolerance  Patient tolerated treatment well    Behavior During Therapy  [facility] for [facility] assessed/performed       Past Medical History:  Diagnosis Date  . Anxiety   . Arthritis   . Complication of anesthesia    during surgery she could hear staff talking and could feel them preping her arm over 30 yrs. ago  . Fibromyalgia   . GERD (gastroesophageal reflux disease)   . Hypertension   . Hypothyroidism   . Restless leg syndrome   . Staph infection    2015  . Wears glasses     Past Surgical History:  Procedure Laterality Date  . ABDOMINAL HYSTERECTOMY  1981   partial  . APPLICATION OF WOUND VAC N/A 04/25/2014   Procedure: APPLICATION OF WOUND VAC;  Surgeon: Hewitt Shortsobert W Nudelman, MD;  Location: [facility];  Service: Neurosurgery;  Laterality: N/A;  . BACK SURGERY  2007  . BLADDER SUSPENSION     2004  . CERVICAL FUSION  1990  . CERVICAL FUSION     2000  . CYST EXCISION Left 1985   wrist  . CYST EXCISION  1985   chest  . FOOT SURGERY Right 2009   reconstruction with hardware  . FOOT SURGERY Left 2013   reconstruction with hardware  . LUMBAR FUSION  03/2014  . LUMBAR WOUND DEBRIDEMENT N/A 04/25/2014   Procedure: LUMBAR WOUND DEBRIDEMENT Placement of Woundvac);  Surgeon: Hewitt Shortsobert W Nudelman, MD;  Location: [facility];  Service: Neurosurgery;  Laterality: N/A;  . PICC LINE PLACE PERIPHERAL ([facility] HX)     2015-infusion  therapy secondary to STAPH infection   . TONSILLECTOMY    . TOTAL KNEE ARTHROPLASTY Bilateral 02/27/2015   Procedure: RIGHT TOTAL KNEE ARTHROPLASTY, LEFT KNEE CORTISONE INJECTION;  Surgeon: Durene RomansMatthew Olin, MD;  Location: [facility];  Service: Orthopedics;  Laterality: Bilateral;  . TOTAL KNEE ARTHROPLASTY Left 06/19/2015   Procedure: LEFT TOTAL KNEE ARTHROPLASTY;  Surgeon: Durene RomansMatthew Olin, MD;  Location: [facility];  Service: Orthopedics;  Laterality: Left;  . TUBAL LIGATION      There were no vitals filed for this visit.  Subjective Assessment - 07/07/18 1401    Subjective  I have stirred up some of my pain with the trunk rotation.      Pertinent History  L3-S1 fusion, stenosis L1-3, epidural 03/2018 without improvement    Patient Stated Goals  reduce pain in Rt gluteals, sit and stand longer, avoid sugery    Currently in Pain?  Yes    Pain Score  5     Pain Location  Back    Pain Orientation  Right    Pain Descriptors / Indicators  Burning    Pain Type  Chronic pain    Pain Onset  More than a month ago    Aggravating Factors   driving/sitting,  standing, sleep at night    Pain Relieving Factors  change of position, supine or sitting.                       [facility] Adult PT Treatment/Exercise - 07/07/18 0001      Exercises   Exercises  Knee/Hip;Lumbar      Lumbar Exercises: Stretches   Active Hamstring Stretch  3 reps;20 seconds    Single Knee to Chest Stretch  Left;Right;3 reps;20 seconds    Lower Trunk Rotation  3 reps;20 seconds      Lumbar Exercises: Supine   Ab Set  5 seconds;15 reps    AB Set Limitations  ball squeeze with abdominal bracing 2x10    Other Supine Lumbar Exercises  horizontal abduction with abdominal bracing red band 2x10      Lumbar Exercises: Sidelying   Clam  Both;20 reps;5 seconds      Modalities   Modalities  Moist Heat;Electrical Stimulation      Moist Heat Therapy   Number Minutes Moist Heat  15 Minutes    Moist Heat Location  Hip   Rt  gluteals and lumbar paraspinals     Electrical Stimulation   Electrical Stimulation Location  Rt gluteals and lumbar spine    Electrical Stimulation Action  IFC    Electrical Stimulation Parameters  15 minutes     Electrical Stimulation Goals  Pain      Manual Therapy   Manual Therapy  Soft tissue mobilization;Myofascial release    Manual therapy comments  orange roller over Rt gluteals and black firm ball for trigger point release    Myofascial Release  Pt could not tolerate Addaday             PT Education - 07/07/18 1426    Education Details  Access Code: [email or web address]    Person(s) Educated  Patient    Methods  Explanation;Demonstration;Handout    Comprehension  Returned demonstration;Verbalized understanding       PT Short Term Goals - 06/30/18 1603      PT SHORT TERM GOAL #1   Title  be independent in initial HEP    Time  4    Period  Weeks    Status  New    Target Date  07/28/18      PT SHORT TERM GOAL #2   Title  report < or = to 6/10 LBP and Rt gluteal pain with standing for ADLs and self-care    Time  4    Period  Weeks    Status  New    Target Date  07/28/08      PT SHORT TERM GOAL #3   Title  reduce LBP to sit for 25 minutes to allow for increased distance with driving    Time  4    Period  Weeks    Status  New    Target Date  07/28/18        PT Long Term Goals - 06/30/18 1609      PT LONG TERM GOAL #1   Title  be independent in advanced HEP    Time  8    Period  Weeks    Status  New    Target Date  08/25/18      PT LONG TERM GOAL #2   Title  reduce FOTO to < or = to 40% limitation    Time  8    Period  Weeks  Status  New    Target Date  08/25/18      PT LONG TERM GOAL #3   Title  reduce LBP to sit for > or = to 30 minutes to improve tolerance for driving    Time  8    Period  Weeks    Status  New    Target Date  08/25/18      PT LONG TERM GOAL #4   Title  report < or = to 4/10 max Rt gluteal and low back pain with ADLs and  self-care    Time  8    Period  Weeks    Status  New    Target Date  08/25/18      PT LONG TERM GOAL #5   Title  verbalize and understand body mechanics modifications and abdominal bracing with daily tasks for lumbar protection    Time  8    Period  Weeks    Status  New    Target Date  08/25/18      Additional Long Term Goals   Additional Long Term Goals  Yes      PT LONG TERM GOAL #6   Title  report 50% fewer sleep disturbances due to LBP    Time  8    Period  Weeks    Target Date  08/25/18            Plan - 07/07/18 1412    Clinical Impression Statement  Pt with first time follow-up after evaluation.  Pt reports that she has had some increased pain since evaluation and thinks it might be the exercises.  PT educated pt to perform all stretching exercises gently.  Pt declined dry needling.  Pt with tension and trigger points in Rt gluteals with manual therapy.  PT added gentle hip strengthening additions to HEP without increased pain.  Pt will continue to benefit from skilled PT for advancement of HEP for flexibility and strength.      Rehab Potential  Good    PT Frequency  1x / week    PT Duration  8 weeks    PT Treatment/Interventions  ADLs/Self Care Home Management;Cryotherapy;Moist Heat;Ultrasound;Traction;Therapeutic exercise;Neuromuscular re-education;Patient/family education;Manual techniques;Passive range of motion;Dry needling;Taping    PT Next Visit Plan  core strength, manual to Rt gluteals, hip flexibility    PT Home Exercise Plan  Access Code: [email or web address]    Consulted and Agree with Plan of Care  Patient       Patient will benefit from skilled therapeutic intervention in order to improve the following deficits and impairments:  Impaired flexibility, Decreased activity tolerance, Decreased range of motion, Decreased strength, Postural dysfunction, Increased muscle spasms, Improper body mechanics, Pain  Visit Diagnosis: Chronic bilateral low back pain with  right-sided sciatica  Cramp and spasm  Muscle weakness (generalized)     Problem List Patient Active Problem List   Diagnosis Date Noted  . S/P left TKA 06/19/2015  . Morbid obesity (HCC) 02/28/2015  . S/P right TKA 02/27/2015  . S/P knee replacement 02/27/2015  . Lumbar surgical wound fluid collection   . Wound drainage   . Staphylococcus aureus infection   . Screen for STD (sexually transmitted disease)   . Wound infection after surgery 04/24/2014  . Spinal stenosis of lumbar region with neurogenic claudication 03/30/2014    [name], PT 07/07/18 2:44 PM  [facility] [address], [address] [place], [place], [address] Phone: [phone]  Fax:  [phone]  Name: [name] MRN: [id number] Date of Birth: September 26, 1955

## 2018-07-07 NOTE — Patient Instructions (Signed)
Access Code: [email or web address]  URL: [email or web address]  Date: 07/07/2018  Prepared by: Lorrene ReidKelly Takacs   Exercises  Clamshell - 10 reps - 2 sets - 2x daily - 7x weekly Supine Hip Adduction Isometric with Ball - 10 reps - 2 sets - 5 hold - 1-2x daily - 7x weekly Supine Shoulder Horizontal Abduction with Resistance - 10 reps - 2 sets - 1x daily - 7x weekly

## 2018-07-14 ENCOUNTER — Ambulatory Visit: Payer: BLUE CROSS/BLUE SHIELD

## 2018-07-14 DIAGNOSIS — M5441 Lumbago with sciatica, right side: Secondary | ICD-10-CM | POA: Diagnosis not present

## 2018-07-14 DIAGNOSIS — R252 Cramp and spasm: Secondary | ICD-10-CM

## 2018-07-14 DIAGNOSIS — M6281 Muscle weakness (generalized): Secondary | ICD-10-CM

## 2018-07-14 DIAGNOSIS — G8929 Other chronic pain: Secondary | ICD-10-CM

## 2018-07-14 NOTE — Therapy (Signed)
[facility] [address] [place], [place], [address] Phone: [phone]   Fax:  [phone]  Physical Therapy Treatment  Patient Details  Name: [name] MRN: [id number] Date of Birth: Aug 08, 1955 Referring Provider (PT): Shirlean , MD   Encounter Date: 07/14/2018  PT End of Session - 07/14/18 1451    Visit Number  3    Date for PT Re-Evaluation  08/25/18    Authorization Type  BCBS    PT Start Time  1413    PT Stop Time  1501    PT Time Calculation (min)  48 min    Activity Tolerance  Patient tolerated treatment well    Behavior During Therapy  [facility] for tasks assessed/performed       Past Medical History:  Diagnosis Date  . Anxiety   . Arthritis   . Complication of anesthesia    during surgery she could hear staff talking and could feel them preping her arm over 30 yrs. ago  . Fibromyalgia   . GERD (gastroesophageal reflux disease)   . Hypertension   . Hypothyroidism   . Restless leg syndrome   . Staph infection    2015  . Wears glasses     Past Surgical History:  Procedure Laterality Date  . ABDOMINAL HYSTERECTOMY  1981   partial  . APPLICATION OF WOUND VAC N/A 04/25/2014   Procedure: APPLICATION OF WOUND VAC;  Surgeon: [name], MD;  Location: [facility];  Service: Neurosurgery;  Laterality: N/A;  . BACK SURGERY  2007  . BLADDER SUSPENSION     2004  . CERVICAL FUSION  1990  . CERVICAL FUSION     2000  . CYST EXCISION Left 1985   wrist  . CYST EXCISION  1985   chest  . FOOT SURGERY Right 2009   reconstruction with hardware  . FOOT SURGERY Left 2013   reconstruction with hardware  . LUMBAR FUSION  03/2014  . LUMBAR WOUND DEBRIDEMENT N/A 04/25/2014   Procedure: LUMBAR WOUND DEBRIDEMENT Placement of Woundvac);  Surgeon: [name], MD;  Location: [facility];  Service: Neurosurgery;  Laterality: N/A;  . PICC LINE PLACE PERIPHERAL ([facility] HX)     2015-infusion  therapy secondary to STAPH infection   . TONSILLECTOMY    . TOTAL KNEE ARTHROPLASTY Bilateral 02/27/2015   Procedure: RIGHT TOTAL KNEE ARTHROPLASTY, LEFT KNEE CORTISONE INJECTION;  Surgeon: Durene Romans, MD;  Location: [facility];  Service: Orthopedics;  Laterality: Bilateral;  . TOTAL KNEE ARTHROPLASTY Left 06/19/2015   Procedure: LEFT TOTAL KNEE ARTHROPLASTY;  Surgeon: Durene Romans, MD;  Location: [facility];  Service: Orthopedics;  Laterality: Left;  . TUBAL LIGATION      There were no vitals filed for this visit.  Subjective Assessment - 07/14/18 1412    Subjective  I have been doing my exercises.      Patient Stated Goals  reduce pain in Rt gluteals, sit and stand longer, avoid sugery    Currently in Pain?  Yes    Pain Score  5     Pain Location  Back    Pain Orientation  Right    Pain Descriptors / Indicators  Burning    Pain Type  Chronic pain    Pain Onset  More than a month ago    Pain Frequency  Constant    Aggravating Factors   driving/sitting, standing for 20 minutes    Pain Relieving Factors  change of  position, supine or sitting                       [facility] Adult PT Treatment/Exercise - 07/14/18 0001      Lumbar Exercises: Stretches   Active Hamstring Stretch  3 reps;20 seconds    Single Knee to Chest Stretch  Left;Right;3 reps;20 seconds    Lower Trunk Rotation  3 reps;20 seconds      Lumbar Exercises: Aerobic   Nustep  Level 2x 6 minutes      Lumbar Exercises: Supine   Ab Set  5 seconds;15 reps    AB Set Limitations  ball squeeze with abdominal bracing 2x10    Bent Knee Raise  20 reps    Bent Knee Raise Limitations  ab bracing with marching 5 x 6 marches     Other Supine Lumbar Exercises  horizontal abduction with abdominal bracing red band 2x10      Lumbar Exercises: Sidelying   Clam  Both;20 reps;5 seconds      Modalities   Modalities  Moist Heat;Electrical Stimulation      Moist Heat Therapy   Number Minutes Moist Heat  15 Minutes    Moist  Heat Location  Hip   Rt gluteals and paraspinals     Electrical Stimulation   Electrical Stimulation Location  Rt gluteals and lumbar spine    Electrical Stimulation Action  IFC    Electrical Stimulation Parameters  15 minutes    Electrical Stimulation Goals  Pain             PT Education - 07/14/18 1447    Education Details  Access Code: [email or web address]    Person(s) Educated  Patient    Methods  Explanation;Demonstration;Handout    Comprehension  Verbalized understanding;Returned demonstration       PT Short Term Goals - 07/14/18 1413      PT SHORT TERM GOAL #1   Title  be independent in initial HEP    Status  Achieved        PT Long Term Goals - 06/30/18 1609      PT LONG TERM GOAL #1   Title  be independent in advanced HEP    Time  8    Period  Weeks    Status  New    Target Date  08/25/18      PT LONG TERM GOAL #2   Title  reduce FOTO to < or = to 40% limitation    Time  8    Period  Weeks    Status  New    Target Date  08/25/18      PT LONG TERM GOAL #3   Title  reduce LBP to sit for > or = to 30 minutes to improve tolerance for driving    Time  8    Period  Weeks    Status  New    Target Date  08/25/18      PT LONG TERM GOAL #4   Title  report < or = to 4/10 max Rt gluteal and low back pain with ADLs and self-care    Time  8    Period  Weeks    Status  New    Target Date  08/25/18      PT LONG TERM GOAL #5   Title  verbalize and understand body mechanics modifications and abdominal bracing with daily tasks for lumbar protection    Time  8  Period  Weeks    Status  New    Target Date  08/25/18      Additional Long Term Goals   Additional Long Term Goals  Yes      PT LONG TERM GOAL #6   Title  report 50% fewer sleep disturbances due to LBP    Time  8    Period  Weeks    Target Date  08/25/18            Plan - 07/14/18 1424    Clinical Impression Statement  Pt is coming 1x/wk only for exercise advancement.  Pt denies any  significant change in pain since the start of care.  Pt had modified core exercise with ball squeeze to minimal to moderate squeeze on ball as she was having Rt gluteal pain.  Pt requires tactile and demo cues for scapular retraction with sitting.  Pt was able to demonstrate gentle core and hip strength exercises issued last session with good technique. PT added advancement for core exercise in supine and pt tolerated well.  Pt will continue to benefit from skilled PT for core strength, flexibility, manual and modalities as needed for pain.      PT Frequency  1x / week    PT Duration  8 weeks    PT Treatment/Interventions  ADLs/Self Care Home Management;Cryotherapy;Moist Heat;Ultrasound;Traction;Therapeutic exercise;Neuromuscular re-education;Patient/family education;Manual techniques;Passive range of motion;Dry needling;Taping    PT Next Visit Plan  core strength, manual to Rt gluteals, hip flexibility    PT Home Exercise Plan  Access Code: [email or web address]    Recommended Other Services  initial certification    Consulted and Agree with Plan of Care  Patient       Patient will benefit from skilled therapeutic intervention in order to improve the following deficits and impairments:  Impaired flexibility, Decreased activity tolerance, Decreased range of motion, Decreased strength, Postural dysfunction, Increased muscle spasms, Improper body mechanics, Pain  Visit Diagnosis: Chronic bilateral low back pain with right-sided sciatica  Cramp and spasm  Muscle weakness (generalized)     Problem List Patient Active Problem List   Diagnosis Date Noted  . S/P left TKA 06/19/2015  . Morbid obesity (HCC) 02/28/2015  . S/P right TKA 02/27/2015  . S/P knee replacement 02/27/2015  . Lumbar surgical wound fluid collection   . Wound drainage   . Staphylococcus aureus infection   . Screen for STD (sexually transmitted disease)   . Wound infection after surgery 04/24/2014  . Spinal stenosis of lumbar region  with neurogenic claudication 03/30/2014   Lorrene ReidKelly , PT 07/14/18 2:54 PM  [facility] [address], [address] [place], [place], [address] Phone: [phone]   Fax:  [phone]  Name: Malvin JohnsRebecca W Nickson MRN: [id number] Date of Birth: 11/25/1955

## 2018-07-14 NOTE — Patient Instructions (Signed)
Access Code: [email or web address]  URL: [email or web address]  Date: 07/14/2018  Prepared by: Lorrene Reid   Exercises   Supine March - 6 reps - 5 sets - 1x daily - 7x weekly

## 2018-07-21 ENCOUNTER — Ambulatory Visit: Payer: BLUE CROSS/BLUE SHIELD

## 2018-07-21 DIAGNOSIS — G8929 Other chronic pain: Secondary | ICD-10-CM

## 2018-07-21 DIAGNOSIS — R252 Cramp and spasm: Secondary | ICD-10-CM | POA: Diagnosis not present

## 2018-07-21 DIAGNOSIS — M5441 Lumbago with sciatica, right side: Secondary | ICD-10-CM | POA: Diagnosis not present

## 2018-07-21 DIAGNOSIS — M6281 Muscle weakness (generalized): Secondary | ICD-10-CM

## 2018-07-21 NOTE — Therapy (Signed)
[facility] [address] [place], [place], [address] Phone: [phone]   Fax:  [phone]  Physical Therapy Treatment  Patient Details  Name: [name] MRN: [id number] Date of Birth: 12/23/1955 Referring Provider (PT): Shirlean , MD   Encounter Date: 07/21/2018  PT End of Session - 07/21/18 1440    Visit Number  4    Date for PT Re-Evaluation  08/25/18    Authorization Type  [employer]    PT Start Time  1404    PT Stop Time  1459    PT Time Calculation (min)  55 min    Activity Tolerance  Patient tolerated treatment well    Behavior During Therapy  [facility] for tasks assessed/performed       Past Medical History:  Diagnosis Date  . Anxiety   . Arthritis   . Complication of anesthesia    during surgery she could hear staff talking and could feel them preping her arm over 30 yrs. ago  . Fibromyalgia   . GERD (gastroesophageal reflux disease)   . Hypertension   . Hypothyroidism   . Restless leg syndrome   . Staph infection    2015  . Wears glasses     Past Surgical History:  Procedure Laterality Date  . ABDOMINAL HYSTERECTOMY  1981   partial  . APPLICATION OF WOUND VAC N/A 04/25/2014   Procedure: APPLICATION OF WOUND VAC;  Surgeon: [name], MD;  Location: [facility];  Service: Neurosurgery;  Laterality: N/A;  . BACK SURGERY  2007  . BLADDER SUSPENSION     2004  . CERVICAL FUSION  1990  . CERVICAL FUSION     2000  . CYST EXCISION Left 1985   wrist  . CYST EXCISION  1985   chest  . FOOT SURGERY Right 2009   reconstruction with hardware  . FOOT SURGERY Left 2013   reconstruction with hardware  . LUMBAR FUSION  03/2014  . LUMBAR WOUND DEBRIDEMENT N/A 04/25/2014   Procedure: LUMBAR WOUND DEBRIDEMENT Placement of Woundvac);  Surgeon: [name], MD;  Location: [facility];  Service: Neurosurgery;  Laterality: N/A;  . PICC LINE PLACE PERIPHERAL ([facility] HX)     2015-infusion  therapy secondary to STAPH infection   . TONSILLECTOMY    . TOTAL KNEE ARTHROPLASTY Bilateral 02/27/2015   Procedure: RIGHT TOTAL KNEE ARTHROPLASTY, LEFT KNEE CORTISONE INJECTION;  Surgeon: Durene Romans, MD;  Location: [facility];  Service: Orthopedics;  Laterality: Bilateral;  . TOTAL KNEE ARTHROPLASTY Left 06/19/2015   Procedure: LEFT TOTAL KNEE ARTHROPLASTY;  Surgeon: Durene Romans, MD;  Location: [facility];  Service: Orthopedics;  Laterality: Left;  . TUBAL LIGATION      There were no vitals filed for this visit.  Subjective Assessment - 07/21/18 1409    Subjective  I sat for a long time at a funeral so I have stirred up my pain.  I was able to sit in church the entire service on Sunday and didn't notice my hip.      Pertinent History  L3-S1 fusion, stenosis L1-3, epidural 03/2018 without improvement    Currently in Pain?  Yes    Pain Score  6     Pain Location  Back    Pain Orientation  Right    Pain Descriptors / Indicators  Burning    Pain Type  Chronic pain    Pain Onset  More than a month ago    Pain  Frequency  Constant    Aggravating Factors   driving/sitting, standing long periods.    Pain Relieving Factors  change of position, stretching                       [facility] Adult PT Treatment/Exercise - 07/21/18 0001      Lumbar Exercises: Stretches   Active Hamstring Stretch  3 reps;20 seconds    Lower Trunk Rotation  3 reps;20 seconds   to the Lt only with red ball under legs   Piriformis Stretch  Left;Right;3 reps;20 seconds   seated and supine diagonal knee to chest     Lumbar Exercises: Aerobic   Nustep  Level 2x 8 minutes   PT present to discuss progress     Lumbar Exercises: Supine   Large Ball Oblique Isometric Limitations  ball rolls with red ball under legs 2x10      Lumbar Exercises: Sidelying   Clam  Both;20 reps;5 seconds      Modalities   Modalities  Moist Heat;Electrical Stimulation      Moist Heat Therapy   Number Minutes Moist Heat  15  Minutes    Moist Heat Location  Hip      Electrical Stimulation   Electrical Stimulation Location  Rt gluteals and lumbar spine    Electrical Stimulation Action  IFC    Electrical Stimulation Parameters  15 minutes    Electrical Stimulation Goals  Pain             PT Education - 07/21/18 1425    Education Details  Access Code: [email or web address]    Person(s) Educated  Patient    Methods  Explanation;Demonstration;Handout    Comprehension  Verbalized understanding;Returned demonstration       PT Short Term Goals - 07/21/18 1411      PT SHORT TERM GOAL #1   Title  be independent in initial HEP    Status  Achieved      PT SHORT TERM GOAL #2   Title  report < or = to 6/10 LBP and Rt gluteal pain with standing for ADLs and self-care    Baseline  4-5/10    Status  Achieved      PT SHORT TERM GOAL #3   Title  reduce LBP to sit for 25 minutes to allow for increased distance with driving    Status  Achieved        PT Long Term Goals - 06/30/18 1609      PT LONG TERM GOAL #1   Title  be independent in advanced HEP    Time  8    Period  Weeks    Status  New    Target Date  08/25/18      PT LONG TERM GOAL #2   Title  reduce FOTO to < or = to 40% limitation    Time  8    Period  Weeks    Status  New    Target Date  08/25/18      PT LONG TERM GOAL #3   Title  reduce LBP to sit for > or = to 30 minutes to improve tolerance for driving    Time  8    Period  Weeks    Status  New    Target Date  08/25/18      PT LONG TERM GOAL #4   Title  report < or = to 4/10 max Rt gluteal and low back  pain with ADLs and self-care    Time  8    Period  Weeks    Status  New    Target Date  08/25/18      PT LONG TERM GOAL #5   Title  verbalize and understand body mechanics modifications and abdominal bracing with daily tasks for lumbar protection    Time  8    Period  Weeks    Status  New    Target Date  08/25/18      Additional Long Term Goals   Additional Long Term Goals  Yes       PT LONG TERM GOAL #6   Title  report 50% fewer sleep disturbances due to LBP    Time  8    Period  Weeks    Target Date  08/25/18            Plan - 07/21/18 1418    Clinical Impression Statement  Pt with decreased pain over the past week.  Pt was able to improve sitting with driving to >10 minutes and able to sit in church for 1 hour without increased pain.  Pt with increased Rt hip pain to 6/10 after sitting a long period today at a funeral.  PT added additional stretches to HEP and pt was able to demonstrate all aspects of HEP correctly today without pain.  Pt had pain relief with electrical stimulation post session today.  Pt will continue to benefit from skilled PT for Rt gluteal strength and flexibility and pain management as needed.      Rehab Potential  Good    PT Frequency  1x / week    PT Duration  8 weeks    PT Treatment/Interventions  ADLs/Self Care Home Management;Cryotherapy;Moist Heat;Ultrasound;Traction;Therapeutic exercise;Neuromuscular re-education;Patient/family education;Manual techniques;Passive range of motion;Dry needling;Taping    PT Next Visit Plan  core strength, manual to Rt gluteals, hip flexibility.  Pt coming 1x/wk for HE p advancement.      PT Home Exercise Plan  Access Code: [email or web address]    Consulted and Agree with Plan of Care  Patient       Patient will benefit from skilled therapeutic intervention in order to improve the following deficits and impairments:  Impaired flexibility, Decreased activity tolerance, Decreased range of motion, Decreased strength, Postural dysfunction, Increased muscle spasms, Improper body mechanics, Pain  Visit Diagnosis: Chronic bilateral low back pain with right-sided sciatica  Cramp and spasm  Muscle weakness (generalized)     Problem List Patient Active Problem List   Diagnosis Date Noted  . S/P left TKA 06/19/2015  . Morbid obesity (HCC) 02/28/2015  . S/P right TKA 02/27/2015  . S/P knee replacement  02/27/2015  . Lumbar surgical wound fluid collection   . Wound drainage   . Staphylococcus aureus infection   . Screen for STD (sexually transmitted disease)   . Wound infection after surgery 04/24/2014  . Spinal stenosis of lumbar region with neurogenic claudication 03/30/2014    Lorrene Reid, PT 07/21/18 2:44 PM  [facility] [address], [address] [place], [place], [address] Phone: [phone]   Fax:  [phone]  Name: [name] MRN: [id number] Date of Birth: 1956-03-08

## 2018-07-21 NOTE — Patient Instructions (Signed)
Access Code: [email or web address]  URL: [email or web address]  Date: 07/21/2018  Prepared by: Lorrene Reid   Exercises  Supine Piriformis Stretch - 3 reps - 1 sets - 20 hold - 2x daily - 7x weekly Seated Figure 4 Piriformis Stretch - 3 reps - 1 sets - 20 hold - 2x daily - 7x weekly

## 2018-07-27 ENCOUNTER — Ambulatory Visit: Payer: BLUE CROSS/BLUE SHIELD | Attending: Neurosurgery

## 2018-07-27 DIAGNOSIS — M6281 Muscle weakness (generalized): Secondary | ICD-10-CM | POA: Diagnosis not present

## 2018-07-27 DIAGNOSIS — R252 Cramp and spasm: Secondary | ICD-10-CM | POA: Diagnosis not present

## 2018-07-27 DIAGNOSIS — G8929 Other chronic pain: Secondary | ICD-10-CM

## 2018-07-27 DIAGNOSIS — M5441 Lumbago with sciatica, right side: Secondary | ICD-10-CM | POA: Diagnosis not present

## 2018-07-27 NOTE — Therapy (Signed)
[facility] [address] [place], [place], [address] Phone: [phone]   Fax:  [phone]  Physical Therapy Treatment  Patient Details  Name: [name] MRN: [id number] Date of Birth: 08/29/1955 Referring Provider (PT): Shirlean KellyNudelman, Robert, MD   Encounter Date: 07/27/2018  PT End of Session - 07/27/18 1059    Visit Number  5    Date for PT Re-Evaluation  08/25/18    Authorization Type  BCBS    PT Start Time  1017    PT Stop Time  1113    PT Time Calculation (min)  56 min    Activity Tolerance  Patient tolerated treatment well    Behavior During Therapy  [facility] for tasks assessed/performed       Past Medical History:  Diagnosis Date  . Anxiety   . Arthritis   . Complication of anesthesia    during surgery she could hear staff talking and could feel them preping her arm over 30 yrs. ago  . Fibromyalgia   . GERD (gastroesophageal reflux disease)   . Hypertension   . Hypothyroidism   . Restless leg syndrome   . Staph infection    2015  . Wears glasses     Past Surgical History:  Procedure Laterality Date  . ABDOMINAL HYSTERECTOMY  1981   partial  . APPLICATION OF WOUND VAC N/A 04/25/2014   Procedure: APPLICATION OF WOUND VAC;  Surgeon: Hewitt Shortsobert W Nudelman, MD;  Location: [facility];  Service: Neurosurgery;  Laterality: N/A;  . BACK SURGERY  2007  . BLADDER SUSPENSION     2004  . CERVICAL FUSION  1990  . CERVICAL FUSION     2000  . CYST EXCISION Left 1985   wrist  . CYST EXCISION  1985   chest  . FOOT SURGERY Right 2009   reconstruction with hardware  . FOOT SURGERY Left 2013   reconstruction with hardware  . LUMBAR FUSION  03/2014  . LUMBAR WOUND DEBRIDEMENT N/A 04/25/2014   Procedure: LUMBAR WOUND DEBRIDEMENT Placement of Woundvac);  Surgeon: Hewitt Shortsobert W Nudelman, MD;  Location: [facility];  Service: Neurosurgery;  Laterality: N/A;  . PICC LINE PLACE PERIPHERAL ([facility] HX)     2015-infusion therapy  secondary to STAPH infection   . TONSILLECTOMY    . TOTAL KNEE ARTHROPLASTY Bilateral 02/27/2015   Procedure: RIGHT TOTAL KNEE ARTHROPLASTY, LEFT KNEE CORTISONE INJECTION;  Surgeon: Durene RomansMatthew Olin, MD;  Location: [facility];  Service: Orthopedics;  Laterality: Bilateral;  . TOTAL KNEE ARTHROPLASTY Left 06/19/2015   Procedure: LEFT TOTAL KNEE ARTHROPLASTY;  Surgeon: Durene RomansMatthew Olin, MD;  Location: [facility];  Service: Orthopedics;  Laterality: Left;  . TUBAL LIGATION      There were no vitals filed for this visit.  Subjective Assessment - 07/27/18 1022    Subjective  I am feeling good. I have used my TENs unit a couple of times.  Sitting and driving is better and moving my Rt leg in the shower is easier.  50% overall improvement.    Pertinent History  L3-S1 fusion, stenosis L1-3, epidural 03/2018 without improvement    Diagnostic tests  x-ray and MRI of lumbar spine: L1-3 stenosis     Patient Stated Goals  reduce pain in Rt gluteals, sit and stand longer, avoid sugery    Currently in Pain?  Yes    Pain Score  5     Pain Location  Back    Pain Orientation  Right  Pain Type  Chronic pain    Pain Onset  More than a month ago    Pain Frequency  Constant    Aggravating Factors   sitting/driving, doing too much    Pain Relieving Factors  change of position, stretching                       [facility] Adult PT Treatment/Exercise - 07/27/18 0001      Lumbar Exercises: Stretches   Active Hamstring Stretch  3 reps;20 seconds    Piriformis Stretch  Left;Right;3 reps;20 seconds    Piriformis Stretch Limitations  supine with towel      Lumbar Exercises: Aerobic   Nustep  Level 2x 8 minutes   PT present to discuss progress     Lumbar Exercises: Supine   Ab Set  5 seconds;15 reps    AB Set Limitations  ball squeeze with abdominal bracing 2x10    Bent Knee Raise  20 reps    Bent Knee Raise Limitations  ab bracing with marching 5 x 6 marches     Bridge  5 reps;5 seconds    Other Supine  Lumbar Exercises  horizontal abduction with abdominal bracing red band 2x10      Lumbar Exercises: Sidelying   Other Sidelying Lumbar Exercises  open book stretch x20 bil      Modalities   Modalities  Moist Heat;Electrical Stimulation      Moist Heat Therapy   Number Minutes Moist Heat  15 Minutes    Moist Heat Location  Hip      Electrical Stimulation   Electrical Stimulation Location  Rt gluteals and lumbar spine    Electrical Stimulation Action  IFC    Electrical Stimulation Parameters  15 minutes    Electrical Stimulation Goals  Pain             PT Education - 07/27/18 1038    Education Details  Body mechanics education    Person(s) Educated  Patient    Methods  Explanation;Demonstration;Handout    Comprehension  Verbalized understanding;Returned demonstration       PT Short Term Goals - 07/27/18 1025      PT SHORT TERM GOAL #2   Title  report < or = to 6/10 LBP and Rt gluteal pain with standing for ADLs and self-care    Status  Achieved      PT SHORT TERM GOAL #3   Title  reduce LBP to sit for 25 minutes to allow for increased distance with driving    Status  Achieved        PT Long Term Goals - 07/27/18 1025      PT LONG TERM GOAL #1   Title  be independent in advanced HEP    Time  8    Period  Weeks    Status  On-going      PT LONG TERM GOAL #3   Title  reduce LBP to sit for > or = to 30 minutes to improve tolerance for driving    Status  Achieved      PT LONG TERM GOAL #5   Title  verbalize and understand body mechanics modifications and abdominal bracing with daily tasks for lumbar protection    Status  Achieved      PT LONG TERM GOAL #6   Title  report 50% fewer sleep disturbances due to LBP    Time  8    Period  Weeks  Status  On-going            Plan - 07/27/18 1029    Clinical Impression Statement  Pt reports 50% overall improvement in symptoms since the start of care.  Pt with improved tolerance for sitting and is now able  to tolerate sitting in church for 1 hour.  Pt with improved ability to lift Rt leg in the shower.  Pt has received body mechanics education and will apply these principles to home tasks.  Pt with chronic pain and hip/core weakness and will continue to benefit from skilled PT for strength, flexibility and endurance.      Rehab Potential  Good    PT Frequency  1x / week    PT Duration  8 weeks    PT Treatment/Interventions  ADLs/Self Care Home Management;Cryotherapy;Moist Heat;Ultrasound;Traction;Therapeutic exercise;Neuromuscular re-education;Patient/family education;Manual techniques;Passive range of motion;Dry needling;Taping    PT Next Visit Plan  core strength, manual to Rt gluteals, hip flexibility.  Pt coming 1x/wk for HE p advancement.      PT Home Exercise Plan  Access Code: [email or web address]    Recommended Other Services  initial certification is signed    Consulted and Agree with Plan of Care  Patient       Patient will benefit from skilled therapeutic intervention in order to improve the following deficits and impairments:  Impaired flexibility, Decreased activity tolerance, Decreased range of motion, Decreased strength, Postural dysfunction, Increased muscle spasms, Improper body mechanics, Pain  Visit Diagnosis: Chronic bilateral low back pain with right-sided sciatica  Cramp and spasm  Muscle weakness (generalized)     Problem List Patient Active Problem List   Diagnosis Date Noted  . S/P left TKA 06/19/2015  . Morbid obesity (HCC) 02/28/2015  . S/P right TKA 02/27/2015  . S/P knee replacement 02/27/2015  . Lumbar surgical wound fluid collection   . Wound drainage   . Staphylococcus aureus infection   . Screen for STD (sexually transmitted disease)   . Wound infection after surgery 04/24/2014  . Spinal stenosis of lumbar region with neurogenic claudication 03/30/2014    Lorrene Reid, PT 07/27/18 11:01 AM  [facility] [address], [address] [place], [place], [address] Phone: [phone]   Fax:  [phone]  Name: [name] MRN: [id number] Date of Birth: 07-10-1955

## 2018-07-27 NOTE — Patient Instructions (Signed)
   Lifting Principles  Maintain proper posture and head alignment. Slide object as close as possible before lifting. Move obstacles out of the way. Test before lifting; ask for help if too heavy. Tighten stomach muscles without holding breath. Use smooth movements; do not jerk. Use legs to do the work, and pivot with feet. Distribute the work load symmetrically and close to the center of trunk. Push instead of pull whenever possible.   Squat down and hold basket close to stand. Use leg muscles to do the work.    Avoid twisting or bending back. Pivot around using foot movements, and bend at knees if needed when reaching for articles.        Getting Into / Out of Bed   Lower self to lie down on one side by raising legs and lowering head at the same time. Use arms to assist moving without twisting. Bend both knees to roll onto back if desired. To sit up, start from lying on side, and use same move-ments in reverse. Keep trunk aligned with legs.    Shift weight from front foot to back foot as item is lifted off shelf.    When leaning forward to pick object up from floor, extend one leg out behind. Keep back straight. Hold onto a sturdy support with other hand.      Sit upright, head facing forward. Try using a roll to support lower back. Keep shoulders relaxed, and avoid rounded back. Keep hips level with knees. Avoid crossing legs for long periods.     [facility] [address], [address] [place], [place] [address] Phone # [phone] Fax [phone]  

## 2018-08-11 ENCOUNTER — Ambulatory Visit: Payer: BLUE CROSS/BLUE SHIELD

## 2018-08-11 DIAGNOSIS — G8929 Other chronic pain: Secondary | ICD-10-CM | POA: Diagnosis not present

## 2018-08-11 DIAGNOSIS — M5441 Lumbago with sciatica, right side: Secondary | ICD-10-CM | POA: Diagnosis not present

## 2018-08-11 DIAGNOSIS — M6281 Muscle weakness (generalized): Secondary | ICD-10-CM | POA: Diagnosis not present

## 2018-08-11 DIAGNOSIS — R252 Cramp and spasm: Secondary | ICD-10-CM | POA: Diagnosis not present

## 2018-08-11 NOTE — Therapy (Addendum)
[facility] [address], [place], [place], [address] Phone: [phone]   Fax:  [phone]  Physical Therapy Treatment  Patient Details  Name: [name] MRN: [id number] Date of Birth: Nov 22, 1955 Referring Provider (PT): Jovita Gamma, MD   Encounter Date: 08/11/2018  PT End of Session - 08/11/18 1302    Visit Number  6    Date for PT Re-Evaluation  08/25/18    Authorization Type  [employer]    PT Start Time  8250    PT Stop Time  1310    PT Time Calculation (min)  35 min    Activity Tolerance  Patient tolerated treatment well    Behavior During Therapy  [facility] for tasks assessed/performed       Past Medical History:  Diagnosis Date  . Anxiety   . Arthritis   . Complication of anesthesia    during surgery she could hear staff talking and could feel them preping her arm over 30 yrs. ago  . Fibromyalgia   . GERD (gastroesophageal reflux disease)   . Hypertension   . Hypothyroidism   . Restless leg syndrome   . Staph infection    2015  . Wears glasses     Past Surgical History:  Procedure Laterality Date  . ABDOMINAL HYSTERECTOMY  1981   partial  . APPLICATION OF WOUND VAC N/A 04/25/2014   Procedure: APPLICATION OF WOUND VAC;  Surgeon: Hosie Spangle, MD;  Location: [facility];  Service: Neurosurgery;  Laterality: N/A;  . BACK SURGERY  2007  . BLADDER SUSPENSION     2004  . CERVICAL FUSION  1990  . CERVICAL FUSION     2000  . CYST EXCISION Left 1985   wrist  . CYST EXCISION  1985   chest  . FOOT SURGERY Right 2009   reconstruction with hardware  . FOOT SURGERY Left 2013   reconstruction with hardware  . LUMBAR FUSION  03/2014  . LUMBAR WOUND DEBRIDEMENT N/A 04/25/2014   Procedure: LUMBAR WOUND DEBRIDEMENT Placement of Woundvac);  Surgeon: Hosie Spangle, MD;  Location: [facility];  Service: Neurosurgery;  Laterality: N/A;  . PICC LINE PLACE PERIPHERAL ([place] HX)     2015-infusion  therapy secondary to STAPH infection   . TONSILLECTOMY    . TOTAL KNEE ARTHROPLASTY Bilateral 02/27/2015   Procedure: RIGHT TOTAL KNEE ARTHROPLASTY, LEFT KNEE CORTISONE INJECTION;  Surgeon: Paralee Cancel, MD;  Location: [facility];  Service: Orthopedics;  Laterality: Bilateral;  . TOTAL KNEE ARTHROPLASTY Left 06/19/2015   Procedure: LEFT TOTAL KNEE ARTHROPLASTY;  Surgeon: Paralee Cancel, MD;  Location: [facility];  Service: Orthopedics;  Laterality: Left;  . TUBAL LIGATION      There were no vitals filed for this visit.  Subjective Assessment - 08/11/18 1239    Subjective  I had the flu 2 weeks ago.  I wasn't able to do my exercises for a few days and I could really tell.  The exercises are helping me.      Pertinent History  L3-S1 fusion, stenosis L1-3, epidural 03/2018 without improvement    Patient Stated Goals  reduce pain in Rt gluteals, sit and stand longer, avoid sugery    Currently in Pain?  Yes    Pain Score  4     Pain Location  Back    Pain Orientation  Right    Pain Descriptors / Indicators  Burning    Pain Type  Chronic pain  Pain Onset  More than a month ago    Aggravating Factors   sitting/driving, doing too much    Pain Relieving Factors  change of position, stretching                       [facility] Adult PT Treatment/Exercise - 08/11/18 0001      Lumbar Exercises: Stretches   Active Hamstring Stretch  3 reps;20 seconds    Lower Trunk Rotation  3 reps;20 seconds    Piriformis Stretch  Left;Right;3 reps;20 seconds    Piriformis Stretch Limitations  supine with towel      Lumbar Exercises: Aerobic   Nustep  Level 2x 8 minutes   PT present to discuss progress     Lumbar Exercises: Supine   Ab Set  5 seconds;15 reps    AB Set Limitations  ball squeeze with abdominal bracing 2x10    Other Supine Lumbar Exercises  horizontal abduction and D2 with abdominal bracing red band 2x10      Lumbar Exercises: Sidelying   Other Sidelying Lumbar Exercises  open book  stretch x20 bil             PT Education - 08/11/18 1300    Education Details  Access Code: [email or web address]    Person(s) Educated  Patient    Methods  Explanation;Demonstration;Handout    Comprehension  Verbalized understanding;Returned demonstration       PT Short Term Goals - 07/27/18 1025      PT SHORT TERM GOAL #2   Title  report < or = to 6/10 LBP and Rt gluteal pain with standing for ADLs and self-care    Status  Achieved      PT SHORT TERM GOAL #3   Title  reduce LBP to sit for 25 minutes to allow for increased distance with driving    Status  Achieved        PT Long Term Goals - 07/27/18 1025      PT LONG TERM GOAL #1   Title  be independent in advanced HEP    Time  8    Period  Weeks    Status  On-going      PT LONG TERM GOAL #3   Title  reduce LBP to sit for > or = to 30 minutes to improve tolerance for driving    Status  Achieved      PT LONG TERM GOAL #5   Title  verbalize and understand body mechanics modifications and abdominal bracing with daily tasks for lumbar protection    Status  Achieved      PT LONG TERM GOAL #6   Title  report 50% fewer sleep disturbances due to LBP    Time  8    Period  Weeks    Status  On-going            Plan - 08/11/18 1251    Clinical Impression Statement  Pt with lapse in treatment due to travel and illness.  Pt was not able to do her exercises while she was ill and has been able to get back to exercises this week.  Pt reports 50% overall improvement in lumbar symptoms since the start of care.  Pt tolerated progression of strength exercises well today and does not require any cueing for core activation with exercise.  Pt is making body mechanics modifications at home for lumbar protection with household tasks.  Pt will continue to benefit from  skilled PT for core strength, flexibility and modalities as needed.      Rehab Potential  Good    PT Frequency  1x / week    PT Duration  8 weeks    PT  Treatment/Interventions  ADLs/Self Care Home Management;Cryotherapy;Moist Heat;Ultrasound;Traction;Therapeutic exercise;Neuromuscular re-education;Patient/family education;Manual techniques;Passive range of motion;Dry needling;Taping    PT Next Visit Plan  core strength, manual to Rt gluteals, hip flexibility.  Pt coming 1x/wk for HE p advancement.      PT Home Exercise Plan  Access Code: [email or web address]    Consulted and Agree with Plan of Care  Patient       Patient will benefit from skilled therapeutic intervention in order to improve the following deficits and impairments:  Impaired flexibility, Decreased activity tolerance, Decreased range of motion, Decreased strength, Postural dysfunction, Increased muscle spasms, Improper body mechanics, Pain  Visit Diagnosis: Chronic bilateral low back pain with right-sided sciatica  Cramp and spasm  Muscle weakness (generalized)     Problem List Patient Active Problem List   Diagnosis Date Noted  . S/P left TKA 06/19/2015  . Morbid obesity ([place]) 02/28/2015  . S/P right TKA 02/27/2015  . S/P knee replacement 02/27/2015  . Lumbar surgical wound fluid collection   . Wound drainage   . Staphylococcus aureus infection   . Screen for STD (sexually transmitted disease)   . Wound infection after surgery 04/24/2014  . Spinal stenosis of lumbar region with neurogenic claudication 03/30/2014    Sigurd Sos, PT 08/11/18 1:05 PM PHYSICAL THERAPY DISCHARGE SUMMARY  Visits from Start of Care: 6  Current functional level related to goals / functional outcomes: Pt had a lapse in treatment x 1 month.  Pt was supposed to come to PT 09/08/18 for reassessment and canceled due to Coronavirus.  Pt will continue with HEP for strength gains and follow-up with MD as needed.     Remaining deficits: See above for most current status.     Education / Equipment: HEP, posture/body mechanics Plan: Patient agrees to discharge.  Patient goals were partially met.  Patient is being discharged due to the patient's request.  ?????     Sigurd Sos, PT 09/08/18 11:28 AM    [facility] [address] [place], [place], [address] Phone: [phone]   Fax:  [phone]  Name: ANTANETTE RICHWINE MRN: [id number] Date of Birth: 11/10/1955

## 2018-08-11 NOTE — Patient Instructions (Signed)
Access Code: [email or web address]  URL: [email or web address]  Date: 08/11/2018  Prepared by: Lorrene Reid   Exercises  Sidelying Open Book Thoracic Rotation with Knee on Foam Roll - 10 reps - 1 sets - 2x daily - 7x weekly Supine PNF D2 Flexion with Resistance - 10 reps - 2 sets - 1x daily - 7x weekly

## 2018-08-16 DIAGNOSIS — M0609 Rheumatoid arthritis without rheumatoid factor, multiple sites: Secondary | ICD-10-CM | POA: Diagnosis not present

## 2018-08-16 DIAGNOSIS — R5382 Chronic fatigue, unspecified: Secondary | ICD-10-CM | POA: Diagnosis not present

## 2018-08-16 DIAGNOSIS — M2550 Pain in unspecified joint: Secondary | ICD-10-CM | POA: Diagnosis not present

## 2018-08-16 DIAGNOSIS — Z79899 Other long term (current) drug therapy: Secondary | ICD-10-CM | POA: Diagnosis not present

## 2018-08-18 DIAGNOSIS — M0609 Rheumatoid arthritis without rheumatoid factor, multiple sites: Secondary | ICD-10-CM | POA: Diagnosis not present

## 2018-08-18 DIAGNOSIS — Z79899 Other long term (current) drug therapy: Secondary | ICD-10-CM | POA: Diagnosis not present

## 2018-08-25 ENCOUNTER — Ambulatory Visit: Payer: BLUE CROSS/BLUE SHIELD

## 2018-09-08 ENCOUNTER — Ambulatory Visit: Payer: BLUE CROSS/BLUE SHIELD

## 2018-09-09 DIAGNOSIS — M545 Low back pain: Secondary | ICD-10-CM | POA: Diagnosis not present

## 2018-09-09 DIAGNOSIS — Z981 Arthrodesis status: Secondary | ICD-10-CM | POA: Diagnosis not present

## 2018-09-09 DIAGNOSIS — M4155 Other secondary scoliosis, thoracolumbar region: Secondary | ICD-10-CM | POA: Diagnosis not present

## 2018-10-13 DIAGNOSIS — Z79899 Other long term (current) drug therapy: Secondary | ICD-10-CM | POA: Diagnosis not present

## 2018-10-13 DIAGNOSIS — M0609 Rheumatoid arthritis without rheumatoid factor, multiple sites: Secondary | ICD-10-CM | POA: Diagnosis not present

## 2018-10-18 DIAGNOSIS — E038 Other specified hypothyroidism: Secondary | ICD-10-CM | POA: Diagnosis not present

## 2018-10-18 DIAGNOSIS — E559 Vitamin D deficiency, unspecified: Secondary | ICD-10-CM | POA: Diagnosis not present

## 2018-10-18 DIAGNOSIS — E7849 Other hyperlipidemia: Secondary | ICD-10-CM | POA: Diagnosis not present

## 2018-10-18 DIAGNOSIS — M129 Arthropathy, unspecified: Secondary | ICD-10-CM | POA: Diagnosis not present

## 2018-12-08 DIAGNOSIS — M0609 Rheumatoid arthritis without rheumatoid factor, multiple sites: Secondary | ICD-10-CM | POA: Diagnosis not present

## 2018-12-15 DIAGNOSIS — R5382 Chronic fatigue, unspecified: Secondary | ICD-10-CM | POA: Diagnosis not present

## 2018-12-15 DIAGNOSIS — M0609 Rheumatoid arthritis without rheumatoid factor, multiple sites: Secondary | ICD-10-CM | POA: Diagnosis not present

## 2018-12-15 DIAGNOSIS — M2550 Pain in unspecified joint: Secondary | ICD-10-CM | POA: Diagnosis not present

## 2019-01-10 DIAGNOSIS — M0609 Rheumatoid arthritis without rheumatoid factor, multiple sites: Secondary | ICD-10-CM | POA: Diagnosis not present

## 2019-01-21 DIAGNOSIS — H43393 Other vitreous opacities, bilateral: Secondary | ICD-10-CM | POA: Diagnosis not present

## 2019-01-24 DIAGNOSIS — M0609 Rheumatoid arthritis without rheumatoid factor, multiple sites: Secondary | ICD-10-CM | POA: Diagnosis not present

## 2019-02-07 DIAGNOSIS — M0609 Rheumatoid arthritis without rheumatoid factor, multiple sites: Secondary | ICD-10-CM | POA: Diagnosis not present

## 2019-02-08 DIAGNOSIS — H5213 Myopia, bilateral: Secondary | ICD-10-CM | POA: Diagnosis not present

## 2019-02-23 DIAGNOSIS — Z0000 Encounter for general adult medical examination without abnormal findings: Secondary | ICD-10-CM | POA: Diagnosis not present

## 2019-02-23 DIAGNOSIS — E039 Hypothyroidism, unspecified: Secondary | ICD-10-CM | POA: Diagnosis not present

## 2019-02-23 DIAGNOSIS — I10 Essential (primary) hypertension: Secondary | ICD-10-CM | POA: Diagnosis not present

## 2019-03-02 DIAGNOSIS — K121 Other forms of stomatitis: Secondary | ICD-10-CM | POA: Diagnosis not present

## 2019-03-02 DIAGNOSIS — M549 Dorsalgia, unspecified: Secondary | ICD-10-CM | POA: Diagnosis not present

## 2019-03-02 DIAGNOSIS — Z0000 Encounter for general adult medical examination without abnormal findings: Secondary | ICD-10-CM | POA: Diagnosis not present

## 2019-03-02 DIAGNOSIS — M419 Scoliosis, unspecified: Secondary | ICD-10-CM | POA: Diagnosis not present

## 2019-03-02 DIAGNOSIS — I10 Essential (primary) hypertension: Secondary | ICD-10-CM | POA: Diagnosis not present

## 2019-03-08 DIAGNOSIS — M0609 Rheumatoid arthritis without rheumatoid factor, multiple sites: Secondary | ICD-10-CM | POA: Diagnosis not present

## 2019-03-08 DIAGNOSIS — Z79899 Other long term (current) drug therapy: Secondary | ICD-10-CM | POA: Diagnosis not present

## 2019-03-30 DIAGNOSIS — M2550 Pain in unspecified joint: Secondary | ICD-10-CM | POA: Diagnosis not present

## 2019-03-30 DIAGNOSIS — M5406 Panniculitis affecting regions of neck and back, lumbar region: Secondary | ICD-10-CM | POA: Diagnosis not present

## 2019-03-30 DIAGNOSIS — M0609 Rheumatoid arthritis without rheumatoid factor, multiple sites: Secondary | ICD-10-CM | POA: Diagnosis not present

## 2019-03-30 DIAGNOSIS — M170 Bilateral primary osteoarthritis of knee: Secondary | ICD-10-CM | POA: Diagnosis not present

## 2019-03-30 DIAGNOSIS — Z1331 Encounter for screening for depression: Secondary | ICD-10-CM | POA: Diagnosis not present

## 2019-03-30 DIAGNOSIS — E039 Hypothyroidism, unspecified: Secondary | ICD-10-CM | POA: Diagnosis not present

## 2019-03-30 DIAGNOSIS — M129 Arthropathy, unspecified: Secondary | ICD-10-CM | POA: Diagnosis not present

## 2019-03-30 DIAGNOSIS — R5382 Chronic fatigue, unspecified: Secondary | ICD-10-CM | POA: Diagnosis not present

## 2019-03-30 DIAGNOSIS — M150 Primary generalized (osteo)arthritis: Secondary | ICD-10-CM | POA: Diagnosis not present

## 2019-05-27 DIAGNOSIS — M4155 Other secondary scoliosis, thoracolumbar region: Secondary | ICD-10-CM | POA: Diagnosis not present

## 2019-05-27 DIAGNOSIS — M5136 Other intervertebral disc degeneration, lumbar region: Secondary | ICD-10-CM | POA: Diagnosis not present

## 2019-05-27 DIAGNOSIS — M47816 Spondylosis without myelopathy or radiculopathy, lumbar region: Secondary | ICD-10-CM | POA: Diagnosis not present

## 2019-05-27 DIAGNOSIS — M48061 Spinal stenosis, lumbar region without neurogenic claudication: Secondary | ICD-10-CM | POA: Diagnosis not present

## 2019-05-27 DIAGNOSIS — Z981 Arthrodesis status: Secondary | ICD-10-CM | POA: Diagnosis not present

## 2019-05-27 DIAGNOSIS — M545 Low back pain: Secondary | ICD-10-CM | POA: Diagnosis not present

## 2019-06-08 DIAGNOSIS — M797 Fibromyalgia: Secondary | ICD-10-CM | POA: Diagnosis not present

## 2019-06-08 DIAGNOSIS — M2550 Pain in unspecified joint: Secondary | ICD-10-CM | POA: Diagnosis not present

## 2019-06-08 DIAGNOSIS — M0609 Rheumatoid arthritis without rheumatoid factor, multiple sites: Secondary | ICD-10-CM | POA: Diagnosis not present

## 2019-06-08 DIAGNOSIS — M150 Primary generalized (osteo)arthritis: Secondary | ICD-10-CM | POA: Diagnosis not present

## 2019-06-22 DIAGNOSIS — I10 Essential (primary) hypertension: Secondary | ICD-10-CM | POA: Diagnosis not present

## 2019-06-22 DIAGNOSIS — H8110 Benign paroxysmal vertigo, unspecified ear: Secondary | ICD-10-CM | POA: Diagnosis not present

## 2019-08-17 DIAGNOSIS — M810 Age-related osteoporosis without current pathological fracture: Secondary | ICD-10-CM | POA: Diagnosis not present

## 2019-08-17 DIAGNOSIS — M25559 Pain in unspecified hip: Secondary | ICD-10-CM | POA: Diagnosis not present

## 2019-08-17 DIAGNOSIS — M25552 Pain in left hip: Secondary | ICD-10-CM | POA: Diagnosis not present

## 2019-08-17 DIAGNOSIS — Z780 Asymptomatic menopausal state: Secondary | ICD-10-CM | POA: Diagnosis not present

## 2019-08-17 DIAGNOSIS — M7062 Trochanteric bursitis, left hip: Secondary | ICD-10-CM | POA: Diagnosis not present

## 2019-09-14 DIAGNOSIS — M48062 Spinal stenosis, lumbar region with neurogenic claudication: Secondary | ICD-10-CM | POA: Diagnosis not present

## 2019-09-14 DIAGNOSIS — M5416 Radiculopathy, lumbar region: Secondary | ICD-10-CM | POA: Diagnosis not present

## 2019-09-14 DIAGNOSIS — M47816 Spondylosis without myelopathy or radiculopathy, lumbar region: Secondary | ICD-10-CM | POA: Diagnosis not present

## 2019-09-14 DIAGNOSIS — Z981 Arthrodesis status: Secondary | ICD-10-CM | POA: Diagnosis not present

## 2019-09-27 DIAGNOSIS — M129 Arthropathy, unspecified: Secondary | ICD-10-CM | POA: Diagnosis not present

## 2019-09-27 DIAGNOSIS — E669 Obesity, unspecified: Secondary | ICD-10-CM | POA: Diagnosis not present

## 2019-09-27 DIAGNOSIS — Z1331 Encounter for screening for depression: Secondary | ICD-10-CM | POA: Diagnosis not present

## 2019-09-27 DIAGNOSIS — E7849 Other hyperlipidemia: Secondary | ICD-10-CM | POA: Diagnosis not present

## 2019-09-27 DIAGNOSIS — E559 Vitamin D deficiency, unspecified: Secondary | ICD-10-CM | POA: Diagnosis not present

## 2019-09-27 DIAGNOSIS — M797 Fibromyalgia: Secondary | ICD-10-CM | POA: Diagnosis not present

## 2019-09-27 DIAGNOSIS — F418 Other specified anxiety disorders: Secondary | ICD-10-CM | POA: Diagnosis not present

## 2019-09-27 DIAGNOSIS — E039 Hypothyroidism, unspecified: Secondary | ICD-10-CM | POA: Diagnosis not present

## 2019-09-27 DIAGNOSIS — I10 Essential (primary) hypertension: Secondary | ICD-10-CM | POA: Diagnosis not present

## 2019-09-29 DIAGNOSIS — M5136 Other intervertebral disc degeneration, lumbar region: Secondary | ICD-10-CM | POA: Diagnosis not present

## 2019-09-29 DIAGNOSIS — M48061 Spinal stenosis, lumbar region without neurogenic claudication: Secondary | ICD-10-CM | POA: Diagnosis not present

## 2019-09-29 DIAGNOSIS — M4726 Other spondylosis with radiculopathy, lumbar region: Secondary | ICD-10-CM | POA: Diagnosis not present

## 2019-09-29 DIAGNOSIS — M4805 Spinal stenosis, thoracolumbar region: Secondary | ICD-10-CM | POA: Diagnosis not present

## 2019-10-04 DIAGNOSIS — M797 Fibromyalgia: Secondary | ICD-10-CM | POA: Diagnosis not present

## 2019-10-04 DIAGNOSIS — E669 Obesity, unspecified: Secondary | ICD-10-CM | POA: Diagnosis not present

## 2019-10-04 DIAGNOSIS — M150 Primary generalized (osteo)arthritis: Secondary | ICD-10-CM | POA: Diagnosis not present

## 2019-10-04 DIAGNOSIS — M2550 Pain in unspecified joint: Secondary | ICD-10-CM | POA: Diagnosis not present

## 2019-10-04 DIAGNOSIS — M1120 Other chondrocalcinosis, unspecified site: Secondary | ICD-10-CM | POA: Diagnosis not present

## 2019-10-04 DIAGNOSIS — Z6830 Body mass index (BMI) 30.0-30.9, adult: Secondary | ICD-10-CM | POA: Diagnosis not present

## 2019-10-04 DIAGNOSIS — M0609 Rheumatoid arthritis without rheumatoid factor, multiple sites: Secondary | ICD-10-CM | POA: Diagnosis not present

## 2019-10-04 DIAGNOSIS — Z79899 Other long term (current) drug therapy: Secondary | ICD-10-CM | POA: Diagnosis not present

## 2019-11-21 DIAGNOSIS — Z20822 Contact with and (suspected) exposure to covid-19: Secondary | ICD-10-CM | POA: Diagnosis not present

## 2019-11-21 DIAGNOSIS — Z6833 Body mass index (BMI) 33.0-33.9, adult: Secondary | ICD-10-CM | POA: Diagnosis not present

## 2019-12-02 DIAGNOSIS — M79672 Pain in left foot: Secondary | ICD-10-CM | POA: Diagnosis not present

## 2019-12-30 DIAGNOSIS — M48062 Spinal stenosis, lumbar region with neurogenic claudication: Secondary | ICD-10-CM | POA: Diagnosis not present

## 2019-12-30 DIAGNOSIS — M4155 Other secondary scoliosis, thoracolumbar region: Secondary | ICD-10-CM | POA: Diagnosis not present

## 2019-12-30 DIAGNOSIS — M5136 Other intervertebral disc degeneration, lumbar region: Secondary | ICD-10-CM | POA: Diagnosis not present

## 2019-12-30 DIAGNOSIS — I10 Essential (primary) hypertension: Secondary | ICD-10-CM | POA: Diagnosis not present

## 2020-01-04 DIAGNOSIS — Z6830 Body mass index (BMI) 30.0-30.9, adult: Secondary | ICD-10-CM | POA: Diagnosis not present

## 2020-01-04 DIAGNOSIS — Z01419 Encounter for gynecological examination (general) (routine) without abnormal findings: Secondary | ICD-10-CM | POA: Diagnosis not present

## 2020-01-04 DIAGNOSIS — Z1231 Encounter for screening mammogram for malignant neoplasm of breast: Secondary | ICD-10-CM | POA: Diagnosis not present

## 2020-01-19 DIAGNOSIS — H8113 Benign paroxysmal vertigo, bilateral: Secondary | ICD-10-CM | POA: Diagnosis not present

## 2020-02-02 DIAGNOSIS — M5116 Intervertebral disc disorders with radiculopathy, lumbar region: Secondary | ICD-10-CM | POA: Diagnosis not present

## 2020-02-02 DIAGNOSIS — M5415 Radiculopathy, thoracolumbar region: Secondary | ICD-10-CM | POA: Diagnosis not present

## 2020-02-10 DIAGNOSIS — Z6832 Body mass index (BMI) 32.0-32.9, adult: Secondary | ICD-10-CM | POA: Diagnosis not present

## 2020-02-10 DIAGNOSIS — M2550 Pain in unspecified joint: Secondary | ICD-10-CM | POA: Diagnosis not present

## 2020-02-10 DIAGNOSIS — M797 Fibromyalgia: Secondary | ICD-10-CM | POA: Diagnosis not present

## 2020-02-10 DIAGNOSIS — Z79899 Other long term (current) drug therapy: Secondary | ICD-10-CM | POA: Diagnosis not present

## 2020-02-10 DIAGNOSIS — M0609 Rheumatoid arthritis without rheumatoid factor, multiple sites: Secondary | ICD-10-CM | POA: Diagnosis not present

## 2020-02-10 DIAGNOSIS — M150 Primary generalized (osteo)arthritis: Secondary | ICD-10-CM | POA: Diagnosis not present

## 2020-02-10 DIAGNOSIS — E669 Obesity, unspecified: Secondary | ICD-10-CM | POA: Diagnosis not present

## 2020-02-10 DIAGNOSIS — M1120 Other chondrocalcinosis, unspecified site: Secondary | ICD-10-CM | POA: Diagnosis not present

## 2020-03-07 ENCOUNTER — Other Ambulatory Visit: Payer: Self-pay | Admitting: Neurological Surgery

## 2020-03-07 DIAGNOSIS — M5136 Other intervertebral disc degeneration, lumbar region: Secondary | ICD-10-CM

## 2020-03-21 ENCOUNTER — Ambulatory Visit (HOSPITAL_COMMUNITY)
Admission: RE | Admit: 2020-03-21 | Discharge: 2020-03-21 | Disposition: A | Discharge status: Home / Self Care | Payer: PPO | Source: Ambulatory Visit | Attending: Neurological Surgery | Admitting: Neurological Surgery

## 2020-03-21 ENCOUNTER — Other Ambulatory Visit: Payer: Self-pay

## 2020-03-21 DIAGNOSIS — M4714 Other spondylosis with myelopathy, thoracic region: Secondary | ICD-10-CM | POA: Diagnosis not present

## 2020-03-21 DIAGNOSIS — Z981 Arthrodesis status: Secondary | ICD-10-CM | POA: Diagnosis not present

## 2020-03-21 DIAGNOSIS — M419 Scoliosis, unspecified: Secondary | ICD-10-CM | POA: Insufficient documentation

## 2020-03-21 DIAGNOSIS — M5136 Other intervertebral disc degeneration, lumbar region: Secondary | ICD-10-CM

## 2020-03-21 DIAGNOSIS — M5033 Other cervical disc degeneration, cervicothoracic region: Secondary | ICD-10-CM | POA: Diagnosis not present

## 2020-03-21 DIAGNOSIS — M4804 Spinal stenosis, thoracic region: Secondary | ICD-10-CM | POA: Insufficient documentation

## 2020-03-21 DIAGNOSIS — M48061 Spinal stenosis, lumbar region without neurogenic claudication: Secondary | ICD-10-CM | POA: Diagnosis not present

## 2020-03-21 DIAGNOSIS — M5134 Other intervertebral disc degeneration, thoracic region: Secondary | ICD-10-CM | POA: Diagnosis not present

## 2020-03-21 MED ORDER — LIDOCAINE HCL (PF) 1 % IJ SOLN
5.0000 mL | Freq: Once | INTRAMUSCULAR | Status: AC
  Administered 2020-03-21: 5 mL via INTRADERMAL

## 2020-03-21 MED ORDER — ONDANSETRON HCL 4 MG/2ML IJ SOLN: 4.0000 mg | Freq: Four times a day (QID) | INTRAMUSCULAR | Status: DC | PRN

## 2020-03-21 MED ORDER — DEXAMETHASONE 4 MG PO TABS
4.0000 mg | ORAL_TABLET | Freq: Once | ORAL | Status: AC
  Administered 2020-03-21: 4 mg via ORAL
  Filled 2020-03-21: qty 1

## 2020-03-21 MED ORDER — DIAZEPAM 5 MG PO TABS
ORAL_TABLET | ORAL | Status: AC
  Filled 2020-03-21: qty 2

## 2020-03-21 MED ORDER — OXYCODONE-ACETAMINOPHEN 5-325 MG PO TABS
ORAL_TABLET | ORAL | Status: AC
  Filled 2020-03-21: qty 1

## 2020-03-21 MED ORDER — OXYCODONE-ACETAMINOPHEN 5-325 MG PO TABS
1.0000 | ORAL_TABLET | ORAL | Status: DC | PRN
  Administered 2020-03-21: 1 via ORAL

## 2020-03-21 MED ORDER — IOHEXOL 300 MG/ML  SOLN
10.0000 mL | Freq: Once | INTRAMUSCULAR | Status: AC | PRN
  Administered 2020-03-21: 10 mL via INTRATHECAL

## 2020-03-21 MED ORDER — DIAZEPAM 5 MG PO TABS
10.0000 mg | ORAL_TABLET | Freq: Once | ORAL | Status: AC
  Administered 2020-03-21: 10 mg via ORAL

## 2020-03-21 NOTE — Procedures (Signed)
[name] is a 63 year old individual who in 2015 underwent surgical decompression and fusion from L3 to the sacrum.  Her postoperative course was complicated by significant spinal wound infection.  She gradually recovered but over time she has developed progressive difficulty with back pain and leg pain that is worsened to the point that she cannot walk about 100 feet without having to stop and rest briefly.  Postoperative films demonstrate that she has had advanced degenerative changes occurring at L1-L2 T12-L1 and a progressive kyphosis that is developed.  Her husband notes that she cannot stand straight and erect for more than a minute or 2 and when she walks she almost immediately keels forward.  Plain x-rays also demonstrate that she is developing a progressive degenerative scoliosis above her fusion.  A total myelogram is now being performed to better evaluate her scoliosis.  Pre op Dx: Thoracic myelopathy with scoliosis Post op Dx: Thoracic myelopathy with scoliosis history of lumbar fusion [name] Surgeon: Wannetta Langland Puncture level: L3-4 Fluid color: Clear colorless Injection: Iohexol 300, 9 mL Findings: Severe stenosis at L2-3 L1-2 T12-L1 and most likely at T11-T12.  Cervical imaging will be by CT only as minimal contrast is getting by severe stenosis.

## 2020-03-21 NOTE — Discharge Instructions (Signed)
Myelogram  A myelogram is an imaging study of the spinal cord and the places where nerves attach to the spinal cord (nerve roots). A dye (contrast material) is injected into the spine before the X-ray. This provides a clearer image for your health care provider to see. You may need this study done if you have a spinal cord problem that cannot be diagnosed with other imaging studies, such as a CT scan or an MRI. You may also have this study to check your spine after surgery. Tell a health care provider about:  Any allergies you have, especially to iodine.  All medicines you are taking, including vitamins, herbs, eye drops, creams, and over-the-counter medicines.  Any problems you or family members have had with anesthetic medicines or contrast material.  Any blood disorders you have.  Any surgeries you have had.  Any medical conditions you have or have had, including asthma.  Whether you are pregnant or may be pregnant. What are the risks? Generally, this is a safe procedure. However, problems may occur, including:  Infection.  Bleeding.  Allergic reaction to medicines or dyes.  Damage to your spinal cord or nerves.  Loss or leaking of spinal fluid. This can lead to headaches.  Damage to kidneys.  Seizures. This is rare. What happens before the procedure?  Follow instructions from your health care provider about eating or drinking restrictions. You may be asked to drink more fluids.  Ask your health care provider about changing or stopping your regular medicines. This is especially important if you are taking diabetes medicines or blood thinners.  Plan to have someone take you home from the hospital or clinic.  If you will be going home right after the procedure, plan to have someone with you for 24 hours. What happens during the procedure?  You will lie face down on a table.  Your health care provider will locate the best injection site on your spine. This is most  often in the lower back.  The area of injection will be washed with soap.  You will be given a medicine to numb the area (local anesthetic).  Your health care provider will insert a long needle into the space around your spinal cord (subarachnoid space).  A sample of spinal fluid may be taken and sent to the lab for testing.  The contrast material will be injected into the subarachnoid space.  The exam table may be tilted to help the contrast material flow up or down your spine.  The X-ray will take images of your spinal cord for your health care provider to examine.  A bandage (dressing) may be placed over the injection site. The procedure may vary among health care providers and hospitals. What can I expect after this procedure?  Your blood pressure, heart rate, breathing rate, and blood oxygen level may be monitored until you leave the hospital or clinic.  You may have: ? Soreness on your injection site. ? A mild headache.  You will be asked to lie down with your head raised (elevated). This reduces the risk of a headache.  It is up to you to get the results of your procedure. Ask your health care provider, or the department that is doing the procedure, when your results will be ready. Follow these instructions at home:   Rest as told by your health care provider. Lie flat with your head slightly elevated to reduce the risk of a headache.  Do not bend, lift, or do hard work   for 24-48 hours, or as told by your health care provider.  Take over-the-counter and prescription medicines only as told by your health care provider.  Take care of your dressing as told by your health care provider.  Drink enough fluid to keep your urine pale yellow.  Bathe or shower as told by your health care provider. Contact a health care provider if:  You have a fever.  You have a headache that lasts longer than 24 hours.  You feel nauseous or vomit.  You have a stiff neck or numbness in  your legs.  You are unable to urinate or have a bowel movement.  You develop a rash, itching, or sneezing. Get help right away if:  You have new symptoms or your symptoms get worse.  You have a seizure.  You have trouble breathing. Summary  A myelogram is an imaging study of the spinal cord and the places where nerves attach to the spinal cord (nerve roots).  Before the procedure, follow instructions from your health care provider about changing or stopping your regular medicines, and eating and drinking restrictions.  After this procedure, you will be asked to lie down with your head raised (elevated). This reduces your risk of a headache.  Do not bend, lift, or do hard work for 24-48 hours, or as told by your health care provider.  Contact a health care provider if you have a stiff neck or numbness in your legs. Get help right away if symptoms get worse, or you have a seizure or trouble breathing. This information is not intended to replace advice given to you by your health care provider. Make sure you discuss any questions you have with your health care provider. Document Revised: 08/18/2018 Document Reviewed: 08/19/2018 Elsevier Patient Education  2020 Elsevier Inc.  

## 2020-03-23 DIAGNOSIS — Z20822 Contact with and (suspected) exposure to covid-19: Secondary | ICD-10-CM | POA: Diagnosis not present

## 2020-03-23 DIAGNOSIS — M5136 Other intervertebral disc degeneration, lumbar region: Secondary | ICD-10-CM | POA: Diagnosis not present

## 2020-03-26 ENCOUNTER — Ambulatory Visit (HOSPITAL_COMMUNITY)
Admission: RE | Admit: 2020-03-26 | Discharge: 2020-03-26 | Disposition: A | Discharge status: Home / Self Care | Payer: Medicare Other | Source: Ambulatory Visit | Attending: Pulmonary Disease | Admitting: Pulmonary Disease

## 2020-03-26 ENCOUNTER — Other Ambulatory Visit (HOSPITAL_COMMUNITY): Payer: Self-pay | Admitting: Adult Health

## 2020-03-26 DIAGNOSIS — J069 Acute upper respiratory infection, unspecified: Secondary | ICD-10-CM | POA: Diagnosis not present

## 2020-03-26 DIAGNOSIS — U071 COVID-19: Secondary | ICD-10-CM | POA: Diagnosis present

## 2020-03-26 DIAGNOSIS — Z20822 Contact with and (suspected) exposure to covid-19: Secondary | ICD-10-CM | POA: Diagnosis not present

## 2020-03-26 DIAGNOSIS — Z23 Encounter for immunization: Secondary | ICD-10-CM | POA: Diagnosis not present

## 2020-03-26 MED ORDER — SODIUM CHLORIDE 0.9 % IV SOLN: INTRAVENOUS | Status: DC | PRN

## 2020-03-26 MED ORDER — ALBUTEROL SULFATE HFA 108 (90 BASE) MCG/ACT IN AERS: 2.0000 | INHALATION_SPRAY | Freq: Once | RESPIRATORY_TRACT | Status: DC | PRN

## 2020-03-26 MED ORDER — SODIUM CHLORIDE 0.9 % IV SOLN
1200.0000 mg | Freq: Once | INTRAVENOUS | Status: AC
  Administered 2020-03-26: 1200 mg via INTRAVENOUS

## 2020-03-26 MED ORDER — DIPHENHYDRAMINE HCL 50 MG/ML IJ SOLN: 50.0000 mg | Freq: Once | INTRAMUSCULAR | Status: DC | PRN

## 2020-03-26 MED ORDER — ONDANSETRON HCL 4 MG/2ML IJ SOLN
4.0000 mg | Freq: Once | INTRAMUSCULAR | Status: AC
  Administered 2020-03-26: 4 mg via INTRAVENOUS
  Filled 2020-03-26: qty 2

## 2020-03-26 MED ORDER — EPINEPHRINE 0.3 MG/0.3ML IJ SOAJ: 0.3000 mg | Freq: Once | INTRAMUSCULAR | Status: DC | PRN

## 2020-03-26 MED ORDER — METHYLPREDNISOLONE SODIUM SUCC 125 MG IJ SOLR: 125.0000 mg | Freq: Once | INTRAMUSCULAR | Status: DC | PRN

## 2020-03-26 MED ORDER — FAMOTIDINE IN NACL 20-0.9 MG/50ML-% IV SOLN: 20.0000 mg | Freq: Once | INTRAVENOUS | Status: DC | PRN

## 2020-03-26 NOTE — Progress Notes (Signed)
I connected by phone with [name] on 03/26/2020 at 11:59 AM to discuss the potential use of a new treatment for mild to moderate COVID-19 viral infection in non-hospitalized patients.  This patient is a 64 y.o. female that meets the FDA criteria for Emergency Use Authorization of COVID monoclonal antibody casirivimab/imdevimab or bamlanivimab/eteseviamb.  Has a (+) direct SARS-CoV-2 viral test result  Has mild or moderate COVID-19   Is NOT hospitalized due to COVID-19  Is within 10 days of symptom onset  Has at least one of the high risk factor(s) for progression to severe COVID-19 and/or hospitalization as defined in EUA. Specific high risk criteria : Older age (>/= 65 yo) and BMI > 25  Sx onset 9/30  I have spoken and communicated the following to the patient or parent/caregiver regarding COVID monoclonal antibody treatment:  1. FDA has authorized the emergency use for the treatment of mild to moderate COVID-19 in adults and pediatric patients with positive results of direct SARS-CoV-2 viral testing who are 35 years of age and older weighing at least 40 kg, and who are at high risk for progressing to severe COVID-19 and/or hospitalization.  2. The significant known and potential risks and benefits of COVID monoclonal antibody, and the extent to which such potential risks and benefits are unknown.  3. Information on available alternative treatments and the risks and benefits of those alternatives, including clinical trials.  4. Patients treated with COVID monoclonal antibody should continue to self-isolate and use infection control measures (e.g., wear mask, isolate, social distance, avoid sharing personal items, clean and disinfect "high touch" surfaces, and frequent handwashing) according to CDC guidelines.   5. The patient or parent/caregiver has the option to accept or refuse COVID monoclonal antibody treatment.  After reviewing this information with the patient, the patient  has agreed to receive one of the available covid 19 monoclonal antibodies and will be provided an appropriate fact sheet prior to infusion. [name], NP 03/26/2020 11:59 AM

## 2020-03-26 NOTE — Progress Notes (Signed)
  Diagnosis: COVID-19  Physician: Dr. Patrick  Wright  Procedure: Covid Infusion Clinic Med: casirivimab\imdevimab infusion - Provided patient with casirivimab\imdevimab fact sheet for patients, parents and caregivers prior to infusion.  Complications: No immediate complications noted.  Discharge: Discharged home   [name] 03/26/2020  

## 2020-03-26 NOTE — Discharge Instructions (Signed)

## 2020-03-29 ENCOUNTER — Other Ambulatory Visit: Payer: Self-pay | Admitting: Neurological Surgery

## 2020-04-05 ENCOUNTER — Other Ambulatory Visit (HOSPITAL_COMMUNITY): Payer: Self-pay | Admitting: Neurological Surgery

## 2020-04-05 DIAGNOSIS — M40204 Unspecified kyphosis, thoracic region: Secondary | ICD-10-CM

## 2020-04-11 DIAGNOSIS — A09 Infectious gastroenteritis and colitis, unspecified: Secondary | ICD-10-CM | POA: Diagnosis not present

## 2020-04-11 DIAGNOSIS — R011 Cardiac murmur, unspecified: Secondary | ICD-10-CM | POA: Diagnosis not present

## 2020-04-11 DIAGNOSIS — G933 Postviral fatigue syndrome: Secondary | ICD-10-CM | POA: Diagnosis not present

## 2020-04-11 DIAGNOSIS — R110 Nausea: Secondary | ICD-10-CM | POA: Diagnosis not present

## 2020-04-11 DIAGNOSIS — R5383 Other fatigue: Secondary | ICD-10-CM | POA: Diagnosis not present

## 2020-04-17 DIAGNOSIS — M5135 Other intervertebral disc degeneration, thoracolumbar region: Secondary | ICD-10-CM | POA: Diagnosis not present

## 2020-04-17 DIAGNOSIS — M5134 Other intervertebral disc degeneration, thoracic region: Secondary | ICD-10-CM | POA: Diagnosis not present

## 2020-05-03 ENCOUNTER — Ambulatory Visit (HOSPITAL_COMMUNITY)
Admission: RE | Admit: 2020-05-03 | Discharge: 2020-05-03 | Disposition: A | Discharge status: Home / Self Care | Payer: PPO | Source: Ambulatory Visit | Attending: Neurological Surgery | Admitting: Neurological Surgery

## 2020-05-03 ENCOUNTER — Other Ambulatory Visit: Payer: Self-pay

## 2020-05-03 DIAGNOSIS — M40204 Unspecified kyphosis, thoracic region: Secondary | ICD-10-CM | POA: Diagnosis not present

## 2020-05-03 DIAGNOSIS — R011 Cardiac murmur, unspecified: Secondary | ICD-10-CM | POA: Diagnosis not present

## 2020-05-03 DIAGNOSIS — M4807 Spinal stenosis, lumbosacral region: Secondary | ICD-10-CM | POA: Diagnosis not present

## 2020-05-03 DIAGNOSIS — M48061 Spinal stenosis, lumbar region without neurogenic claudication: Secondary | ICD-10-CM | POA: Diagnosis not present

## 2020-05-03 DIAGNOSIS — M5126 Other intervertebral disc displacement, lumbar region: Secondary | ICD-10-CM | POA: Diagnosis not present

## 2020-05-03 DIAGNOSIS — M5125 Other intervertebral disc displacement, thoracolumbar region: Secondary | ICD-10-CM | POA: Diagnosis not present

## 2020-05-07 DIAGNOSIS — M797 Fibromyalgia: Secondary | ICD-10-CM | POA: Diagnosis not present

## 2020-05-07 DIAGNOSIS — M1120 Other chondrocalcinosis, unspecified site: Secondary | ICD-10-CM | POA: Diagnosis not present

## 2020-05-07 DIAGNOSIS — M0609 Rheumatoid arthritis without rheumatoid factor, multiple sites: Secondary | ICD-10-CM | POA: Diagnosis not present

## 2020-05-07 DIAGNOSIS — E669 Obesity, unspecified: Secondary | ICD-10-CM | POA: Diagnosis not present

## 2020-05-07 DIAGNOSIS — Z79899 Other long term (current) drug therapy: Secondary | ICD-10-CM | POA: Diagnosis not present

## 2020-05-07 DIAGNOSIS — M150 Primary generalized (osteo)arthritis: Secondary | ICD-10-CM | POA: Diagnosis not present

## 2020-05-07 DIAGNOSIS — Z6832 Body mass index (BMI) 32.0-32.9, adult: Secondary | ICD-10-CM | POA: Diagnosis not present

## 2020-05-29 DIAGNOSIS — I10 Essential (primary) hypertension: Secondary | ICD-10-CM | POA: Diagnosis not present

## 2020-05-29 DIAGNOSIS — E039 Hypothyroidism, unspecified: Secondary | ICD-10-CM | POA: Diagnosis not present

## 2020-05-29 DIAGNOSIS — E785 Hyperlipidemia, unspecified: Secondary | ICD-10-CM | POA: Diagnosis not present

## 2020-05-29 DIAGNOSIS — M797 Fibromyalgia: Secondary | ICD-10-CM | POA: Diagnosis not present

## 2020-05-29 DIAGNOSIS — M170 Bilateral primary osteoarthritis of knee: Secondary | ICD-10-CM | POA: Diagnosis not present

## 2020-05-29 DIAGNOSIS — M5416 Radiculopathy, lumbar region: Secondary | ICD-10-CM | POA: Diagnosis not present

## 2020-05-29 DIAGNOSIS — E559 Vitamin D deficiency, unspecified: Secondary | ICD-10-CM | POA: Diagnosis not present

## 2020-05-29 DIAGNOSIS — I700 Atherosclerosis of aorta: Secondary | ICD-10-CM | POA: Diagnosis not present

## 2020-05-29 DIAGNOSIS — R269 Unspecified abnormalities of gait and mobility: Secondary | ICD-10-CM | POA: Diagnosis not present

## 2020-05-29 DIAGNOSIS — M0600 Rheumatoid arthritis without rheumatoid factor, unspecified site: Secondary | ICD-10-CM | POA: Diagnosis not present

## 2020-05-29 DIAGNOSIS — D509 Iron deficiency anemia, unspecified: Secondary | ICD-10-CM | POA: Diagnosis not present

## 2020-05-31 DIAGNOSIS — Z0000 Encounter for general adult medical examination without abnormal findings: Secondary | ICD-10-CM | POA: Diagnosis not present

## 2020-05-31 DIAGNOSIS — E039 Hypothyroidism, unspecified: Secondary | ICD-10-CM | POA: Diagnosis not present

## 2020-05-31 DIAGNOSIS — I10 Essential (primary) hypertension: Secondary | ICD-10-CM | POA: Diagnosis not present

## 2020-06-01 DIAGNOSIS — M4155 Other secondary scoliosis, thoracolumbar region: Secondary | ICD-10-CM | POA: Diagnosis not present

## 2020-06-01 DIAGNOSIS — Z6832 Body mass index (BMI) 32.0-32.9, adult: Secondary | ICD-10-CM | POA: Diagnosis not present

## 2020-06-01 DIAGNOSIS — M48062 Spinal stenosis, lumbar region with neurogenic claudication: Secondary | ICD-10-CM | POA: Diagnosis not present

## 2020-06-01 DIAGNOSIS — I10 Essential (primary) hypertension: Secondary | ICD-10-CM | POA: Diagnosis not present

## 2020-06-05 DIAGNOSIS — I700 Atherosclerosis of aorta: Secondary | ICD-10-CM | POA: Diagnosis not present

## 2020-06-05 DIAGNOSIS — M150 Primary generalized (osteo)arthritis: Secondary | ICD-10-CM | POA: Diagnosis not present

## 2020-06-05 DIAGNOSIS — Z23 Encounter for immunization: Secondary | ICD-10-CM | POA: Diagnosis not present

## 2020-06-05 DIAGNOSIS — M0600 Rheumatoid arthritis without rheumatoid factor, unspecified site: Secondary | ICD-10-CM | POA: Diagnosis not present

## 2020-06-05 DIAGNOSIS — Z0000 Encounter for general adult medical examination without abnormal findings: Secondary | ICD-10-CM | POA: Diagnosis not present

## 2020-06-05 DIAGNOSIS — F329 Major depressive disorder, single episode, unspecified: Secondary | ICD-10-CM | POA: Diagnosis not present

## 2020-06-05 DIAGNOSIS — K219 Gastro-esophageal reflux disease without esophagitis: Secondary | ICD-10-CM | POA: Diagnosis not present

## 2020-06-05 DIAGNOSIS — E039 Hypothyroidism, unspecified: Secondary | ICD-10-CM | POA: Diagnosis not present

## 2020-06-05 DIAGNOSIS — I10 Essential (primary) hypertension: Secondary | ICD-10-CM | POA: Diagnosis not present

## 2020-06-25 NOTE — Pre-Procedure Instructions (Signed)
[facility] [address], [place] - [address] [place] [address] Phone: [phone] Fax: [phone]      Your procedure is scheduled on Thursday January 6th.  Report to [facility] Main Entrance "A" at 5:30 A.M., and check in at the Admitting office.  Call this number if you have problems the morning of surgery:  [phone]  Call [phone] if you have any questions prior to your surgery date Monday-Friday 8am-4pm    Remember:  Do not eat after midnight the night before your surgery     Take these medicines the morning of surgery with A SIP OF WATER  acetaminophen (TYLENOL) if needed ALPRAZolam Prudy Feeler) if needed buPROPion (WELLBUTRIN XL)  esomeprazole (NEXIUM) hydroxychloroquine (PLAQUENIL) levothyroxine (SYNTHROID) pramipexole (MIRAPEX)   As of today, STOP taking any Aspirin (unless otherwise instructed by your surgeon) Aleve, Naproxen, Ibuprofen, Motrin, Advil, Goody's, BC's, all herbal medications, fish oil, and all vitamins.                      Do not wear jewelry, make up, or nail polish            Do not wear lotions, powders, perfumes/colognes, or deodorant.            Do not shave 48 hours prior to surgery.  Men may shave face and neck.            Do not bring valuables to the hospital.            [facility] is not responsible for any belongings or valuables.  Do NOT Smoke (Tobacco/Vaping) or drink Alcohol 24 hours prior to your procedure If you use a CPAP at night, you may bring all equipment for your overnight stay.   Contacts, glasses, dentures or bridgework may not be worn into surgery.      For patients admitted to the hospital, discharge time will be determined by your treatment team.   Patients discharged the day of surgery will not be allowed to drive home, and someone needs to stay with them for 24 hours.    Special instructions:   [facility]- Preparing For Surgery  Before surgery, you can play an  important role. Because skin is not sterile, your skin needs to be as free of germs as possible. You can reduce the number of germs on your skin by washing with CHG (chlorahexidine gluconate) Soap before surgery.  CHG is an antiseptic cleaner which kills germs and bonds with the skin to continue killing germs even after washing.    Oral Hygiene is also important to reduce your risk of infection.  Remember - BRUSH YOUR TEETH THE MORNING OF SURGERY WITH YOUR REGULAR TOOTHPASTE  Please do not use if you have an allergy to CHG or antibacterial soaps. If your skin becomes reddened/irritated stop using the CHG.  Do not shave (including legs and underarms) for at least 48 hours prior to first CHG shower. It is OK to shave your face.  Please follow these instructions carefully.   1. Shower the NIGHT BEFORE SURGERY and the MORNING OF SURGERY with CHG Soap.   2. If you chose to wash your hair, wash your hair first as usual with your normal shampoo.  3. After you shampoo, rinse your hair and body thoroughly to remove the shampoo.  4. Use CHG as you would any other liquid soap. You can apply CHG directly to the skin and wash  gently with a scrungie or a clean washcloth.   5. Apply the CHG Soap to your body ONLY FROM THE NECK DOWN.  Do not use on open wounds or open sores. Avoid contact with your eyes, ears, mouth and genitals (private parts). Wash Face and genitals (private parts)  with your normal soap.   6. Wash thoroughly, paying special attention to the area where your surgery will be performed.  7. Thoroughly rinse your body with warm water from the neck down.  8. DO NOT shower/wash with your normal soap after using and rinsing off the CHG Soap.  9. Pat yourself dry with a CLEAN TOWEL.  10. Wear CLEAN PAJAMAS to bed the night before surgery  11. Place CLEAN SHEETS on your bed the night of your first shower and DO NOT SLEEP WITH PETS.   Day of Surgery: Wear Clean/Comfortable clothing the  morning of surgery Do not apply any deodorants/lotions.   Remember to brush your teeth WITH YOUR REGULAR TOOTHPASTE.   Please read over the following fact sheets that you were given.

## 2020-06-26 ENCOUNTER — Encounter (HOSPITAL_COMMUNITY): Payer: Self-pay

## 2020-06-26 ENCOUNTER — Encounter (HOSPITAL_COMMUNITY)
Admission: RE | Admit: 2020-06-26 | Discharge: 2020-06-26 | Disposition: A | Discharge status: Home / Self Care | Payer: PPO | Source: Ambulatory Visit | Attending: Neurological Surgery | Admitting: Neurological Surgery

## 2020-06-26 ENCOUNTER — Other Ambulatory Visit (HOSPITAL_COMMUNITY)
Admission: RE | Admit: 2020-06-26 | Discharge: 2020-06-26 | Disposition: A | Discharge status: Home / Self Care | Payer: PPO | Source: Ambulatory Visit | Attending: Neurological Surgery | Admitting: Neurological Surgery

## 2020-06-26 ENCOUNTER — Other Ambulatory Visit: Payer: Self-pay

## 2020-06-26 DIAGNOSIS — R1031 Right lower quadrant pain: Secondary | ICD-10-CM | POA: Diagnosis not present

## 2020-06-26 DIAGNOSIS — M4714 Other spondylosis with myelopathy, thoracic region: Secondary | ICD-10-CM | POA: Diagnosis present

## 2020-06-26 DIAGNOSIS — M545 Low back pain, unspecified: Secondary | ICD-10-CM | POA: Diagnosis not present

## 2020-06-26 DIAGNOSIS — Z9889 Other specified postprocedural states: Secondary | ICD-10-CM | POA: Diagnosis not present

## 2020-06-26 DIAGNOSIS — M4716 Other spondylosis with myelopathy, lumbar region: Secondary | ICD-10-CM | POA: Diagnosis present

## 2020-06-26 DIAGNOSIS — G8929 Other chronic pain: Secondary | ICD-10-CM | POA: Diagnosis present

## 2020-06-26 DIAGNOSIS — M4326 Fusion of spine, lumbar region: Secondary | ICD-10-CM | POA: Diagnosis not present

## 2020-06-26 DIAGNOSIS — M4715 Other spondylosis with myelopathy, thoracolumbar region: Secondary | ICD-10-CM | POA: Diagnosis not present

## 2020-06-26 DIAGNOSIS — M47895 Other spondylosis, thoracolumbar region: Secondary | ICD-10-CM | POA: Diagnosis present

## 2020-06-26 DIAGNOSIS — Y838 Other surgical procedures as the cause of abnormal reaction of the patient, or of later complication, without mention of misadventure at the time of the procedure: Secondary | ICD-10-CM | POA: Diagnosis not present

## 2020-06-26 DIAGNOSIS — M797 Fibromyalgia: Secondary | ICD-10-CM | POA: Diagnosis present

## 2020-06-26 DIAGNOSIS — F419 Anxiety disorder, unspecified: Secondary | ICD-10-CM | POA: Diagnosis present

## 2020-06-26 DIAGNOSIS — M1990 Unspecified osteoarthritis, unspecified site: Secondary | ICD-10-CM | POA: Diagnosis present

## 2020-06-26 DIAGNOSIS — R109 Unspecified abdominal pain: Secondary | ICD-10-CM | POA: Diagnosis not present

## 2020-06-26 DIAGNOSIS — Z881 Allergy status to other antibiotic agents status: Secondary | ICD-10-CM | POA: Diagnosis not present

## 2020-06-26 DIAGNOSIS — Z01812 Encounter for preprocedural laboratory examination: Secondary | ICD-10-CM | POA: Insufficient documentation

## 2020-06-26 DIAGNOSIS — Z981 Arthrodesis status: Secondary | ICD-10-CM | POA: Diagnosis not present

## 2020-06-26 DIAGNOSIS — K913 Postprocedural intestinal obstruction, unspecified as to partial versus complete: Secondary | ICD-10-CM | POA: Diagnosis not present

## 2020-06-26 DIAGNOSIS — M4804 Spinal stenosis, thoracic region: Secondary | ICD-10-CM | POA: Diagnosis present

## 2020-06-26 DIAGNOSIS — J439 Emphysema, unspecified: Secondary | ICD-10-CM | POA: Diagnosis not present

## 2020-06-26 DIAGNOSIS — E039 Hypothyroidism, unspecified: Secondary | ICD-10-CM | POA: Diagnosis present

## 2020-06-26 DIAGNOSIS — M4186 Other forms of scoliosis, lumbar region: Secondary | ICD-10-CM | POA: Diagnosis present

## 2020-06-26 DIAGNOSIS — Z887 Allergy status to serum and vaccine status: Secondary | ICD-10-CM | POA: Diagnosis not present

## 2020-06-26 DIAGNOSIS — K219 Gastro-esophageal reflux disease without esophagitis: Secondary | ICD-10-CM | POA: Diagnosis present

## 2020-06-26 DIAGNOSIS — Z01818 Encounter for other preprocedural examination: Secondary | ICD-10-CM | POA: Insufficient documentation

## 2020-06-26 DIAGNOSIS — G992 Myelopathy in diseases classified elsewhere: Secondary | ICD-10-CM | POA: Diagnosis not present

## 2020-06-26 DIAGNOSIS — J9811 Atelectasis: Secondary | ICD-10-CM | POA: Diagnosis not present

## 2020-06-26 DIAGNOSIS — M48062 Spinal stenosis, lumbar region with neurogenic claudication: Secondary | ICD-10-CM | POA: Diagnosis present

## 2020-06-26 DIAGNOSIS — Z888 Allergy status to other drugs, medicaments and biological substances status: Secondary | ICD-10-CM | POA: Diagnosis not present

## 2020-06-26 DIAGNOSIS — M48061 Spinal stenosis, lumbar region without neurogenic claudication: Secondary | ICD-10-CM | POA: Diagnosis not present

## 2020-06-26 DIAGNOSIS — Z20822 Contact with and (suspected) exposure to covid-19: Secondary | ICD-10-CM | POA: Diagnosis present

## 2020-06-26 DIAGNOSIS — G9611 Dural tear: Secondary | ICD-10-CM | POA: Diagnosis present

## 2020-06-26 DIAGNOSIS — M4185 Other forms of scoliosis, thoracolumbar region: Secondary | ICD-10-CM | POA: Diagnosis not present

## 2020-06-26 DIAGNOSIS — M4854XA Collapsed vertebra, not elsewhere classified, thoracic region, initial encounter for fracture: Secondary | ICD-10-CM | POA: Diagnosis not present

## 2020-06-26 DIAGNOSIS — M533 Sacrococcygeal disorders, not elsewhere classified: Secondary | ICD-10-CM | POA: Diagnosis not present

## 2020-06-26 DIAGNOSIS — Z96653 Presence of artificial knee joint, bilateral: Secondary | ICD-10-CM | POA: Diagnosis present

## 2020-06-26 DIAGNOSIS — M4855XA Collapsed vertebra, not elsewhere classified, thoracolumbar region, initial encounter for fracture: Secondary | ICD-10-CM | POA: Diagnosis not present

## 2020-06-26 DIAGNOSIS — Z4682 Encounter for fitting and adjustment of non-vascular catheter: Secondary | ICD-10-CM | POA: Diagnosis not present

## 2020-06-26 DIAGNOSIS — M4856XA Collapsed vertebra, not elsewhere classified, lumbar region, initial encounter for fracture: Secondary | ICD-10-CM | POA: Diagnosis not present

## 2020-06-26 DIAGNOSIS — M4805 Spinal stenosis, thoracolumbar region: Secondary | ICD-10-CM | POA: Diagnosis not present

## 2020-06-26 DIAGNOSIS — I10 Essential (primary) hypertension: Secondary | ICD-10-CM | POA: Diagnosis present

## 2020-06-26 DIAGNOSIS — G2581 Restless legs syndrome: Secondary | ICD-10-CM | POA: Diagnosis present

## 2020-06-26 DIAGNOSIS — J939 Pneumothorax, unspecified: Secondary | ICD-10-CM | POA: Diagnosis not present

## 2020-06-26 DIAGNOSIS — M25551 Pain in right hip: Secondary | ICD-10-CM | POA: Diagnosis not present

## 2020-06-26 DIAGNOSIS — J95811 Postprocedural pneumothorax: Secondary | ICD-10-CM | POA: Diagnosis not present

## 2020-06-26 LAB — TYPE AND SCREEN
ABO/RH(D): O POS
Antibody Screen: NEGATIVE

## 2020-06-26 LAB — CBC
HCT: 40.5 % (ref 36.0–46.0)
Hemoglobin: 13.2 g/dL (ref 12.0–15.0)
MCH: 31.4 pg (ref 26.0–34.0)
MCHC: 32.6 g/dL (ref 30.0–36.0)
MCV: 96.2 fL (ref 80.0–100.0)
Platelets: 328 10*3/uL (ref 150–400)
RBC: 4.21 MIL/uL (ref 3.87–5.11)
RDW: 13.5 % (ref 11.5–15.5)
WBC: 5.8 10*3/uL (ref 4.0–10.5)
nRBC: 0 % (ref 0.0–0.2)

## 2020-06-26 LAB — BASIC METABOLIC PANEL
Anion gap: 8 (ref 5–15)
BUN: 12 mg/dL (ref 8–23)
CO2: 28 mmol/L (ref 22–32)
Calcium: 10 mg/dL (ref 8.9–10.3)
Chloride: 102 mmol/L (ref 98–111)
Creatinine, Ser: 0.89 mg/dL (ref 0.44–1.00)
GFR, Estimated: >60 mL/min (ref >60)
Glucose, Bld: 97 mg/dL (ref 70–99)
Potassium: 4 mmol/L (ref 3.5–5.1)
Sodium: 138 mmol/L (ref 135–145)

## 2020-06-26 LAB — SURGICAL PCR SCREEN
MRSA, PCR: NEGATIVE
Staphylococcus aureus: NEGATIVE

## 2020-06-26 NOTE — Progress Notes (Signed)
PCP - Dr. Nicholos Johns Cardiologist - denies  Chest x-ray - N/A EKG - last month at PCP- requested Stress Test - denies ECHO - last month at PCP- requested Cardiac Cath - denies  Sleep Study - denies  Aspirin Instructions: Patient instructed to hold all Aspirin, NSAID's, herbal medications, fish oil and vitamins as of today  Covid test: 06/26/20 at at [facility]. Pt instructed to remain in their car. Educated on Haematologist until [employer]  Anesthesia review: records requested  Patient denies shortness of breath, fever, cough and chest pain at PAT appointment   Patient verbalized understanding of instructions that were given to them at the PAT appointment. Patient was also instructed that they will need to review over the PAT instructions again at home before surgery.

## 2020-06-27 ENCOUNTER — Encounter (HOSPITAL_COMMUNITY): Payer: Self-pay | Admitting: Neurological Surgery

## 2020-06-27 LAB — SARS CORONAVIRUS 2 (TAT 6-24 HRS): SARS Coronavirus 2: NEGATIVE

## 2020-06-27 NOTE — Anesthesia Preprocedure Evaluation (Addendum)
Anesthesia Evaluation  Patient identified by MRN, date of birth, ID band Patient awake    Reviewed: Allergy & Precautions, NPO status , Patient's Chart, lab work & pertinent test results  Airway Mallampati: II  TM Distance: >3 FB Neck ROM: Full    Dental no notable dental hx. (+) Teeth Intact, Dental Advisory Given   Pulmonary neg pulmonary ROS,    Pulmonary exam normal breath sounds clear to auscultation       Cardiovascular hypertension, Pt. on medications Normal cardiovascular exam+ Valvular Problems/Murmurs (mild AS) AS  Rhythm:Regular Rate:Normal  echo 05/03/2020 Left ventricle: LV chamber and wall dimensions are normal.  LV chamber size is normal.  LV wall thickness is normal.  LV systolic function is normal.  There are no wall motion abnormalities observed.  The calculated LV ejection fraction is 64.22%.  There is impaired left ventricular relaxation. Right ventricle: There is normal right ventricular size, wall dimension, and systolic function. Left atrium: The left atrium chamber is mildly dilated. Right atrium: RA chamber size is normal. Aortic valve: There is a trileaflet aortic valve.  The aortic valve is mildly calcified.  There is no aortic regurgitation.  There is mild aortic valve stenosis (mean gradient 13.32 mmHg). Mitral valve: There is normal mitral valve structure and function.  There is no mitral valve stenosis or regurgitation. Tricuspid valve: There is normal tricuspid valve structure and function.  There is trace tricuspid regurgitation. Pulmonary valve: There is normal pulmonic valve structure and function.  There is no pulmonic valve stenosis or regurgitation. Pericardium: The pericardium is normal. Aorta: The size of the visualized portion of the aortic root is within normal limits. Venous: The inferior vena cava dimension is normal. Recommendation to repeat echo in 3 years for mild AS.  EKG 05/31/2020:  Sinus rhythm.  Rate 82.  Low voltage-possible pulmonary disease.    Neuro/Psych PSYCHIATRIC DISORDERS Anxiety negative neurological ROS     GI/Hepatic Neg liver ROS, GERD  Medicated and Controlled,  Endo/Other  Hypothyroidism   Renal/GU negative Renal ROS  negative genitourinary   Musculoskeletal  (+) Arthritis , Fibromyalgia -  Abdominal   Peds  Hematology negative hematology ROS (+)   Anesthesia Other Findings   Reproductive/Obstetrics                           Anesthesia Physical Anesthesia Plan  ASA: III  Anesthesia Plan: General   Post-op Pain Management:    Induction: Intravenous  PONV Risk Score and Plan: 3 and Midazolam, Dexamethasone and Ondansetron  Airway Management Planned: Oral ETT  Additional Equipment: Arterial line  Intra-op Plan:   Post-operative Plan: Extubation in OR  Informed Consent: I have reviewed the patients History and Physical, chart, labs and discussed the procedure including the risks, benefits and alternatives for the proposed anesthesia with the patient or authorized representative who has indicated his/her understanding and acceptance.     Dental advisory given  Plan Discussed with: CRNA  Anesthesia Plan Comments: (2 IVs)      Anesthesia Quick Evaluation

## 2020-06-28 ENCOUNTER — Inpatient Hospital Stay (HOSPITAL_COMMUNITY): Payer: PPO

## 2020-06-28 ENCOUNTER — Inpatient Hospital Stay (HOSPITAL_COMMUNITY)
Admission: RE | Admit: 2020-06-28 | Discharge: 2020-07-13 | DRG: 454 | Disposition: A | Discharge status: Home Health | Payer: PPO | Attending: Neurological Surgery | Admitting: Neurological Surgery

## 2020-06-28 ENCOUNTER — Inpatient Hospital Stay (HOSPITAL_COMMUNITY)
Admission: RE | Disposition: A | Discharge status: Home Health | Payer: Self-pay | Source: Home / Self Care | Attending: Neurological Surgery

## 2020-06-28 ENCOUNTER — Other Ambulatory Visit: Payer: Self-pay

## 2020-06-28 ENCOUNTER — Inpatient Hospital Stay (HOSPITAL_COMMUNITY): Payer: PPO | Admitting: Physician Assistant

## 2020-06-28 ENCOUNTER — Encounter (HOSPITAL_COMMUNITY): Payer: Self-pay | Admitting: Neurological Surgery

## 2020-06-28 ENCOUNTER — Inpatient Hospital Stay (HOSPITAL_COMMUNITY): Payer: PPO | Admitting: Certified Registered Nurse Anesthetist

## 2020-06-28 DIAGNOSIS — Z20822 Contact with and (suspected) exposure to covid-19: Secondary | ICD-10-CM | POA: Diagnosis present

## 2020-06-28 DIAGNOSIS — J9811 Atelectasis: Secondary | ICD-10-CM | POA: Diagnosis not present

## 2020-06-28 DIAGNOSIS — K913 Postprocedural intestinal obstruction, unspecified as to partial versus complete: Secondary | ICD-10-CM | POA: Diagnosis not present

## 2020-06-28 DIAGNOSIS — M4716 Other spondylosis with myelopathy, lumbar region: Secondary | ICD-10-CM | POA: Diagnosis present

## 2020-06-28 DIAGNOSIS — Z888 Allergy status to other drugs, medicaments and biological substances status: Secondary | ICD-10-CM

## 2020-06-28 DIAGNOSIS — M4714 Other spondylosis with myelopathy, thoracic region: Principal | ICD-10-CM | POA: Diagnosis present

## 2020-06-28 DIAGNOSIS — M47895 Other spondylosis, thoracolumbar region: Secondary | ICD-10-CM | POA: Diagnosis present

## 2020-06-28 DIAGNOSIS — M4326 Fusion of spine, lumbar region: Secondary | ICD-10-CM | POA: Diagnosis not present

## 2020-06-28 DIAGNOSIS — M069 Rheumatoid arthritis, unspecified: Secondary | ICD-10-CM | POA: Diagnosis present

## 2020-06-28 DIAGNOSIS — F419 Anxiety disorder, unspecified: Secondary | ICD-10-CM | POA: Diagnosis present

## 2020-06-28 DIAGNOSIS — J939 Pneumothorax, unspecified: Secondary | ICD-10-CM | POA: Diagnosis not present

## 2020-06-28 DIAGNOSIS — M1990 Unspecified osteoarthritis, unspecified site: Secondary | ICD-10-CM | POA: Diagnosis present

## 2020-06-28 DIAGNOSIS — R197 Diarrhea, unspecified: Secondary | ICD-10-CM | POA: Diagnosis not present

## 2020-06-28 DIAGNOSIS — Z96653 Presence of artificial knee joint, bilateral: Secondary | ICD-10-CM | POA: Diagnosis present

## 2020-06-28 DIAGNOSIS — G9611 Dural tear: Secondary | ICD-10-CM | POA: Diagnosis present

## 2020-06-28 DIAGNOSIS — K219 Gastro-esophageal reflux disease without esophagitis: Secondary | ICD-10-CM | POA: Diagnosis present

## 2020-06-28 DIAGNOSIS — Z887 Allergy status to serum and vaccine status: Secondary | ICD-10-CM | POA: Diagnosis not present

## 2020-06-28 DIAGNOSIS — Z79890 Hormone replacement therapy (postmenopausal): Secondary | ICD-10-CM

## 2020-06-28 DIAGNOSIS — M4186 Other forms of scoliosis, lumbar region: Secondary | ICD-10-CM | POA: Diagnosis present

## 2020-06-28 DIAGNOSIS — R339 Retention of urine, unspecified: Secondary | ICD-10-CM | POA: Diagnosis not present

## 2020-06-28 DIAGNOSIS — M48062 Spinal stenosis, lumbar region with neurogenic claudication: Secondary | ICD-10-CM | POA: Diagnosis present

## 2020-06-28 DIAGNOSIS — Z419 Encounter for procedure for purposes other than remedying health state, unspecified: Secondary | ICD-10-CM

## 2020-06-28 DIAGNOSIS — E039 Hypothyroidism, unspecified: Secondary | ICD-10-CM | POA: Diagnosis present

## 2020-06-28 DIAGNOSIS — I10 Essential (primary) hypertension: Secondary | ICD-10-CM | POA: Diagnosis present

## 2020-06-28 DIAGNOSIS — Z881 Allergy status to other antibiotic agents status: Secondary | ICD-10-CM

## 2020-06-28 DIAGNOSIS — Z6835 Body mass index (BMI) 35.0-35.9, adult: Secondary | ICD-10-CM

## 2020-06-28 DIAGNOSIS — M25551 Pain in right hip: Secondary | ICD-10-CM | POA: Diagnosis not present

## 2020-06-28 DIAGNOSIS — Z4682 Encounter for fitting and adjustment of non-vascular catheter: Secondary | ICD-10-CM

## 2020-06-28 DIAGNOSIS — Y838 Other surgical procedures as the cause of abnormal reaction of the patient, or of later complication, without mention of misadventure at the time of the procedure: Secondary | ICD-10-CM | POA: Diagnosis not present

## 2020-06-28 DIAGNOSIS — E669 Obesity, unspecified: Secondary | ICD-10-CM | POA: Diagnosis present

## 2020-06-28 DIAGNOSIS — M797 Fibromyalgia: Secondary | ICD-10-CM | POA: Diagnosis present

## 2020-06-28 DIAGNOSIS — Z981 Arthrodesis status: Secondary | ICD-10-CM

## 2020-06-28 DIAGNOSIS — M4804 Spinal stenosis, thoracic region: Secondary | ICD-10-CM | POA: Diagnosis present

## 2020-06-28 DIAGNOSIS — R109 Unspecified abdominal pain: Secondary | ICD-10-CM

## 2020-06-28 DIAGNOSIS — J95811 Postprocedural pneumothorax: Secondary | ICD-10-CM | POA: Diagnosis not present

## 2020-06-28 DIAGNOSIS — R1031 Right lower quadrant pain: Secondary | ICD-10-CM | POA: Diagnosis not present

## 2020-06-28 DIAGNOSIS — Z9889 Other specified postprocedural states: Secondary | ICD-10-CM | POA: Diagnosis not present

## 2020-06-28 DIAGNOSIS — G8929 Other chronic pain: Secondary | ICD-10-CM | POA: Diagnosis present

## 2020-06-28 DIAGNOSIS — Z79899 Other long term (current) drug therapy: Secondary | ICD-10-CM

## 2020-06-28 DIAGNOSIS — G2581 Restless legs syndrome: Secondary | ICD-10-CM | POA: Diagnosis present

## 2020-06-28 HISTORY — PX: ANTERIOR LAT LUMBAR FUSION: SHX1168

## 2020-06-28 HISTORY — PX: APPLICATION OF ROBOTIC ASSISTANCE FOR SPINAL PROCEDURE: SHX6753

## 2020-06-28 HISTORY — PX: LUMBAR PERCUTANEOUS PEDICLE SCREW 4 LEVEL: SHX6318

## 2020-06-28 LAB — POCT I-STAT 7, (LYTES, BLD GAS, ICA,H+H)
Acid-Base Excess: 1 mmol/L (ref 0.0–2.0)
Acid-Base Excess: 0 mmol/L (ref 0.0–2.0)
Acid-Base Excess: 1 mmol/L (ref 0.0–2.0)
Bicarbonate: 25.7 mmol/L (ref 20.0–28.0)
Bicarbonate: 24.6 mmol/L (ref 20.0–28.0)
Bicarbonate: 25.4 mmol/L (ref 20.0–28.0)
Calcium, Ion: 1.04 mmol/L — ABNORMAL LOW (ref 1.15–1.40)
Calcium, Ion: 1.03 mmol/L — ABNORMAL LOW (ref 1.15–1.40)
Calcium, Ion: 1.04 mmol/L — ABNORMAL LOW (ref 1.15–1.40)
HCT: 34 % — ABNORMAL LOW (ref 36.0–46.0)
HCT: 33 % — ABNORMAL LOW (ref 36.0–46.0)
HCT: 30 % — ABNORMAL LOW (ref 36.0–46.0)
Hemoglobin: 11.6 g/dL — ABNORMAL LOW (ref 12.0–15.0)
Hemoglobin: 11.2 g/dL — ABNORMAL LOW (ref 12.0–15.0)
Hemoglobin: 10.2 g/dL — ABNORMAL LOW (ref 12.0–15.0)
O2 Saturation: 100 %
O2 Saturation: 100 %
O2 Saturation: 100 %
Potassium: 3.7 mmol/L (ref 3.5–5.1)
Potassium: 3.7 mmol/L (ref 3.5–5.1)
Potassium: 3.9 mmol/L (ref 3.5–5.1)
Sodium: 138 mmol/L (ref 135–145)
Sodium: 139 mmol/L (ref 135–145)
Sodium: 139 mmol/L (ref 135–145)
TCO2: 27 mmol/L (ref 22–32)
TCO2: 26 mmol/L (ref 22–32)
TCO2: 27 mmol/L (ref 22–32)
pCO2 arterial: 39.4 mmHg (ref 32.0–48.0)
pCO2 arterial: 38.8 mmHg (ref 32.0–48.0)
pCO2 arterial: 39.7 mmHg (ref 32.0–48.0)
pH, Arterial: 7.422 (ref 7.350–7.450)
pH, Arterial: 7.41 (ref 7.350–7.450)
pH, Arterial: 7.413 (ref 7.350–7.450)
pO2, Arterial: 235 mmHg — ABNORMAL HIGH (ref 83.0–108.0)
pO2, Arterial: 253 mmHg — ABNORMAL HIGH (ref 83.0–108.0)
pO2, Arterial: 264 mmHg — ABNORMAL HIGH (ref 83.0–108.0)

## 2020-06-28 SURGERY — ANTERIOR LATERAL LUMBAR FUSION 3 LEVELS
Anesthesia: General | Site: Spine Lumbar

## 2020-06-28 MED ORDER — LIDOCAINE 2% (20 MG/ML) 5 ML SYRINGE
INTRAMUSCULAR | Status: DC | PRN
  Administered 2020-06-28: 80 mg via INTRAVENOUS

## 2020-06-28 MED ORDER — SUCCINYLCHOLINE CHLORIDE 200 MG/10ML IV SOSY
PREFILLED_SYRINGE | INTRAVENOUS | Status: AC
  Filled 2020-06-28: qty 10

## 2020-06-28 MED ORDER — MORPHINE SULFATE (PF) 2 MG/ML IV SOLN
2.0000 mg | INTRAVENOUS | Status: DC | PRN
  Administered 2020-06-30: 2 mg via INTRAVENOUS
  Administered 2020-06-30: 4 mg via INTRAVENOUS
  Administered 2020-06-30 (×2): 2 mg via INTRAVENOUS
  Administered 2020-07-01 (×2): 4 mg via INTRAVENOUS
  Administered 2020-07-01: 2 mg via INTRAVENOUS
  Administered 2020-07-01: 4 mg via INTRAVENOUS
  Administered 2020-07-02: 2 mg via INTRAVENOUS
  Administered 2020-07-02 (×5): 4 mg via INTRAVENOUS
  Administered 2020-07-04: 2 mg via INTRAVENOUS
  Filled 2020-06-28 (×2): qty 1
  Filled 2020-06-28 (×3): qty 2
  Filled 2020-06-28: qty 1
  Filled 2020-06-28: qty 2
  Filled 2020-06-28: qty 1
  Filled 2020-06-28 (×4): qty 2
  Filled 2020-06-28 (×2): qty 1
  Filled 2020-06-28: qty 2

## 2020-06-28 MED ORDER — SODIUM CHLORIDE 0.9 % IV SOLN: 250.0000 mL | INTRAVENOUS | Status: DC

## 2020-06-28 MED ORDER — ONDANSETRON HCL 4 MG/2ML IJ SOLN
4.0000 mg | Freq: Four times a day (QID) | INTRAMUSCULAR | Status: DC | PRN
  Administered 2020-07-02 – 2020-07-11 (×7): 4 mg via INTRAVENOUS
  Filled 2020-06-28 (×7): qty 2

## 2020-06-28 MED ORDER — LEFLUNOMIDE 20 MG PO TABS
20.0000 mg | ORAL_TABLET | Freq: Every day | ORAL | Status: DC
  Administered 2020-06-28 – 2020-07-12 (×15): 20 mg via ORAL
  Filled 2020-06-28 (×16): qty 1

## 2020-06-28 MED ORDER — FENTANYL CITRATE (PF) 100 MCG/2ML IJ SOLN
INTRAMUSCULAR | Status: AC
  Filled 2020-06-28: qty 2

## 2020-06-28 MED ORDER — ALPRAZOLAM 0.25 MG PO TABS
0.2500 mg | ORAL_TABLET | Freq: Two times a day (BID) | ORAL | Status: DC | PRN
  Administered 2020-06-30 – 2020-07-09 (×11): 0.25 mg via ORAL
  Filled 2020-06-28 (×13): qty 1

## 2020-06-28 MED ORDER — CEFAZOLIN SODIUM-DEXTROSE 2-4 GM/100ML-% IV SOLN
2.0000 g | Freq: Three times a day (TID) | INTRAVENOUS | Status: AC
  Administered 2020-06-28 – 2020-06-29 (×2): 2 g via INTRAVENOUS
  Filled 2020-06-28 (×2): qty 100

## 2020-06-28 MED ORDER — LACTATED RINGERS IV SOLN: INTRAVENOUS | Status: DC

## 2020-06-28 MED ORDER — LIDOCAINE-EPINEPHRINE 1 %-1:100000 IJ SOLN
INTRAMUSCULAR | Status: AC
  Filled 2020-06-28: qty 1

## 2020-06-28 MED ORDER — LIDOCAINE 2% (20 MG/ML) 5 ML SYRINGE
INTRAMUSCULAR | Status: AC
  Filled 2020-06-28: qty 5

## 2020-06-28 MED ORDER — BUPROPION HCL ER (XL) 300 MG PO TB24
450.0000 mg | ORAL_TABLET | Freq: Every day | ORAL | Status: DC
  Administered 2020-06-29 – 2020-07-13 (×14): 450 mg via ORAL
  Filled 2020-06-28 (×5): qty 1
  Filled 2020-06-28: qty 3
  Filled 2020-06-28: qty 1
  Filled 2020-06-28: qty 3
  Filled 2020-06-28 (×2): qty 1
  Filled 2020-06-28: qty 3
  Filled 2020-06-28 (×4): qty 1

## 2020-06-28 MED ORDER — SODIUM CHLORIDE 0.9% FLUSH
3.0000 mL | INTRAVENOUS | Status: DC | PRN
  Administered 2020-07-01 – 2020-07-02 (×3): 3 mL via INTRAVENOUS

## 2020-06-28 MED ORDER — ACETAMINOPHEN 10 MG/ML IV SOLN
INTRAVENOUS | Status: AC
  Filled 2020-06-28: qty 100

## 2020-06-28 MED ORDER — ALBUMIN HUMAN 5 % IV SOLN: INTRAVENOUS | Status: DC | PRN

## 2020-06-28 MED ORDER — MENTHOL 3 MG MT LOZG: 1.0000 | LOZENGE | OROMUCOSAL | Status: DC | PRN

## 2020-06-28 MED ORDER — EPHEDRINE 5 MG/ML INJ
INTRAVENOUS | Status: AC
  Filled 2020-06-28: qty 10

## 2020-06-28 MED ORDER — LACTATED RINGERS IV SOLN: INTRAVENOUS | Status: DC | PRN

## 2020-06-28 MED ORDER — SUCCINYLCHOLINE CHLORIDE 200 MG/10ML IV SOSY
PREFILLED_SYRINGE | INTRAVENOUS | Status: DC | PRN
  Administered 2020-06-28: 160 mg via INTRAVENOUS

## 2020-06-28 MED ORDER — ACETAMINOPHEN 500 MG PO TABS
1000.0000 mg | ORAL_TABLET | Freq: Once | ORAL | Status: DC
  Filled 2020-06-28: qty 2

## 2020-06-28 MED ORDER — ORAL CARE MOUTH RINSE: 15.0000 mL | Freq: Once | OROMUCOSAL | Status: AC

## 2020-06-28 MED ORDER — FENTANYL CITRATE (PF) 100 MCG/2ML IJ SOLN
INTRAMUSCULAR | Status: DC | PRN
  Administered 2020-06-28 (×5): 50 ug via INTRAVENOUS

## 2020-06-28 MED ORDER — CHLORHEXIDINE GLUCONATE CLOTH 2 % EX PADS: 6.0000 | MEDICATED_PAD | Freq: Once | CUTANEOUS | Status: DC

## 2020-06-28 MED ORDER — CEFAZOLIN SODIUM-DEXTROSE 2-4 GM/100ML-% IV SOLN
2.0000 g | INTRAVENOUS | Status: AC
  Administered 2020-06-28 (×3): 2 g via INTRAVENOUS
  Filled 2020-06-28: qty 100

## 2020-06-28 MED ORDER — BUPIVACAINE HCL (PF) 0.5 % IJ SOLN
INTRAMUSCULAR | Status: DC | PRN
  Administered 2020-06-28: 12 mL
  Administered 2020-06-28: 30 mL

## 2020-06-28 MED ORDER — HYDROMORPHONE HCL 1 MG/ML IJ SOLN
INTRAMUSCULAR | Status: AC
  Filled 2020-06-28: qty 1

## 2020-06-28 MED ORDER — BUPIVACAINE HCL (PF) 0.5 % IJ SOLN
INTRAMUSCULAR | Status: AC
  Filled 2020-06-28: qty 30

## 2020-06-28 MED ORDER — CEFAZOLIN SODIUM 1 G IJ SOLR
INTRAMUSCULAR | Status: AC
  Filled 2020-06-28: qty 20

## 2020-06-28 MED ORDER — OXYCODONE-ACETAMINOPHEN 5-325 MG PO TABS
1.0000 | ORAL_TABLET | ORAL | Status: DC | PRN
  Administered 2020-06-28 – 2020-07-02 (×22): 2 via ORAL
  Administered 2020-07-03 (×3): 1 via ORAL
  Administered 2020-07-03 (×2): 2 via ORAL
  Administered 2020-07-03 – 2020-07-04 (×2): 1 via ORAL
  Filled 2020-06-28 (×5): qty 2
  Filled 2020-06-28 (×2): qty 1
  Filled 2020-06-28 (×4): qty 2
  Filled 2020-06-28 (×3): qty 1
  Filled 2020-06-28 (×8): qty 2
  Filled 2020-06-28: qty 1
  Filled 2020-06-28: qty 2
  Filled 2020-06-28: qty 1
  Filled 2020-06-28 (×5): qty 2

## 2020-06-28 MED ORDER — ONDANSETRON HCL 4 MG/2ML IJ SOLN
INTRAMUSCULAR | Status: AC
  Filled 2020-06-28: qty 2

## 2020-06-28 MED ORDER — ROCURONIUM BROMIDE 10 MG/ML (PF) SYRINGE
PREFILLED_SYRINGE | INTRAVENOUS | Status: DC | PRN
  Administered 2020-06-28: 10 mg via INTRAVENOUS

## 2020-06-28 MED ORDER — ONDANSETRON HCL 4 MG/2ML IJ SOLN
INTRAMUSCULAR | Status: DC | PRN
  Administered 2020-06-28 (×2): 4 mg via INTRAVENOUS

## 2020-06-28 MED ORDER — METHOCARBAMOL 500 MG PO TABS
500.0000 mg | ORAL_TABLET | Freq: Four times a day (QID) | ORAL | Status: DC | PRN
  Administered 2020-06-29 – 2020-07-13 (×38): 500 mg via ORAL
  Filled 2020-06-28 (×39): qty 1

## 2020-06-28 MED ORDER — METHOCARBAMOL 1000 MG/10ML IJ SOLN
500.0000 mg | Freq: Four times a day (QID) | INTRAVENOUS | Status: DC | PRN
  Filled 2020-06-28: qty 5

## 2020-06-28 MED ORDER — PHENYLEPHRINE 40 MCG/ML (10ML) SYRINGE FOR IV PUSH (FOR BLOOD PRESSURE SUPPORT)
PREFILLED_SYRINGE | INTRAVENOUS | Status: AC
  Filled 2020-06-28: qty 10

## 2020-06-28 MED ORDER — THROMBIN 5000 UNITS EX SOLR
CUTANEOUS | Status: AC
  Filled 2020-06-28: qty 5000

## 2020-06-28 MED ORDER — HYDROMORPHONE HCL 1 MG/ML IJ SOLN
0.2500 mg | INTRAMUSCULAR | Status: DC | PRN
  Administered 2020-06-28 (×4): 0.5 mg via INTRAVENOUS

## 2020-06-28 MED ORDER — DEXAMETHASONE SODIUM PHOSPHATE 10 MG/ML IJ SOLN
INTRAMUSCULAR | Status: DC | PRN
  Administered 2020-06-28: 10 mg via INTRAVENOUS

## 2020-06-28 MED ORDER — CHLORHEXIDINE GLUCONATE 0.12 % MT SOLN
15.0000 mL | Freq: Once | OROMUCOSAL | Status: AC
  Administered 2020-06-28: 15 mL via OROMUCOSAL
  Filled 2020-06-28: qty 15

## 2020-06-28 MED ORDER — SENNA 8.6 MG PO TABS
1.0000 | ORAL_TABLET | Freq: Two times a day (BID) | ORAL | Status: DC
  Administered 2020-06-28 – 2020-07-04 (×12): 8.6 mg via ORAL
  Filled 2020-06-28 (×13): qty 1

## 2020-06-28 MED ORDER — ROCURONIUM BROMIDE 10 MG/ML (PF) SYRINGE
PREFILLED_SYRINGE | INTRAVENOUS | Status: AC
  Filled 2020-06-28: qty 10

## 2020-06-28 MED ORDER — SODIUM CHLORIDE 0.9 % IV SOLN
0.0500 ug/kg/min | INTRAVENOUS | Status: DC
  Administered 2020-06-28: .15 ug/kg/min via INTRAVENOUS
  Filled 2020-06-28 (×3): qty 5000

## 2020-06-28 MED ORDER — MIDAZOLAM HCL 5 MG/5ML IJ SOLN
INTRAMUSCULAR | Status: DC | PRN
  Administered 2020-06-28: 2 mg via INTRAVENOUS

## 2020-06-28 MED ORDER — ONDANSETRON HCL 4 MG PO TABS
4.0000 mg | ORAL_TABLET | Freq: Four times a day (QID) | ORAL | Status: DC | PRN
  Administered 2020-07-04 – 2020-07-11 (×4): 4 mg via ORAL
  Filled 2020-06-28 (×5): qty 1

## 2020-06-28 MED ORDER — PHENYLEPHRINE HCL (PRESSORS) 10 MG/ML IV SOLN
INTRAVENOUS | Status: AC
  Filled 2020-06-28: qty 1

## 2020-06-28 MED ORDER — HYDROMORPHONE HCL 1 MG/ML IJ SOLN
INTRAMUSCULAR | Status: AC
  Filled 2020-06-28: qty 0.5

## 2020-06-28 MED ORDER — LIDOCAINE-EPINEPHRINE 1 %-1:100000 IJ SOLN
INTRAMUSCULAR | Status: DC | PRN
  Administered 2020-06-28: 12 mL

## 2020-06-28 MED ORDER — MIDAZOLAM HCL 2 MG/2ML IJ SOLN
INTRAMUSCULAR | Status: AC
  Filled 2020-06-28: qty 2

## 2020-06-28 MED ORDER — HYDROCHLOROTHIAZIDE 12.5 MG PO CAPS
12.5000 mg | ORAL_CAPSULE | Freq: Every day | ORAL | Status: DC
  Administered 2020-06-29 – 2020-07-13 (×14): 12.5 mg via ORAL
  Filled 2020-06-28 (×15): qty 1

## 2020-06-28 MED ORDER — 0.9 % SODIUM CHLORIDE (POUR BTL) OPTIME
TOPICAL | Status: DC | PRN
  Administered 2020-06-28: 1000 mL

## 2020-06-28 MED ORDER — SODIUM CHLORIDE 0.9% FLUSH
3.0000 mL | Freq: Two times a day (BID) | INTRAVENOUS | Status: DC
  Administered 2020-06-28 – 2020-07-12 (×28): 3 mL via INTRAVENOUS

## 2020-06-28 MED ORDER — THROMBIN 5000 UNITS EX SOLR
OROMUCOSAL | Status: DC | PRN
  Administered 2020-06-28 (×4): 5 mL via TOPICAL

## 2020-06-28 MED ORDER — LOSARTAN POTASSIUM 50 MG PO TABS
100.0000 mg | ORAL_TABLET | Freq: Every day | ORAL | Status: DC
  Administered 2020-06-29 – 2020-07-13 (×14): 100 mg via ORAL
  Filled 2020-06-28 (×14): qty 2

## 2020-06-28 MED ORDER — ACETAMINOPHEN ER 650 MG PO TBCR: 1300.0000 mg | EXTENDED_RELEASE_TABLET | Freq: Three times a day (TID) | ORAL | Status: DC | PRN

## 2020-06-28 MED ORDER — LEVOTHYROXINE SODIUM 200 MCG PO TABS: 200.0000 ug | ORAL_TABLET | ORAL | Status: DC

## 2020-06-28 MED ORDER — POLYETHYLENE GLYCOL 3350 17 G PO PACK
17.0000 g | PACK | Freq: Every day | ORAL | Status: DC | PRN
  Administered 2020-06-30 – 2020-07-03 (×2): 17 g via ORAL
  Filled 2020-06-28 (×2): qty 1

## 2020-06-28 MED ORDER — THROMBIN 20000 UNITS EX SOLR
CUTANEOUS | Status: DC | PRN
  Administered 2020-06-28: 20 mL via TOPICAL

## 2020-06-28 MED ORDER — KETAMINE HCL 10 MG/ML IJ SOLN
INTRAMUSCULAR | Status: DC | PRN
  Administered 2020-06-28 (×5): 10 mg via INTRAVENOUS

## 2020-06-28 MED ORDER — PROPOFOL 10 MG/ML IV BOLUS
INTRAVENOUS | Status: AC
  Filled 2020-06-28: qty 40

## 2020-06-28 MED ORDER — SODIUM CHLORIDE 0.9 % IV SOLN: INTRAVENOUS | Status: DC | PRN

## 2020-06-28 MED ORDER — ACETAMINOPHEN 10 MG/ML IV SOLN
INTRAVENOUS | Status: DC | PRN
  Administered 2020-06-28: 1000 mg via INTRAVENOUS

## 2020-06-28 MED ORDER — LEVOTHYROXINE SODIUM 100 MCG PO TABS
100.0000 ug | ORAL_TABLET | ORAL | Status: DC
  Administered 2020-06-30 – 2020-07-07 (×2): 100 ug via ORAL
  Filled 2020-06-28 (×10): qty 1

## 2020-06-28 MED ORDER — FENTANYL CITRATE (PF) 250 MCG/5ML IJ SOLN
INTRAMUSCULAR | Status: AC
  Filled 2020-06-28: qty 5

## 2020-06-28 MED ORDER — DEXAMETHASONE SODIUM PHOSPHATE 10 MG/ML IJ SOLN
INTRAMUSCULAR | Status: AC
  Filled 2020-06-28: qty 1

## 2020-06-28 MED ORDER — KETAMINE HCL 50 MG/5ML IJ SOSY
PREFILLED_SYRINGE | INTRAMUSCULAR | Status: AC
  Filled 2020-06-28: qty 5

## 2020-06-28 MED ORDER — BISACODYL 10 MG RE SUPP
10.0000 mg | Freq: Every day | RECTAL | Status: DC | PRN
  Administered 2020-07-03: 10 mg via RECTAL
  Filled 2020-06-28: qty 1

## 2020-06-28 MED ORDER — ACETAMINOPHEN 325 MG PO TABS
650.0000 mg | ORAL_TABLET | ORAL | Status: DC | PRN
  Administered 2020-07-03 – 2020-07-08 (×8): 650 mg via ORAL
  Filled 2020-06-28 (×8): qty 2

## 2020-06-28 MED ORDER — PROPOFOL 10 MG/ML IV BOLUS
INTRAVENOUS | Status: DC | PRN
  Administered 2020-06-28: 40 mg via INTRAVENOUS
  Administered 2020-06-28: 30 mg via INTRAVENOUS
  Administered 2020-06-28: 130 mg via INTRAVENOUS

## 2020-06-28 MED ORDER — FLEET ENEMA 7-19 GM/118ML RE ENEM: 1.0000 | ENEMA | Freq: Once | RECTAL | Status: DC | PRN

## 2020-06-28 MED ORDER — DOCUSATE SODIUM 100 MG PO CAPS
100.0000 mg | ORAL_CAPSULE | Freq: Two times a day (BID) | ORAL | Status: DC
  Administered 2020-06-28 – 2020-07-05 (×13): 100 mg via ORAL
  Filled 2020-06-28 (×14): qty 1

## 2020-06-28 MED ORDER — PRAMIPEXOLE DIHYDROCHLORIDE 0.25 MG PO TABS
0.1250 mg | ORAL_TABLET | Freq: Three times a day (TID) | ORAL | Status: DC
  Administered 2020-06-28 – 2020-07-13 (×44): 0.125 mg via ORAL
  Filled 2020-06-28 (×46): qty 1

## 2020-06-28 MED ORDER — LOSARTAN POTASSIUM-HCTZ 100-12.5 MG PO TABS: 1.0000 | ORAL_TABLET | Freq: Every morning | ORAL | Status: DC

## 2020-06-28 MED ORDER — HYDROMORPHONE HCL 1 MG/ML IJ SOLN
INTRAMUSCULAR | Status: DC | PRN
  Administered 2020-06-28 (×4): .25 mg via INTRAVENOUS

## 2020-06-28 MED ORDER — PANTOPRAZOLE SODIUM 40 MG PO TBEC
40.0000 mg | DELAYED_RELEASE_TABLET | Freq: Every day | ORAL | Status: DC
  Administered 2020-06-29 – 2020-07-04 (×6): 40 mg via ORAL
  Filled 2020-06-28 (×6): qty 1

## 2020-06-28 MED ORDER — SODIUM CHLORIDE 0.9 % IV SOLN
0.0125 ug/kg/min | INTRAVENOUS | Status: DC
  Administered 2020-06-28: .1 ug/kg/min via INTRAVENOUS
  Filled 2020-06-28 (×2): qty 2000

## 2020-06-28 MED ORDER — PROBIOTIC 250 MG PO CAPS: ORAL_CAPSULE | Freq: Every morning | ORAL | Status: DC

## 2020-06-28 MED ORDER — FENTANYL CITRATE (PF) 100 MCG/2ML IJ SOLN
25.0000 ug | INTRAMUSCULAR | Status: DC | PRN
  Administered 2020-06-28 (×2): 50 ug via INTRAVENOUS

## 2020-06-28 MED ORDER — PHENOL 1.4 % MT LIQD: 1.0000 | OROMUCOSAL | Status: DC | PRN

## 2020-06-28 MED ORDER — EPHEDRINE SULFATE 50 MG/ML IJ SOLN
INTRAMUSCULAR | Status: DC | PRN
  Administered 2020-06-28 (×5): 5 mg via INTRAVENOUS

## 2020-06-28 MED ORDER — PHENYLEPHRINE 40 MCG/ML (10ML) SYRINGE FOR IV PUSH (FOR BLOOD PRESSURE SUPPORT)
PREFILLED_SYRINGE | INTRAVENOUS | Status: DC | PRN
  Administered 2020-06-28 (×2): 80 ug via INTRAVENOUS
  Administered 2020-06-28: 120 ug via INTRAVENOUS
  Administered 2020-06-28: 40 ug via INTRAVENOUS
  Administered 2020-06-28: 120 ug via INTRAVENOUS

## 2020-06-28 MED ORDER — THROMBIN 20000 UNITS EX SOLR
CUTANEOUS | Status: AC
  Filled 2020-06-28: qty 20000

## 2020-06-28 MED ORDER — ACETAMINOPHEN 650 MG RE SUPP: 650.0000 mg | RECTAL | Status: DC | PRN

## 2020-06-28 MED ORDER — LEVOTHYROXINE SODIUM 100 MCG PO TABS
200.0000 ug | ORAL_TABLET | ORAL | Status: DC
  Administered 2020-06-29 – 2020-07-13 (×11): 200 ug via ORAL
  Filled 2020-06-28: qty 4
  Filled 2020-06-28 (×3): qty 2

## 2020-06-28 MED ORDER — PHENYLEPHRINE HCL-NACL 10-0.9 MG/250ML-% IV SOLN
INTRAVENOUS | Status: DC | PRN
  Administered 2020-06-28: 25 ug/min via INTRAVENOUS
  Administered 2020-06-28 (×2): 60 ug/min via INTRAVENOUS

## 2020-06-28 SURGICAL SUPPLY — 99 items
ADH SKN CLS APL DERMABOND .7 (GAUZE/BANDAGES/DRESSINGS) ×2
BASKET BONE COLLECTION (BASKET) ×2 IMPLANT
BIT DRILL LONG 3.0X30 (BIT) IMPLANT
BIT DRILL LONG 3X80 (BIT) IMPLANT
BIT DRILL LONG 4X80 (BIT) IMPLANT
BIT DRILL SHORT 3.0X30 (BIT) IMPLANT
BIT DRILL SHORT 3X80 (BIT) IMPLANT
BLADE CLIPPER SURG (BLADE) IMPLANT
BLADE SURG 11 STRL SS (BLADE) IMPLANT
BONE MATRIX OSTEOCEL PRO LRG (Bone Implant) ×2 IMPLANT
BONE MATRIX OSTEOCEL PRO MED (Bone Implant) ×2 IMPLANT
BUR MATCHSTICK NEURO 3.0 LAGG (BURR) ×2 IMPLANT
CABLE BIPOLOR RESECTION CORD (MISCELLANEOUS) ×2 IMPLANT
CEMENT KYPHON C01A KIT/MIXER (Cement) ×2 IMPLANT
CLIP NEUROVISION LG (CLIP) ×2 IMPLANT
CNTNR URN SCR LID CUP LEK RST (MISCELLANEOUS) ×1 IMPLANT
CONT SPEC 4OZ STRL OR WHT (MISCELLANEOUS) ×2
COVER BACK TABLE 60X90IN (DRAPES) ×2 IMPLANT
COVER WAND RF STERILE (DRAPES) IMPLANT
DERMABOND ADVANCED (GAUZE/BANDAGES/DRESSINGS) ×2
DERMABOND ADVANCED .7 DNX12 (GAUZE/BANDAGES/DRESSINGS) ×2 IMPLANT
DEVICE DISSECT PLASMABLAD 3.0S (MISCELLANEOUS) ×1 IMPLANT
DIGITIZER BENDINI (MISCELLANEOUS) ×2 IMPLANT
DRAPE C-ARM 42X72 X-RAY (DRAPES) ×4 IMPLANT
DRAPE C-ARMOR (DRAPES) ×4 IMPLANT
DRAPE LAPAROTOMY 100X72X124 (DRAPES) ×4 IMPLANT
DRAPE SHEET LG 3/4 BI-LAMINATE (DRAPES) ×2 IMPLANT
DRSG OPSITE POSTOP 4X10 (GAUZE/BANDAGES/DRESSINGS) ×2 IMPLANT
DURAPREP 26ML APPLICATOR (WOUND CARE) ×4 IMPLANT
ELECT BLADE 4.0 EZ CLEAN MEGAD (MISCELLANEOUS)
ELECT REM PT RETURN 9FT ADLT (ELECTROSURGICAL) ×4
ELECTRODE BLDE 4.0 EZ CLN MEGD (MISCELLANEOUS) IMPLANT
ELECTRODE REM PT RTRN 9FT ADLT (ELECTROSURGICAL) ×2 IMPLANT
GAUZE 4X4 16PLY RFD (DISPOSABLE) IMPLANT
GAUZE SPONGE 4X4 12PLY STRL LF (GAUZE/BANDAGES/DRESSINGS) ×2 IMPLANT
GAUZE SPONGE 4X4 16PLY XRAY LF (GAUZE/BANDAGES/DRESSINGS) ×4 IMPLANT
GLOVE BIO SURGEON STRL SZ7.5 (GLOVE) ×2 IMPLANT
GLOVE BIOGEL PI IND STRL 7.5 (GLOVE) ×1 IMPLANT
GLOVE BIOGEL PI IND STRL 8.5 (GLOVE) ×2 IMPLANT
GLOVE BIOGEL PI INDICATOR 7.5 (GLOVE) ×1
GLOVE BIOGEL PI INDICATOR 8.5 (GLOVE) ×2
GLOVE ECLIPSE 7.5 STRL STRAW (GLOVE) ×2 IMPLANT
GLOVE ECLIPSE 8.5 STRL (GLOVE) ×4 IMPLANT
GLOVE EXAM NITRILE XL STR (GLOVE) IMPLANT
GLOVE SURG SS PI 6.0 STRL IVOR (GLOVE) ×4 IMPLANT
GLOVE SURG SS PI 7.0 STRL IVOR (GLOVE) ×2 IMPLANT
GLOVE SURG SS PI 7.5 STRL IVOR (GLOVE) ×16 IMPLANT
GOWN STRL REUS W/ TWL LRG LVL3 (GOWN DISPOSABLE) ×2 IMPLANT
GOWN STRL REUS W/ TWL XL LVL3 (GOWN DISPOSABLE) ×3 IMPLANT
GOWN STRL REUS W/TWL 2XL LVL3 (GOWN DISPOSABLE) ×4 IMPLANT
GOWN STRL REUS W/TWL LRG LVL3 (GOWN DISPOSABLE) ×4
GOWN STRL REUS W/TWL XL LVL3 (GOWN DISPOSABLE) ×6
GRAFT DURAGEN MATRIX 1WX1L (Tissue) ×4 IMPLANT
GUIDEWIRE NITINOL BEVEL TIP (WIRE) ×20 IMPLANT
HEMOSTAT POWDER KIT SURGIFOAM (HEMOSTASIS) ×8 IMPLANT
KIT BASIN OR (CUSTOM PROCEDURE TRAY) ×4 IMPLANT
KIT DILATOR XLIF 5 (KITS) ×1 IMPLANT
KIT SPINE MAZOR X ROBO DISP (MISCELLANEOUS) ×2 IMPLANT
KIT SURGICAL ACCESS MAXCESS 4 (KITS) ×2 IMPLANT
KIT TURNOVER KIT B (KITS) ×2 IMPLANT
KIT XLIF (KITS) ×1
MARKER SKIN DUAL TIP RULER LAB (MISCELLANEOUS) ×2 IMPLANT
MILL MEDIUM DISP (BLADE) ×2 IMPLANT
MODULE NVM5 NEXT GEN EMG (NEEDLE) ×2 IMPLANT
MODULUS XLW 10X22X45MM 10 DEG (Cage) ×4 IMPLANT
NEEDLE HYPO 25X1 1.5 SAFETY (NEEDLE) ×4 IMPLANT
NEEDLE RELINE-OR FENS 16 (NEEDLE) ×12 IMPLANT
NS IRRIG 1000ML POUR BTL (IV SOLUTION) ×2 IMPLANT
PACK LAMINECTOMY NEURO (CUSTOM PROCEDURE TRAY) ×4 IMPLANT
PAD ARMBOARD 7.5X6 YLW CONV (MISCELLANEOUS) ×6 IMPLANT
PATTIES SURGICAL .5 X1 (DISPOSABLE) ×2 IMPLANT
PATTIES SURGICAL 1X1 (DISPOSABLE) ×2 IMPLANT
PIN HEAD 2.5X60MM (PIN) IMPLANT
PLASMABLADE 3.0S (MISCELLANEOUS) ×2
PUSHER RELINE FENS 36 OR (MISCELLANEOUS) ×12 IMPLANT
ROD RELINE-O 5.5X300 STRT NS (Rod) ×2 IMPLANT
ROD RELINE-O 5.5X300MM STRT (Rod) ×4 IMPLANT
SCREW LOCK RELINE 5.5 TULIP (Screw) ×20 IMPLANT
SCREW MAS RELINE 6.5X45 POLY (Screw) ×8 IMPLANT
SCREW RELINE PA 2FC 5.5X45 (Screw) ×4 IMPLANT
SCREW RELINE PA 6.5X45 (Screw) ×8 IMPLANT
SCREW SCHANZ SA 4.0MM (MISCELLANEOUS) IMPLANT
SPONGE LAP 4X18 RFD (DISPOSABLE) IMPLANT
SPONGE SURGIFOAM ABS GEL 100 (HEMOSTASIS) ×2 IMPLANT
SPONGE SURGIFOAM ABS GEL SZ50 (HEMOSTASIS) IMPLANT
SUT PROLENE 6 0 BV (SUTURE) ×6 IMPLANT
SUT VIC AB 1 CT1 18XBRD ANBCTR (SUTURE) ×2 IMPLANT
SUT VIC AB 1 CT1 8-18 (SUTURE) ×4
SUT VIC AB 2-0 CP2 18 (SUTURE) ×4 IMPLANT
SUT VIC AB 3-0 SH 8-18 (SUTURE) ×8 IMPLANT
SUT VIC AB 4-0 RB1 18 (SUTURE) ×6 IMPLANT
TAPE CLOTH 4X10 WHT NS (GAUZE/BANDAGES/DRESSINGS) IMPLANT
TAPE CLOTH SURG 4X10 WHT LF (GAUZE/BANDAGES/DRESSINGS) ×2 IMPLANT
TIP FENS REPLACE RELINE (MISCELLANEOUS) ×12 IMPLANT
TOWEL GREEN STERILE (TOWEL DISPOSABLE) ×4 IMPLANT
TOWEL GREEN STERILE FF (TOWEL DISPOSABLE) ×2 IMPLANT
TRAY FOLEY MTR SLVR 16FR STAT (SET/KITS/TRAYS/PACK) ×2 IMPLANT
TUBE MAZOR SA REDUCTION (TUBING) ×2 IMPLANT
WATER STERILE IRR 1000ML POUR (IV SOLUTION) ×4 IMPLANT

## 2020-06-28 NOTE — Plan of Care (Signed)

## 2020-06-28 NOTE — H&P (Signed)
[name] is an 65 y.o. female.   Chief Complaint: Back pain and leg pain HPI: [name] is a 65 year old individuals had a previous decompression fusion from L3 to the sacrum.  She had done well for a period of time but has developed significant chronic back pain and lower extremity pain and discomfort she has adjacent level disease that extends from T12-L1 to L2-L3.  She has developed progressive kyphosis in addition to significant stenosis.  After careful consideration of her options we discussed surgical decompression and stabilization from T10 to her previous hardware.  She is now admitted for that procedure.  Past Medical History:  Diagnosis Date  . Anxiety   . Arthritis   . Complication of anesthesia    during surgery she could hear staff talking and could feel them preping her arm over 30 yrs. ago  . Fibromyalgia   . GERD (gastroesophageal reflux disease)   . Hypertension   . Hypothyroidism   . Restless leg syndrome   . Staph infection    2015  . Wears glasses     Past Surgical History:  Procedure Laterality Date  . ABDOMINAL HYSTERECTOMY  1981   partial  . APPLICATION OF WOUND VAC N/A 04/25/2014   Procedure: APPLICATION OF WOUND VAC;  Surgeon: [name], MD;  Location: [facility];  Service: Neurosurgery;  Laterality: N/A;  . BACK SURGERY  2007  . BLADDER SUSPENSION     2004  . CERVICAL FUSION  1990  . CERVICAL FUSION     2000  . CYST EXCISION Left 1985   wrist  . CYST EXCISION  1985   chest  . FOOT SURGERY Right 2009   reconstruction with hardware  . FOOT SURGERY Left 2013   reconstruction with hardware  . LUMBAR FUSION  03/2014  . LUMBAR WOUND DEBRIDEMENT N/A 04/25/2014   Procedure: LUMBAR WOUND DEBRIDEMENT Placement of Woundvac);  Surgeon: [name], MD;  Location: [facility];  Service: Neurosurgery;  Laterality: N/A;  . PICC LINE PLACE PERIPHERAL ([facility] HX)     2015-infusion therapy secondary to STAPH infection   . TONSILLECTOMY     . TOTAL KNEE ARTHROPLASTY Bilateral 02/27/2015   Procedure: RIGHT TOTAL KNEE ARTHROPLASTY, LEFT KNEE CORTISONE INJECTION;  Surgeon: Durene Romans, MD;  Location: [facility];  Service: Orthopedics;  Laterality: Bilateral;  . TOTAL KNEE ARTHROPLASTY Left 06/19/2015   Procedure: LEFT TOTAL KNEE ARTHROPLASTY;  Surgeon: Durene Romans, MD;  Location: [facility];  Service: Orthopedics;  Laterality: Left;  . TUBAL LIGATION      History reviewed. No pertinent family history. Social History:  reports that she has never smoked. She has never used smokeless tobacco. She reports that she does not drink alcohol and does not use drugs.  Allergies:  Allergies  Allergen Reactions  . Prednisone Other (See Comments)    Chest pain  . Clindamycin/Lincomycin Rash  . Influenza Vaccines Hives  . Lodine [Etodolac] Rash  . Morphine And Related Other (See Comments)    "Doesn't work well"    Medications Prior to Admission  Medication Sig Dispense Refill  . acetaminophen (TYLENOL) 650 MG CR tablet Take 1,300 mg by mouth every 8 (eight) hours as needed for pain.    Marland Kitchen ALPRAZolam (XANAX) 0.25 MG tablet Take 0.25 mg by mouth 2 (two) times daily as needed for anxiety or sleep.    Marland Kitchen buPROPion (WELLBUTRIN XL) 150 MG 24 hr tablet Take 450 mg by mouth daily.    Marland Kitchen  esomeprazole (NEXIUM) 20 MG capsule Take 20 mg by mouth daily.     . hydroxychloroquine (PLAQUENIL) 200 MG tablet Take 200 mg by mouth 2 (two) times daily.    Marland Kitchen leflunomide (ARAVA) 20 MG tablet Take 20 mg by mouth at bedtime.    Marland Kitchen levothyroxine (SYNTHROID) 200 MCG tablet Take 200 mcg by mouth See admin instructions. Take 200 mcg daily except on Saturday take 100 mcg. Skip dose on Sundays.    Marland Kitchen losartan-hydrochlorothiazide (HYZAAR) 100-12.5 MG per tablet Take 1 tablet by mouth every morning.     . meloxicam (MOBIC) 15 MG tablet Take 15 mg by mouth 2 (two) times daily.    . pramipexole (MIRAPEX) 0.125 MG tablet Take 0.125 mg by mouth 3 (three) times daily.    . Probiotic  Product (PROBIOTIC PO) Take 1 tablet by mouth every morning.    . Vitamin D, Ergocalciferol, (DRISDOL) 1.25 MG (50000 UNIT) CAPS capsule Take 50,000 Units by mouth every Sunday.      Results for orders placed or performed during the hospital encounter of 06/26/20 (from the past 48 hour(s))  SARS CORONAVIRUS 2 (TAT 6-24 HRS) Nasopharyngeal Nasopharyngeal Swab     Status: None   Collection Time: 06/26/20  2:15 PM   Specimen: Nasopharyngeal Swab  Result Value Ref Range   SARS Coronavirus 2 NEGATIVE NEGATIVE    Comment: (NOTE) SARS-CoV-2 target nucleic acids are NOT DETECTED.  The SARS-CoV-2 RNA is generally detectable in upper and lower respiratory specimens during the acute phase of infection. Negative results do not preclude SARS-CoV-2 infection, do not rule out co-infections with other pathogens, and should not be used as the sole basis for treatment or other patient management decisions. Negative results must be combined with clinical observations, patient history, and epidemiological information. The expected result is Negative.  Fact Sheet for Patients: HairSlick.no  Fact Sheet for Healthcare Providers: quierodirigir.com  This test is not yet approved or cleared by the [place] FDA and  has been authorized for detection and/or diagnosis of SARS-CoV-2 by FDA under an Emergency Use Authorization (EUA). This EUA will remain  in effect (meaning this test can be used) for the duration of the COVID-19 declaration under Se ction 564(b)(1) of the Act, 21 U.S.C. section 360bbb-3(b)(1), unless the authorization is terminated or revoked sooner.  Performed at [facility], [address], [place], [place] [address]    No results found.  Review of Systems  Constitutional: Positive for activity change.  HENT: Negative.   Eyes: Negative.   Respiratory: Negative.   Cardiovascular: Negative.   Gastrointestinal:  Negative.   Endocrine: Negative.   Genitourinary: Negative.   Musculoskeletal: Positive for back pain, gait problem and myalgias.  Skin: Negative.   Allergic/Immunologic: Negative.   Neurological: Positive for weakness and numbness.  Hematological: Negative.   Psychiatric/Behavioral: Negative.     Blood pressure (!) 152/74, pulse (!) 108, temperature 98.1 F (36.7 C), temperature source Oral, resp. rate 18, height 5\' 2"  (1.575 m), weight 87.5 kg, SpO2 100 %. Physical Exam Constitutional:      Appearance: Normal appearance. She is obese.  HENT:     Head: Atraumatic.     Mouth/Throat:     Mouth: Mucous membranes are moist.  Eyes:     Extraocular Movements: Extraocular movements intact.     Pupils: Pupils are equal, round, and reactive to light.  Cardiovascular:     Rate and Rhythm: Regular rhythm.     Pulses: Normal pulses.  Pulmonary:  Effort: Pulmonary effort is normal.     Breath sounds: Normal breath sounds.  Abdominal:     General: Bowel sounds are normal.  Musculoskeletal:     Cervical back: Normal range of motion and neck supple.     Comments: Decreased straight leg raising to 15 degrees positive for back pain and buttock and leg pain.  Patrick's maneuver is negative bilaterally.  Palpation and percussion across the thoracolumbar spine reproduces moderate discomfort.  Patient tends to walk with a forward stoop.  She has difficulty standing into the straight and erect position.  Neurological:     Mental Status: She is alert.     Comments: Motor function reveals good strength in the iliopsoas at 4 out of 5 tibialis anterior and gastrocs are also 4 out of 5 abductors are 4 out of 5.  No fasciculations are noted.  Deep tendon reflexes are absent in the patellae and the Achilles.  Upper extremity strength is normal and reflexes are 1+ in the biceps absent in the triceps.  Sensation appears intact to pin and light touch.  Cranial nerve examination is within the limits of normal.   Psychiatric:        Mood and Affect: Mood normal.        Behavior: Behavior normal.        Thought Content: Thought content normal.        Judgment: Judgment normal.      Assessment/Plan Degenerative scoliosis spondylosis and stenosis T12-L1 L1-L2 L2-L3.  History of fusion L3 to sacrum.  Plan: Decompression fusion T10-L3.  Earleen Newport, MD 06/28/2020, 8:03 AM

## 2020-06-28 NOTE — Op Note (Signed)
Date of surgery: 06/28/2020 Preoperative diagnosis: Spondylosis and stenosis T12-L1 L1-L2 L2-L3.  Degenerative scoliosis.  Myelopathy. Postoperative diagnosis: Same Procedure: Anterolateral decompression of T12-L1 and L1-L2 using an XLIF technique and XLIF spacer.  Allograft arthrodesis using ostia cell.  EMG monitoring throughout the case and fluoroscopic image guidance.   Surgeon: Barnett Abu First Assistant: Hoyt Koch, MD Anesthesia: General endotracheal Indications: [name] is a 65 year old individuals had progressive back and leg pain she has a severe stenosis at the thoracolumbar junction secondary to the degenerative scoliosis she had a previous fusion at L3 to the sacrum.  Anterolateral decompression has been suggested and the patient is now being taken to the operating room for that procedure which will be combined with posterior fixation and decompression of the stenosis for a more thorough decompression.  There is plan to do L2-L3 however on examination it appears that she has formed a solid arthrodesis at the L2-L3 level.  Procedure: The patient was brought to the operating room supine on the stretcher.  After the smooth induction of general endotracheal anesthesia she was placed onto the operating table in the right lateral decubitus position EMG monitoring electrodes were placed in the major groups of the lumbar musculature to monitor the lumbar plexus.  Then when her orthogonal positioning was checked radiographically we taped her body into position.  The table had a break that was used to extend the close side on the left side of the vertebrae.  Entry sites were then chosen for the T12-L1 and L1-L2 interspaces.  These were marked on the surface of the skin through a singular incision.  Then the skin was prepped with alcohol DuraPrep and draped in a sterile fashion.  The incision was made in the chosen area after infiltrating with 1% lidocaine with epinephrine and half percent  Marcaine in a 50-50 mixture.  Dissection was carried down to the superficial fascia and to the first level was approachable below the 11th rib.  We placed an initial guide tube down to the L1-L2 lateral disc and anchored this with a K wire EMG stimulation was performed and no activity in the lumbar plexus was noted.  Then a series of dilators was passed all the time using EMG monitoring and stimulation and ultimately a 120 mm retractor was used to secure this to the operating table with a clamp and gradually opened to allow visualization of the disc space overlying the L1-L2 interspace.  Stimulation was performed in the depth to allow placement of a shim without any neural involvement.  The lateral aspects of the slips of muscle from the diaphragm were then cauterized and the disc space was entered.  Combination of curettes and rongeurs was used to evacuate some degenerated disc material and a series of dilators was passed to allow placement ultimately of a 10 x 22 mm spacer which measured 45 mm in width.  The endplates were decorticated adequately and good decompression of the posterior elements was noted.  With this the ostia cell was used to fill the spacer and place it under direct fluoroscopic visualization.  Once the positioning was verified the retractor was removed and attention was turned to T12-L1 for similar process was carried out using the exact same size spacer.  Good distraction of the interspaces was obtained a total of 7 cc of ostia cell was packed into each of the interspaces and each of the graft and the EMG monitoring remained silent during the procedure.  Once the retractors were removed the wound  was inspected and see that we had entered into the pleural cavity but the lung was inflating normally and thus the wound was closed with a fascial closure of 2-0 Vicryl and 3-0 Vicryl was used in a subcuticular skin.  Dermabond was placed on the skin.  Patient was then readied for continuation of the  procedure with being removed from the lateral position and being placed prone onto the [facility] table.  Blood loss for this portion of the procedure was approximately 50 cc

## 2020-06-28 NOTE — Procedures (Signed)
Repositioned chest tube with new suture and retraction of tube somewhat to attempt to remove kinking on imaging. Repeat cxr ordered but large amount of bubbling occurred after retraction and then has diminished.   Pt sats good at 99% and doing well.

## 2020-06-28 NOTE — Progress Notes (Signed)
Orthopedic Tech Progress Note Patient Details:  [name] 1955-09-30 [id number] Patient was sized into back brace Patient ID: [name], female   DOB: 04-20-1956, 65 y.o.   MRN: [id number]   Smitty Pluck 06/28/2020, 7:53 PM

## 2020-06-28 NOTE — Progress Notes (Signed)
1902 dr. Danielle Dess at bedside patient voiced "I cant breath" to MD c/o pain to left side Dr. Danielle Dess requested chest xray  1912 chest x ray obtained 1925 received call from Dr. Manson Passey reporting left total lung collapsed no tension and large pneumothorax  1925 results called to Dr. Danielle Dess

## 2020-06-28 NOTE — Op Note (Signed)
Date of surgery: 06/28/2020 Preoperative diagnosis: Degenerative scoliosis with stenosis T12-L1 L1-L2 L2-L3 history of fusion L3 to sacrum. Postoperative diagnosis: Same  Procedure: Laminectomy T12 L1-L2 decompression of spinal canal and individual nerve roots L1-L2-L3 and T12.  Pedicle screw fixation T10-S1 with screw placement using robotic assistance EMG neuro monitoring throughout the case posterior lateral arthrodesis with local autograft allograft and osteocell.  Pedicle screw Oxman mentation at T10-T11-T12.  Surgeon: Barnett Abu First Assistant: Hoyt Koch, MD Anesthesia: General endotracheal Indications: [name] is a 65 year old female who has had significant spondylosis and degenerative scoliosis.  She has had a previous decompression fusion from L3 to the sacrum but over the years she has developed progressive scoliosis above this area with severe stenosis.  She is advised regarding the need for surgical decompression which was done in 2 stages.  This is part to of the procedure which is a posterior decompression and fusion fixation part 1 has been dictated separately as an anterolateral decompression at the T12-L1 and L1-L2 levels.  Procedure: Patient had had the anterolateral decompression completed and she was turned from the lateral position prone onto the [place] table.  The back was prepped with alcohol DuraPrep and draped in a sterile fashion.  The robotic arm was attached to the operating table.  Then a midline incision was created in the lumbar spine through the old incision and carried down to identify the hardware from L3 to the sacrum.  This was gradually uncovered.  Then the lamina of L2-L3 L1 T12 T11 was exposed using a subperiosteal dissection all the way up to T10.  Once these areas were isolated the robotic arm was attached to the spinous process of L2 and L3 and registration radiographs were obtained.  Once the registration's were completed pedicle entry sites were  chosen at T10-T11 T12-L1 and L2.  Pedicle screws were then placed in the lumbar spine at L1 and L2 these were 6.5 x 45 mm pedicle screws.  At T10 and T11 6.5 x 45 mm cannulated fenestrated screws were placed under fluoroscopic guidance also using EMG monitoring to make sure there is no evidence of any cut out on all the screws.  At T12 because of the size and shape of the pedicle 5.5 x 45 mm fenestrated cannulated screws were placed.  A laminectomy was then undertaken from L2 on up to T12.  A remnant of the lamina of L3 was removed.  A careful decompression was then performed from T12 on down removing the thickened displaced laminar arches of T12-L1 and L2.  At L2 there was noted to be severe thinning of the dura and a durotomy was created dorsally.  This was secondary to severe stenosis in this region and the dura was markedly attached to the undersurface of the lamina.  The durotomy was encapsulated and then closed primarily with 6-0 Prolene sutures.  This required some time as the dural edges were hard to identify in their attachments to the lamina.  Nonetheless once this was completed the decompression was taken out further laterally to expose the common dural tube and the takeoff of the individual nerve roots of T12-L1 and L2 which were each individually decompressed using a 2 and 3 mm Kerrison punch and high-speed drill.  Once the decompression was noted to be adequate attention was turned to the L2-L3 interspace there appeared to be an arthrodesis that had formed at L2-L3 and there is some hope that we could mobilize this arthrodesis by breaking through it I used  an osteotome on the lateral aspect of the L2-L3 interspace and despite several attempts to create a fracture through the L2-L3 interspace this would not occur.  In that regard we decided not to pursue the osteotomy fully and to do the correction of the curvature as best we could with the rod fixation on the mobile segments above.  The screws at  T10-T11 and T12 were then augmented with 1 cc of methacrylate and each screw.  Radiographs obtained during the augmentation procedure demonstrated good filling around the screws and no evidence of extravasation of the methacrylate.  Next we used Bandini to create a singular rod from T10 to the sacrum after removing the screw heads of the [employer] instrumented hardware that had been placed from L3 to the sacrum.  The rods were contoured first on the right and then on the left and placed in a neutral construct with a slight bit of correction of the coronal and sagittal balance.  Once the screws were attached to the rod and placed in the saddles the original screw caps from the [employer] system were used on the 4 lower screws and screw caps from [facility] hardware used on the upper screws the rods were 5.5 mm in size.  Once the rods were placed and torqued down into the final position the posterior interspinous space at T10-T11-T12 was packed with autograft and allograft in the lateral gutters from T12-L2 were packed with autograft and allograft also.  Once all the packing was performed hemostasis in the soft tissues was obtained meticulously during the placement of the screws in the hardware EMG monitoring was performed continually no significant abnormalities were noted in any of the nerve roots.  With this then the lumbodorsal fascia was closed with #1 Vicryl in interrupted fashion should be noted that we did place DuraGen over the dural repair and the lumbar spine at the level of L2.  No CSF leakage was noted at the end of the case and the spinal tube had filled with spinal fluid under fairly normal pressure and consistency.  2-0 Vicryl was used in the subcutaneous tissues 3-0 Vicryl was used to close subcuticular skin and 4-0 Vicryl in the final subcuticular layer a total of 30 cc of half percent Marcaine was injected into the paraspinous muscle and fascia from T10 down to the sacrum.  Blood loss was estimated 700 cc  and 250 cc of Cell Saver blood was returned to the patient.  Patient tolerated procedure well is returned to recovery room stable condition.

## 2020-06-28 NOTE — Anesthesia Procedure Notes (Signed)
Arterial Line Insertion Start/End1/11/2020 7:10 AM Performed by: Jed Limerick, CRNA, CRNA  Patient location: Pre-op. Preanesthetic checklist: patient identified, IV checked, site marked, risks and benefits discussed, surgical consent, monitors and equipment checked, pre-op evaluation, timeout performed and anesthesia consent Lidocaine 1% used for infiltration Left, radial was placed Catheter size: 20 G Hand hygiene performed  and maximum sterile barriers used   Attempts: 1 Procedure performed without using ultrasound guided technique. Following insertion, dressing applied and Biopatch. Post procedure assessment: normal and unchanged  Patient tolerated the procedure well with no immediate complications.

## 2020-06-28 NOTE — Transfer of Care (Signed)
Immediate Anesthesia Transfer of Care Note  Patient: [name]  Procedure(s) Performed: Thoracic Twelve to Lumbar One, Lumbar One-Two; Anterior lateral fusion, pedicle screws Thoracic Ten to Lumbar Three, Laminectomy Thoracic Twelve--Lumbar Two (N/A Spine Lumbar) LUMBAR PERCUTANEOUS PEDICLE SCREW Thoracic Ten to Lumbar Three, Laminectomy Thoracic Twelve--Lumbar Two (N/A Spine Lumbar) APPLICATION OF ROBOTIC ASSISTANCE FOR SPINAL PROCEDURE (N/A Spine Lumbar)  Patient Location: PACU  Anesthesia Type:General  Level of Consciousness: drowsy and patient cooperative  Airway & Oxygen Therapy: Patient Spontanous Breathing and Patient connected to face mask oxygen  Post-op Assessment: Report given to RN and Post -op Vital signs reviewed and stable. Pt moving extremities x4.  Post vital signs: Reviewed  Last Vitals:  Vitals Value Taken Time  BP 109/64 06/28/20 1814  Temp    Pulse 109 06/28/20 1821  Resp 21 06/28/20 1821  SpO2 96 % 06/28/20 1821  Vitals shown include unvalidated device data.  Last Pain:  Vitals:   06/28/20 0611  TempSrc: Oral         Complications: No complications documented.

## 2020-06-28 NOTE — Consult Note (Signed)
NAME:  [name], MRN:  [id number], DOB:  March 31, 1956, LOS: 0 ADMISSION DATE:  06/28/2020, CONSULTATION DATE:  06/28/20 REFERRING MD:  Dr Ellene Route, CHIEF COMPLAINT:  Post operative ptx  Brief History   65 yo s/p anterolateral decompression of T12-L and L1-L2 developed post operative L ptx.   History of present illness   65 yo female with pmh anxiety, hypothyroidism, fibromyalgia, gerd, htn, hypothyroidism, rls and previous spine surgery who presented for elective surgery with Dr Ellene Route to amend her chronic pain and discomfort from spinal stenosis and kyphosis.  Pt describes difficulty breathing. On supplemental oxygen with sats in mid 90's. No cough, ++CP worse on L and across anterior chest. No tachypnea. Groggy from anesthesia  Called emergently for large L PTX noted post operatively  Past Medical History   Past Medical History:  Diagnosis Date  . Anxiety   . Arthritis   . Complication of anesthesia    during surgery she could hear staff talking and could feel them preping her arm over 30 yrs. ago  . Fibromyalgia   . GERD (gastroesophageal reflux disease)   . Hypertension   . Hypothyroidism   . Restless leg syndrome   . Staph infection    2015  . Wears glasses     Significant Hospital Events   1/6: post procedural pneumothorax  Consults:  1/6 ccm  Procedures:  1/6: spinal surgery 1/6: L ct placement  Significant Diagnostic Tests:    Micro Data:    Antimicrobials:  Cefazolin 1/6->  Interim history/subjective:  1/6: as above  Objective   Blood pressure 120/62, pulse (!) 103, temperature (!) 97 F (36.1 C), resp. rate (!) 174, height 5\' 2"  (1.575 m), weight 87.5 kg, SpO2 97 %.        Intake/Output Summary (Last 24 hours) at 06/28/2020 1938 Last data filed at 06/28/2020 1809 Gross per 24 hour  Intake 4685 ml  Output 2025 ml  Net 2660 ml   Filed Weights   06/28/20 4098  Weight: 87.5 kg    Examination: General: appears uncomfortable in bed post  operatively HEENT: NCAT, EOMI, PERRLA, MMMP Lungs: CTA R and absent on L Cardiovascular: RRR, no m/g/r Abdomen: soft, NT,ND, BS+ Extremities: no c/c/e Skin: no rashes, warm and dry, lateral/mid axillary incision c/d/i Neuro: moves all 4 extremities GU: deferred  Resolved Hospital Problem list     Assessment & Plan:  L PTX:  -suspect 2/2 recent spinal surgery  -placing L chest tube -consent obtained with pt and husband at bedside -cont oxygen support thru evening -repeat cxr after placement.  -maintain ct to -20cm H20 unless directed otherwise  Kyphosis and spinal stenosis:  -post operative care per surgery   Best practice:  Diet: per primary Pain/Anxiety/Delirium protocol (if indicated): per primary VAP protocol (if indicated): n/a DVT prophylaxis: per primary GI prophylaxis: n/a Glucose control: per primary Mobility: per primary Code Status: full Family Communication: updated husband at bedside Disposition:   Labs   CBC: Recent Labs  Lab 06/26/20 1108 06/28/20 1118 06/28/20 1312 06/28/20 1515  WBC 5.8  --   --   --   HGB 13.2 11.6* 11.2* 10.2*  HCT 40.5 34.0* 33.0* 30.0*  MCV 96.2  --   --   --   PLT 328  --   --   --     Basic Metabolic Panel: Recent Labs  Lab 06/26/20 1108 06/28/20 1118 06/28/20 1312 06/28/20 1515  NA 138 138 139 139  K 4.0 3.7  3.7 3.9  CL 102  --   --   --   CO2 28  --   --   --   GLUCOSE 97  --   --   --   BUN 12  --   --   --   CREATININE 0.89  --   --   --   CALCIUM 10.0  --   --   --    GFR: Estimated Creatinine Clearance: 64.8 mL/min (by C-G formula based on SCr of 0.89 mg/dL). Recent Labs  Lab 06/26/20 1108  WBC 5.8    Liver Function Tests: No results for input(s): AST, ALT, ALKPHOS, BILITOT, PROT, ALBUMIN in the last 168 hours. No results for input(s): LIPASE, AMYLASE in the last 168 hours. No results for input(s): AMMONIA in the last 168 hours.  ABG    Component Value Date/Time   PHART 7.413 06/28/2020  1515   PCO2ART 39.7 06/28/2020 1515   PO2ART 264 (H) 06/28/2020 1515   HCO3 25.4 06/28/2020 1515   TCO2 27 06/28/2020 1515   O2SAT 100.0 06/28/2020 1515     Coagulation Profile: No results for input(s): INR, PROTIME in the last 168 hours.  Cardiac Enzymes: No results for input(s): CKTOTAL, CKMB, CKMBINDEX, TROPONINI in the last 168 hours.  HbA1C: No results found for: HGBA1C  CBG: No results for input(s): GLUCAP in the last 168 hours.  Review of Systems:   As per HPI, all other review negative  Past Medical History  She,  has a past medical history of Anxiety, Arthritis, Complication of anesthesia, Fibromyalgia, GERD (gastroesophageal reflux disease), Hypertension, Hypothyroidism, Restless leg syndrome, Staph infection, and Wears glasses.   Surgical History    Past Surgical History:  Procedure Laterality Date  . ABDOMINAL HYSTERECTOMY  1981   partial  . APPLICATION OF WOUND VAC N/A 04/25/2014   Procedure: APPLICATION OF WOUND VAC;  Surgeon: [name], MD;  Location: [facility];  Service: Neurosurgery;  Laterality: N/A;  . BACK SURGERY  2007  . BLADDER SUSPENSION     2004  . CERVICAL FUSION  1990  . CERVICAL FUSION     2000  . CYST EXCISION Left 1985   wrist  . CYST EXCISION  1985   chest  . FOOT SURGERY Right 2009   reconstruction with hardware  . FOOT SURGERY Left 2013   reconstruction with hardware  . LUMBAR FUSION  03/2014  . LUMBAR WOUND DEBRIDEMENT N/A 04/25/2014   Procedure: LUMBAR WOUND DEBRIDEMENT Placement of Woundvac);  Surgeon: [name], MD;  Location: [facility];  Service: Neurosurgery;  Laterality: N/A;  . PICC LINE PLACE PERIPHERAL ([facility] HX)     2015-infusion therapy secondary to STAPH infection   . TONSILLECTOMY    . TOTAL KNEE ARTHROPLASTY Bilateral 02/27/2015   Procedure: RIGHT TOTAL KNEE ARTHROPLASTY, LEFT KNEE CORTISONE INJECTION;  Surgeon: Durene Romans, MD;  Location: [facility];  Service: Orthopedics;  Laterality: Bilateral;  .  TOTAL KNEE ARTHROPLASTY Left 06/19/2015   Procedure: LEFT TOTAL KNEE ARTHROPLASTY;  Surgeon: Durene Romans, MD;  Location: [facility];  Service: Orthopedics;  Laterality: Left;  . TUBAL LIGATION       Social History   reports that she has never smoked. She has never used smokeless tobacco. She reports that she does not drink alcohol and does not use drugs.   Family History   Her family history is not on file.   Allergies Allergies  Allergen Reactions  . Prednisone Other (  See Comments)    Chest pain  . Clindamycin/Lincomycin Rash  . Influenza Vaccines Hives  . Lodine [Etodolac] Rash  . Morphine And Related Other (See Comments)    "Doesn't work well"     Home Medications  Prior to Admission medications   Medication Sig Start Date End Date Taking? Authorizing Provider  acetaminophen (TYLENOL) 650 MG CR tablet Take 1,300 mg by mouth every 8 (eight) hours as needed for pain.   Yes [provider]  ALPRAZolam (XANAX) 0.25 MG tablet Take 0.25 mg by mouth 2 (two) times daily as needed for anxiety or sleep.   Yes [provider]  buPROPion (WELLBUTRIN XL) 150 MG 24 hr tablet Take 450 mg by mouth daily.   Yes [provider]  esomeprazole (NEXIUM) 20 MG capsule Take 20 mg by mouth daily.    Yes [provider]  hydroxychloroquine (PLAQUENIL) 200 MG tablet Take 200 mg by mouth 2 (two) times daily.   Yes [provider]  leflunomide (ARAVA) 20 MG tablet Take 20 mg by mouth at bedtime.   Yes [provider]  levothyroxine (SYNTHROID) 200 MCG tablet Take 200 mcg by mouth See admin instructions. Take 200 mcg daily except on Saturday take 100 mcg. Skip dose on Sundays.   Yes [provider]  losartan-hydrochlorothiazide (HYZAAR) 100-12.5 MG per tablet Take 1 tablet by mouth every morning.    Yes [provider]  meloxicam (MOBIC) 15 MG tablet Take 15 mg by mouth 2 (two) times daily.   Yes [provider]  pramipexole  (MIRAPEX) 0.125 MG tablet Take 0.125 mg by mouth 3 (three) times daily.   Yes [provider]  Probiotic Product (PROBIOTIC PO) Take 1 tablet by mouth every morning.   Yes [provider]  Vitamin D, Ergocalciferol, (DRISDOL) 1.25 MG (50000 UNIT) CAPS capsule Take 50,000 Units by mouth every Sunday. 11/25/19  Yes [provider]     Critical care time: The patient is critically ill with multiple organ systems failure and requires high complexity decision making for assessment and support, frequent evaluation and titration of therapies, application of advanced monitoring technologies and extensive interpretation of multiple databases.  Critical care time 34 mins. This represents my time independent of the NP's/PA's/med students/residents time taking care of the pt. This is excluding procedures.     Briant Sites DO [facility] 06/28/2020, 7:38 PM

## 2020-06-28 NOTE — Anesthesia Procedure Notes (Signed)
Procedure Name: Intubation Date/Time: 06/28/2020 8:06 AM Performed by: Candis Shine, CRNA Pre-anesthesia Checklist: Patient identified, Emergency Drugs available, Suction available and Patient being monitored Patient Re-evaluated:Patient Re-evaluated prior to induction Oxygen Delivery Method: Circle System Utilized Preoxygenation: Pre-oxygenation with 100% oxygen Induction Type: IV induction Ventilation: Mask ventilation without difficulty Laryngoscope Size: Mac and 3 Grade View: Grade I Tube type: Oral Tube size: 7.5 mm Number of attempts: 1 Airway Equipment and Method: Stylet Placement Confirmation: ETT inserted through vocal cords under direct vision,  positive ETCO2 and breath sounds checked- equal and bilateral Secured at: 22 cm Tube secured with: Tape Dental Injury: Teeth and Oropharynx as per pre-operative assessment

## 2020-06-28 NOTE — Procedures (Signed)
Insertion of Chest Tube Procedure Note  [name]  [id number]  1955-07-08  Date:06/28/20  Time:8:32 PM    Provider Performing: Briant Sites   Procedure: Chest Tube Insertion [phone])  Indication(s) Pneumothorax  Consent Risks of the procedure as well as the alternatives and risks of each were explained to the patient and/or caregiver.  Consent for the procedure was obtained and is signed in the bedside chart  Anesthesia Topical only with 1% lidocaine    Time Out Verified patient identification, verified procedure, site/side was marked, verified correct patient position, special equipment/implants available, medications/allergies/relevant history reviewed, required imaging and test results available.   Sterile Technique Maximal sterile technique including full sterile barrier drape, hand hygiene, sterile gown, sterile gloves, mask, hair covering, sterile ultrasound probe cover (if used).   Procedure Description Ultrasound not used to identify appropriate pleural anatomy for placement and overlying skin marked. Area of placement cleaned and draped in sterile fashion.  A 14 French pigtail pleural catheter was placed into the left pleural space using Seldinger technique. Appropriate return of air was obtained.  The tube was connected to atrium and placed on -20 cm H2O wall suction.   Complications/Tolerance None; patient tolerated the procedure well. Chest X-ray is ordered to verify placement.   EBL Minimal  Specimen(s) none

## 2020-06-28 NOTE — Anesthesia Postprocedure Evaluation (Addendum)
Anesthesia Post Note  Patient: [name]  Procedure(s) Performed: Thoracic Twelve to Lumbar One, Lumbar One-Two; Anterior lateral fusion, pedicle screws Thoracic Ten to Lumbar Three, Laminectomy Thoracic Twelve--Lumbar Two (N/A Spine Lumbar) LUMBAR PERCUTANEOUS PEDICLE SCREW Thoracic Ten to Lumbar Three, Laminectomy Thoracic Twelve--Lumbar Two (N/A Spine Lumbar) APPLICATION OF ROBOTIC ASSISTANCE FOR SPINAL PROCEDURE (N/A Spine Lumbar)     Patient location during evaluation: PACU Anesthesia Type: General Level of consciousness: awake and alert, patient cooperative and oriented Pain management: pain level controlled Vital Signs Assessment: post-procedure vital signs reviewed and stable Respiratory status: spontaneous breathing, nonlabored ventilation and patient connected to nasal cannula oxygen (pt with large pneumothorax on CXR: Dr Danielle Dess called) Cardiovascular status: blood pressure returned to baseline and stable Postop Assessment: no apparent nausea or vomiting Anesthetic complications: no Comments: Surgical complication: pneumothorax 20:20 Addendum: Dr. Gaynell Face ([facility]) has placed L chest tube, VSS, pt tolerated well     No complications documented.  Last Vitals:  Vitals:   06/28/20 1902 06/28/20 1915  BP: 110/75 120/62  Pulse: (!) 111 (!) 103  Resp: 14 (!) 174  Temp: (!) 36.1 C   SpO2: 96% 97%    Last Pain:  Vitals:   06/28/20 1815  TempSrc:   PainSc: Asleep                 [name],E. Raphaella Larkin

## 2020-06-29 ENCOUNTER — Encounter (HOSPITAL_COMMUNITY): Payer: Self-pay | Admitting: Neurological Surgery

## 2020-06-29 ENCOUNTER — Inpatient Hospital Stay (HOSPITAL_COMMUNITY): Payer: PPO

## 2020-06-29 DIAGNOSIS — J939 Pneumothorax, unspecified: Secondary | ICD-10-CM

## 2020-06-29 LAB — CBC
HCT: 27.8 % — ABNORMAL LOW (ref 36.0–46.0)
Hemoglobin: 9.2 g/dL — ABNORMAL LOW (ref 12.0–15.0)
MCH: 31.4 pg (ref 26.0–34.0)
MCHC: 33.1 g/dL (ref 30.0–36.0)
MCV: 94.9 fL (ref 80.0–100.0)
Platelets: 170 10*3/uL (ref 150–400)
RBC: 2.93 MIL/uL — ABNORMAL LOW (ref 3.87–5.11)
RDW: 13.4 % (ref 11.5–15.5)
WBC: 11.4 10*3/uL — ABNORMAL HIGH (ref 4.0–10.5)
nRBC: 0 % (ref 0.0–0.2)

## 2020-06-29 LAB — BASIC METABOLIC PANEL
Anion gap: 7 (ref 5–15)
BUN: 13 mg/dL (ref 8–23)
CO2: 25 mmol/L (ref 22–32)
Calcium: 8.6 mg/dL — ABNORMAL LOW (ref 8.9–10.3)
Chloride: 108 mmol/L (ref 98–111)
Creatinine, Ser: 0.81 mg/dL (ref 0.44–1.00)
GFR, Estimated: >60 mL/min (ref >60)
Glucose, Bld: 131 mg/dL — ABNORMAL HIGH (ref 70–99)
Potassium: 3.9 mmol/L (ref 3.5–5.1)
Sodium: 140 mmol/L (ref 135–145)

## 2020-06-29 MED ORDER — CHLORHEXIDINE GLUCONATE CLOTH 2 % EX PADS
6.0000 | MEDICATED_PAD | Freq: Every day | CUTANEOUS | Status: DC
  Administered 2020-06-29 – 2020-07-08 (×8): 6 via TOPICAL

## 2020-06-29 MED ORDER — SILVER SULFADIAZINE 1 % EX CREA
TOPICAL_CREAM | Freq: Two times a day (BID) | CUTANEOUS | Status: DC
  Administered 2020-06-29 – 2020-07-07 (×3): 1 via TOPICAL
  Filled 2020-06-29: qty 85

## 2020-06-29 MED ORDER — HYDROMORPHONE HCL 1 MG/ML IJ SOLN
0.2000 mg | INTRAMUSCULAR | Status: DC | PRN
  Administered 2020-06-29 (×3): 0.2 mg via INTRAVENOUS
  Filled 2020-06-29 (×4): qty 1

## 2020-06-29 MED ORDER — DIPHENHYDRAMINE HCL 50 MG/ML IJ SOLN
12.5000 mg | Freq: Four times a day (QID) | INTRAMUSCULAR | Status: DC | PRN
  Administered 2020-06-29: 12.5 mg via INTRAVENOUS
  Filled 2020-06-29: qty 1

## 2020-06-29 MED FILL — Thrombin For Soln 5000 Unit: CUTANEOUS | Qty: 5000 | Status: AC

## 2020-06-29 NOTE — Evaluation (Signed)
Physical Therapy Evaluation Patient Details Name: [name] MRN: [id number] DOB: January 22, 1956 Today's Date: 06/29/2020   History of Present Illness  The pt is a 65 yo female presenting s/p anterolateal decompression and fusion of T12-L1 and L1-L2 on 1/06. Pt developed post operative L ptx and is now s/p chest tube insertion. PMH includes: anxiety, arthritis, HTN, vertigo, fibromyalgia, previous lumbar fusion (L3-sacrum), and bilateral TKA.    Clinical Impression  Pt in bed upon arrival of PT, agreeable to evaluation at this time. Prior to admission the pt was mobilizing independently, but does report decreased activity tolerance due to onset of pain. The pt now presents with limitations in functional mobility, activity tolerance, strength, stability, and power due to above dx, and will continue to benefit from skilled PT to address these deficits. The pt was able to complete initial bed mobility with modA of 2, but was limited in further mobility due to drop in BP and related sx of dizziness, pallor, and confusion with sitting EOB. The pt was the returned to supine with SCDs and bed in trendelenburg, and BP stabilized. The pt will continue to benefit from skilled PT to progress deficits to allow for safe d/c home with family support.    VITALS: - sitting EOB: 110/50 (67) - sitting post 5 min: BP did not finish taking, SBP in 60s - supine HOB 33 deg: 96/54 (67) - supine HOB deg: 94/53 (66) - supine HOB 11deg with LE elevated: 99/50 (66) - supine in trendelenburg, SCDs on: 109/53 (70)    Follow Up Recommendations Home health PT;Supervision/Assistance - 24 hour    Equipment Recommendations  Rolling walker with 5" wheels;3in1 (PT)    Recommendations for Other Services       Precautions / Restrictions Precautions Precautions: Fall Precaution Comments: orthostatic hypotension Restrictions Weight Bearing Restrictions: No      Mobility  Bed Mobility Overal bed mobility: Needs  Assistance Bed Mobility: Supine to Sit;Sit to Supine     Supine to sit: Mod assist;+2 for physical assistance;+2 for safety/equipment Sit to supine: Max assist;+2 for physical assistance;+2 for safety/equipment   General bed mobility comments: modA to bring trunk up from [facility] with use of helicopter transfer for pt safety and comfort, maxA of 2 to return to bed due to near syncopal episode in sitting, and need for quick return to supine position    Transfers                 General transfer comment: deferred at this time due to near syncopal episode      Balance Overall balance assessment: Needs assistance Sitting-balance support: Bilateral upper extremity supported;Feet supported Sitting balance-Leahy Scale: Poor Sitting balance - Comments: reliant on UE support with static sitting, increased assist due to drop in BP                                     Pertinent Vitals/Pain Pain Assessment: Faces Faces Pain Scale: Hurts little more Pain Location: back with movement Pain Descriptors / Indicators: Discomfort;Sore Pain Intervention(s): Limited activity within patient's tolerance;Monitored during session;Premedicated before session;Repositioned    Home Living Family/patient expects to be discharged to:: Private residence Living Arrangements: Spouse/significant other Available Help at Discharge: Available 24 hours/day;Family Type of Home: House Home Access: Stairs to enter Entrance Stairs-Rails: Right Entrance Stairs-Number of Steps: 3 Home Layout: One level Home Equipment: Grab bars - toilet;Grab bars - tub/shower;Shower seat  Additional Comments: pt reports updated bathroom, but no DME    Prior Function Level of Independence: Independent         Comments: limited to short distances in the home due to onset of pain, able to complete errands with her husband if holding buggy     Hand Dominance   Dominant Hand: Right    Extremity/Trunk Assessment    Upper Extremity Assessment Upper Extremity Assessment: Defer to OT evaluation    Lower Extremity Assessment Lower Extremity Assessment: Generalized weakness (pt denies difference in sesnation, able to move BLE in the bed without assist)    Cervical / Trunk Assessment Cervical / Trunk Assessment: Other exceptions Cervical / Trunk Exceptions: s/p spinal surgery and L chest tube placement  Communication   Communication: No difficulties  Cognition Arousal/Alertness: Awake/alert Behavior During Therapy: WFL for tasks assessed/performed Overall Cognitive Status: Within Functional Limits for tasks assessed                                 General Comments: pt alert and oriented, able to answer questions with slightly increased time. pt became hypotensive during session resulting in slowed speech and mild confusion, resolved with improvement in BP      General Comments General comments (skin integrity, edema, etc.): BP 110/51 sitting EOB initially, after 5-10 min of sitting EOB pt with increased palor, reports of feeling dizzy and unwell. Pt with slight decrease in cognition, returned to supine with BP recorded 96/54. (BP unable to complete in sitting, systolic in 60s). with addition of SCDs and bed in trendelenburg position, BP increased to 109/53    Exercises General Exercises - Lower Extremity Ankle Circles/Pumps: AROM;Both;10 reps Heel Slides: AROM;Both;5 reps;Supine Heel Raises: AROM;Both;10 reps;Supine   Assessment/Plan    PT Assessment Patient needs continued PT services  PT Problem List Decreased strength;Decreased range of motion;Decreased activity tolerance;Decreased balance;Decreased mobility;Decreased coordination;Decreased safety awareness;Pain       PT Treatment Interventions DME instruction;Gait training;Stair training;Therapeutic activities;Functional mobility training;Therapeutic exercise;Balance training;Patient/family education    PT Goals (Current  goals can be found in the Care Plan section)  Acute Rehab PT Goals Patient Stated Goal: feel better PT Goal Formulation: With patient Time For Goal Achievement: 07/13/20 Potential to Achieve Goals: Good    Frequency Min 5X/week   Barriers to discharge        Co-evaluation PT/OT/SLP Co-Evaluation/Treatment: Yes Reason for Co-Treatment: For patient/therapist safety;To address functional/ADL transfers PT goals addressed during session: Mobility/safety with mobility;Balance         AM-PAC PT "6 Clicks" Mobility  Outcome Measure Help needed turning from your back to your side while in a flat bed without using bedrails?: A Lot Help needed moving from lying on your back to sitting on the side of a flat bed without using bedrails?: A Lot Help needed moving to and from a bed to a chair (including a wheelchair)?: A Lot Help needed standing up from a chair using your arms (e.g., wheelchair or bedside chair)?: A Lot Help needed to walk in hospital room?: Total Help needed climbing 3-5 steps with a railing? : Total 6 Click Score: 10    End of Session   Activity Tolerance: Treatment limited secondary to medical complications (Comment) (orthostatic hypotension) Patient left: in bed;with call bell/phone within reach;with bed alarm set;with family/visitor present Nurse Communication: Mobility status (hypotension) PT Visit Diagnosis: Other abnormalities of gait and mobility (R26.89);Pain Pain - part of body:  (  back)    Time: 4166-0630 PT Time Calculation (min) (ACUTE ONLY): 35 min   Charges:   PT Evaluation $PT Eval Moderate Complexity: 1 Mod          Karma Ganja, PT, DPT   Acute Rehabilitation Department Pager #: [phone]  Otho Bellows 06/29/2020, 1:40 PM

## 2020-06-29 NOTE — Progress Notes (Signed)
Patient ID: [name], female   DOB: 1955-07-17, 65 y.o.   MRN: [id number] Vital signs are stable Patient is much more comfortable after chest tube Chest tube has minimal output We can start to mobilize the patient with the chest tube in place We can defer using the brace until after the chest tube is out Postoperative labs show her hematocrit is 27, electrolytes okay and renal function is good  Plan to mobilize over this weekend and I am hopeful for discharge on Monday

## 2020-06-29 NOTE — Progress Notes (Addendum)
Patient noted to have swelling and redness around Left side of cheek and chin area. On call notified. PRN benadryl given as ordered. Ice was applied as a prior intervention.     0530 On call made aware of continued swelling and redness. Patient in need of breakthrough pain medi ation. PRN IV 0.2mg  dilaudid ordered.

## 2020-06-29 NOTE — Evaluation (Addendum)
Occupational Therapy Evaluation Patient Details Name: [name] MRN: [id number] DOB: October 19, 1955 Today's Date: 06/29/2020    History of Present Illness The pt is a 65 yo female presenting s/p anterolateal decompression and fusion of T12-L1 and L1-L2 on 1/06. Pt developed post operative L ptx and is now s/p chest tube insertion. PMH includes: anxiety, arthritis, HTN, vertigo, fibromyalgia, previous lumbar fusion (L3-sacrum), and bilateral TKA.   Clinical Impression   Patient is s/p back surgery resulting in functional limitations due to the deficits listed below (see OT problem list). Pt with chest tube on L side and noted to have L facial droop. Pt's spouse reporting face still not symmetrical. Pt with decreased BP during session with pale look and requires reposition to elevated BP. See below for values.  Patient will benefit from skilled OT acutely to increase independence and safety with ADLS to allow discharge HHOT.  Pt did get dilaudid 20 minutes prior to session.   Started education on back precautions but very limited due to pt stating "I am going to pass out" pt able to say ABC to J and needing help to immediately lay down. Pt verbalized no bending and lifting and therapy reinforced twisting. No education yet on brace don doff due to medical needs     Follow Up Recommendations  Home health OT    Equipment Recommendations  3 in 1 bedside commode;Other (comment) (RW)    Recommendations for Other Services       Precautions / Restrictions Precautions Precautions: Fall Precaution Comments: orthostatic hypotension Restrictions Weight Bearing Restrictions: No      Mobility Bed Mobility Overal bed mobility: Needs Assistance Bed Mobility: Supine to Sit;Sit to Supine     Supine to sit: Mod assist;+2 for physical assistance;+2 for safety/equipment Sit to supine: Max assist;+2 for physical assistance;+2 for safety/equipment   General bed mobility comments: modA to bring  trunk up from [facility] with use of helicopter transfer for pt safety and comfort, maxA of 2 to return to bed due to near syncopal episode in sitting, and need for quick return to supine position    Transfers                 General transfer comment: deferred at this time due to near syncopal episode    Balance Overall balance assessment: Needs assistance Sitting-balance support: Bilateral upper extremity supported;Feet supported Sitting balance-Leahy Scale: Poor Sitting balance - Comments: reliant on UE support with static sitting, increased assist due to drop in BP                                   ADL either performed or assessed with clinical judgement   ADL Overall ADL's : Needs assistance/impaired Eating/Feeding: Set up;Bed level Eating/Feeding Details (indicate cue type and reason): drinking from straw             Upper Body Dressing : Minimal assistance;Sitting   Lower Body Dressing: Maximal assistance Lower Body Dressing Details (indicate cue type and reason): don socks               General ADL Comments: pt noted to have asymmetrical smile with L facial droop. pt oriented x4. pt with decreased BP limiting session     Vision Baseline Vision/History: Wears glasses Wears Glasses: At all times       Perception     Praxis      Pertinent Vitals/Pain Pain  Assessment: Faces Faces Pain Scale: Hurts little more Pain Location: back with movement Pain Descriptors / Indicators: Discomfort;Sore Pain Intervention(s): Limited activity within patient's tolerance;Monitored during session;Premedicated before session;Repositioned     Hand Dominance Right   Extremity/Trunk Assessment Upper Extremity Assessment Upper Extremity Assessment: LUE deficits/detail LUE Deficits / Details: shoulder flexion 70 degrees with pain at ribs   Lower Extremity Assessment Lower Extremity Assessment: Defer to PT evaluation   Cervical / Trunk Assessment Cervical /  Trunk Assessment: Other exceptions Cervical / Trunk Exceptions: s/p spinal surgery and L chest tube placement   Communication Communication Communication: No difficulties   Cognition Arousal/Alertness: Awake/alert Behavior During Therapy: WFL for tasks assessed/performed Overall Cognitive Status: Within Functional Limits for tasks assessed                                 General Comments: pt alert and oriented, able to answer questions with slightly increased time. pt became hypotensive during session resulting in slowed speech and mild confusion, resolved with improvement in BP   General Comments  pt able to self reports changes as they are occurring BP 110/51 sitting EOB initially, after 5-10 min of sitting EOB pt with increased palor, reports of feeling dizzy and unwell. Pt with slight decrease in cognition, returned to supine with BP recorded 96/54. (BP unable to complete in sitting, systolic in 60s). with addition of SCDs and bed in trendelenburg position, BP increased to 109/53    Exercises Exercises: General Lower Extremity General Exercises - Lower Extremity Ankle Circles/Pumps: AROM;Both;10 reps Heel Slides: AROM;Both;5 reps;Supine Heel Raises: AROM;Both;10 reps;Supine   Shoulder Instructions      Home Living Family/patient expects to be discharged to:: Private residence Living Arrangements: Spouse/significant other Available Help at Discharge: Available 24 hours/day;Family Type of Home: House Home Access: Stairs to enter [employer] of Steps: 3 Entrance Stairs-Rails: Right Home Layout: One level     Bathroom Shower/Tub: Producer, television/film/video: Handicapped height     Home Equipment: Grab bars - toilet;Grab bars - tub/shower;Shower seat   Additional Comments: pt reports updated bathroom, but no DME pt has grandchildren that live next door ( 5 total ranging in ages from 4 to 60) she has 7 grandchildren total and one great grand child  due March 2022      Prior Functioning/Environment Level of Independence: Independent        Comments: limited to short distances in the home due to onset of pain, able to complete errands with her husband if holding buggy,        OT Problem List: Decreased activity tolerance;Impaired balance (sitting and/or standing);Decreased safety awareness;Decreased knowledge of use of DME or AE;Decreased knowledge of precautions;Obesity;Pain;Cardiopulmonary status limiting activity      OT Treatment/Interventions: Self-care/ADL training;Therapeutic exercise;Energy conservation;DME and/or AE instruction;Neuromuscular education;Modalities;Manual therapy;Therapeutic activities;Patient/family education;Balance training    OT Goals(Current goals can be found in the care plan section) Acute Rehab OT Goals Patient Stated Goal: feel better OT Goal Formulation: With patient/family Time For Goal Achievement: 07/13/20 Potential to Achieve Goals: Good  OT Frequency: Min 2X/week   Barriers to D/C:            Co-evaluation PT/OT/SLP Co-Evaluation/Treatment: Yes Reason for Co-Treatment: Complexity of the patient's impairments (multi-system involvement);Necessary to address cognition/behavior during functional activity;For patient/therapist safety;To address functional/ADL transfers PT goals addressed during session: Mobility/safety with mobility;Balance OT goals addressed during session: ADL's and self-care;Proper use of Adaptive  equipment and DME;Strengthening/ROM      AM-PAC OT "6 Clicks" Daily Activity     Outcome Measure Help from another person eating meals?: A Little Help from another person taking care of personal grooming?: A Lot Help from another person toileting, which includes using toliet, bedpan, or urinal?: A Lot Help from another person bathing (including washing, rinsing, drying)?: A Lot Help from another person to put on and taking off regular upper body clothing?: A Lot Help from  another person to put on and taking off regular lower body clothing?: A Lot 6 Click Score: 13   End of Session Nurse Communication: Mobility status;Precautions  Activity Tolerance: Treatment limited secondary to medical complications (Comment) (unable to progress due to BP decrease) Patient left: in bed;with call bell/phone within reach;with bed alarm set;with family/visitor present;with nursing/sitter in room;with SCD's reapplied  OT Visit Diagnosis: Unsteadiness on feet (R26.81);Pain                Time: 9432-7614 OT Time Calculation (min): 35 min Charges:  OT General Charges $OT Visit: 1 Visit OT Evaluation $OT Eval Moderate Complexity: 1 Mod   Brynn, OTR/L  Acute Rehabilitation Services Pager: [phone] Office: [phone] .   Jeri Modena 06/29/2020, 1:52 PM

## 2020-06-29 NOTE — Progress Notes (Signed)
RN clarified with patient about listed morphine allergy. Patient stated "It just doesn't work for me, but its not an allergy" .

## 2020-06-29 NOTE — Consult Note (Signed)
NAME:  [name], MRN:  [id number], DOB:  22-Mar-1956, LOS: 1 ADMISSION DATE:  06/28/2020, CONSULTATION DATE:  06/28/20 REFERRING MD:  Dr Danielle Dess, CHIEF COMPLAINT:  Post operative ptx  Brief History   64 yo s/p anterolateral decompression of T12-L and L1-L2 developed post operative L ptx.   History of present illness   65 yo female with pmh anxiety, hypothyroidism, fibromyalgia, gerd, htn, hypothyroidism, rls and previous spine surgery who presented for elective surgery with Dr Danielle Dess to amend her chronic pain and discomfort from spinal stenosis and kyphosis.  Pt describes difficulty breathing. On supplemental oxygen with sats in mid 90's. No cough, ++CP worse on L and across anterior chest. No tachypnea. Groggy from anesthesia  Called emergently for large L PTX noted post operatively  Past Medical History   Past Medical History:  Diagnosis Date  . Anxiety   . Arthritis   . Complication of anesthesia    during surgery she could hear staff talking and could feel them preping her arm over 30 yrs. ago  . Fibromyalgia   . GERD (gastroesophageal reflux disease)   . Hypertension   . Hypothyroidism   . Restless leg syndrome   . Staph infection    2015  . Wears glasses     Significant Hospital Events   1/6: post procedural pneumothorax  Consults:  1/6 ccm  Procedures:  1/6: spinal surgery 1/6: L ct placement  Significant Diagnostic Tests:    Micro Data:    Antimicrobials:  Cefazolin 1/6->  Interim history/subjective:  Reports improved dyspnea after chest tube readjusment  Objective   Blood pressure (!) 94/54, pulse 99, temperature 98.5 F (36.9 C), temperature source Oral, resp. rate 17, height 5\' 2"  (1.575 m), weight 87.5 kg, SpO2 100 %.        Intake/Output Summary (Last 24 hours) at 06/29/2020 1219 Last data filed at 06/29/2020 0600 Gross per 24 hour  Intake 4082.58 ml  Output 1985 ml  Net 2097.58 ml   Filed Weights   06/28/20 0611  Weight: 87.5 kg    Physical Exam: General: Well-appearing, no acute distress HENT: [place], AT, OP clear, MMM Eyes: EOMI, no scleral icterus Respiratory: Air entry with diminished left breath sounds bilaterally. Normal right lung exam. No crackles, wheezing or rales. L chest tube in place on suction, no air leak noted. Cardiovascular: RRR, -M/R/G, no JVD Extremities:-Edema,-tenderness Neuro: AAO x4, CNII-XII grossly intact  CXR 06/30/19 Resolved left pneumothorax with left chest tube in place Resolved Hospital Problem list     Assessment & Plan:  L PTX:  -suspect 2/2 recent spinal surgery  -Continue chest tube to suction -20cm H20 -Cont oxygensupport for goal SpO2 >88% -CXR tomorrow morning with plan for WS if stable  Kyphosis and spinal stenosis:  -post operative care per surgery  Pulmonary will continue to follow  Labs   CBC: Recent Labs  Lab 06/26/20 1108 06/28/20 1118 06/28/20 1312 06/28/20 1515 06/29/20 0133  WBC 5.8  --   --   --  11.4*  HGB 13.2 11.6* 11.2* 10.2* 9.2*  HCT 40.5 34.0* 33.0* 30.0* 27.8*  MCV 96.2  --   --   --  94.9  PLT 328  --   --   --  170    Basic Metabolic Panel: Recent Labs  Lab 06/26/20 1108 06/28/20 1118 06/28/20 1312 06/28/20 1515 06/29/20 0133  NA 138 138 139 139 140  K 4.0 3.7 3.7 3.9 3.9  CL 102  --   --   --  108  CO2 28  --   --   --  25  GLUCOSE 97  --   --   --  131*  BUN 12  --   --   --  13  CREATININE 0.89  --   --   --  0.81  CALCIUM 10.0  --   --   --  8.6*   GFR: Estimated Creatinine Clearance: 71.2 mL/min (by C-G formula based on SCr of 0.81 mg/dL). Recent Labs  Lab 06/26/20 1108 06/29/20 0133  WBC 5.8 11.4*    Liver Function Tests: No results for input(s): AST, ALT, ALKPHOS, BILITOT, PROT, ALBUMIN in the last 168 hours. No results for input(s): LIPASE, AMYLASE in the last 168 hours. No results for input(s): AMMONIA in the last 168 hours.  ABG    Component Value Date/Time   PHART 7.413 06/28/2020 1515   PCO2ART 39.7  06/28/2020 1515   PO2ART 264 (H) 06/28/2020 1515   HCO3 25.4 06/28/2020 1515   TCO2 27 06/28/2020 1515   O2SAT 100.0 06/28/2020 1515     Coagulation Profile: No results for input(s): INR, PROTIME in the last 168 hours.  Cardiac Enzymes: No results for input(s): CKTOTAL, CKMB, CKMBINDEX, TROPONINI in the last 168 hours.  HbA1C: No results found for: HGBA1C  CBG: No results for input(s): GLUCAP in the last 168 hours.  Care Time: 25 Minutes.   Mechele Collin, M.D. [facility] Pulmonary/Critical Care Medicine 06/29/2020 2:41 PM   Please see Amion for pager number to reach on-call Pulmonary and Critical Care Team.

## 2020-06-30 ENCOUNTER — Inpatient Hospital Stay (HOSPITAL_COMMUNITY): Payer: PPO

## 2020-06-30 DIAGNOSIS — J95811 Postprocedural pneumothorax: Secondary | ICD-10-CM | POA: Diagnosis not present

## 2020-06-30 NOTE — Progress Notes (Signed)
Occupational Therapy Treatment Patient Details Name: [name] MRN: [id number] DOB: 1956/02/13 Today's Date: 06/30/2020    History of present illness The pt is a 65 yo female presenting s/p anterolateal decompression and fusion of T12-L1 and L1-L2 on 1/06. Pt developed post operative L ptx and is now s/p chest tube insertion. PMH includes: anxiety, arthritis, HTN, vertigo, fibromyalgia, previous lumbar fusion (L3-sacrum), and bilateral TKA.   OT comments  Pt. Seen for skilled treatment session with PT. Pt. Able to complete bed mobility and sit/stand this session all with mostly mod/max a x2.  Pt. Remains with nausea and dizziness along with orthostatic fluctuations with BP.  C/o pain at chest tube site and difficulty  "catching her breath".  PT provided her with breathing spirometer with instructions for use.  Pt. Able to return demo.   Will cont. To follow along and progress mobility and introduction of ADLs as pt. Able.     Blood Pressures during tx. Session:    Supine: 120/54-75 Sitting: 139/62-85 Sit, post stand: 71/45-53 Sit: 95/70-79 Supine: 118/65-79   Follow Up Recommendations  Home health OT    Equipment Recommendations  3 in 1 bedside commode;Other (comment)    Recommendations for Other Services      Precautions / Restrictions Precautions Precautions: Fall;Back Precaution Comments: orthostatic hypotension, L chest tube (on suction) Restrictions Weight Bearing Restrictions: No       Mobility Bed Mobility Overal bed mobility: Needs Assistance Bed Mobility: Rolling;Sidelying to Sit;Sit to Sidelying Rolling: Mod assist Sidelying to sit: Mod assist;+2 for physical assistance;+2 for safety/equipment;HOB elevated Supine to sit: Mod assist;+2 for physical assistance;+2 for safety/equipment Sit to supine: Max assist;+2 for physical assistance;+2 for safety/equipment Sit to sidelying: Mod assist;+2 for physical assistance;+2 for safety/equipment;HOB elevated General  bed mobility comments: modA to roll onto R side with assist for lines/tubes. then modA of 2 to elevate trunk from [facility] with precautions  Transfers Overall transfer level: Needs assistance Equipment used: Rolling walker (2 wheeled) Transfers: Sit to/from Stand Sit to Stand: Min assist;+2 safety/equipment         General transfer comment: minA of 2 to power up from bed and steady with RW. Upon standing pt reports feeling dizzy and "like yesterday", BP recorded as 71/45 (53) upon pt return to sitting due to sx    Balance Overall balance assessment: Needs assistance Sitting-balance support: Feet supported Sitting balance-Leahy Scale: Fair Sitting balance - Comments: pt prefers UE support, but able to maintain static sitting without physical assist   Standing balance support: Bilateral upper extremity supported Standing balance-Leahy Scale: Poor Standing balance comment: reliant on BUE support                           ADL either performed or assessed with clinical judgement   ADL Overall ADL's : Needs assistance/impaired                                       General ADL Comments: pt. with cont. nausea and dizziness but able to complete intial bed mobility and sit/stand in prep for mobility and adl progression     Vision       Perception     Praxis      Cognition Arousal/Alertness: Awake/alert Behavior During Therapy: WFL for tasks assessed/performed Overall Cognitive Status: Within Functional Limits for tasks assessed  General Comments: pt alert and oriented, good memory of PT session yesterday. Able to relay needs        Exercises     Shoulder Instructions       General Comments Pt able to report changes in sx with mobility and monitor. Pt able to maintain seated balance for 5-10 min with reports of steady sx, but then reports increased dizziness and near-syncope with standing. BP 71/45 (53)  upon return to sitting. Pt then returned to supine  for BP recovery.    Pertinent Vitals/ Pain       Pain Assessment: Faces Faces Pain Scale: Hurts even more Pain Location: back with movement Pain Descriptors / Indicators: Discomfort;Sore Pain Intervention(s): Repositioned;Monitored during session;Limited activity within patient's tolerance  Home Living                                          Prior Functioning/Environment              Frequency  Min 2X/week        Progress Toward Goals  OT Goals(current goals can now be found in the care plan section)  Progress towards OT goals: Progressing toward goals  Acute Rehab OT Goals Patient Stated Goal: feel better  Plan      Co-evaluation      Reason for Co-Treatment: To address functional/ADL transfers;Complexity of the patient's impairments (multi-system involvement) PT goals addressed during session: Mobility/safety with mobility;Balance;Strengthening/ROM OT goals addressed during session: ADL's and self-care;Proper use of Adaptive equipment and DME;Strengthening/ROM      AM-PAC OT "6 Clicks" Daily Activity     Outcome Measure   Help from another person eating meals?: A Little Help from another person taking care of personal grooming?: A Lot Help from another person toileting, which includes using toliet, bedpan, or urinal?: A Lot Help from another person bathing (including washing, rinsing, drying)?: A Lot Help from another person to put on and taking off regular upper body clothing?: A Lot Help from another person to put on and taking off regular lower body clothing?: A Lot 6 Click Score: 13    End of Session    OT Visit Diagnosis: Unsteadiness on feet (R26.81);Pain   Activity Tolerance Other (comment) (limited with dizziness and nausea)   Patient Left in bed;with call bell/phone within reach;with SCD's reapplied   Nurse Communication          Time: 5638-7564 OT Time Calculation  (min): 40 min  Charges: OT General Charges $OT Visit: 1 Visit OT Treatments $Self Care/Home Management : 8-22 mins  Boneta Lucks, COTA/L Acute Rehabilitation [phone]   Robet Leu 06/30/2020, 11:44 AM

## 2020-06-30 NOTE — Progress Notes (Signed)
Subjective: The patient is alert and pleasant.  Her husband is at the bedside.  She complains of discomfort from the left chest tube and back pain.  She had urinary retention this morning with a liter removed with INO cath.  She has a pure wick in place presently.  Objective: Vital signs in last 24 hours: Temp:  [97.7 F (36.5 C)-99.7 F (37.6 C)] 98.5 F (36.9 C) (01/08 0700) Pulse Rate:  [90-107] 101 (01/08 0700) Resp:  [15-20] 15 (01/08 0700) BP: (94-115)/(52-66) 115/52 (01/08 0700) SpO2:  [99 %-100 %] 100 % (01/08 0700) Estimated body mass index is 35.3 kg/m as calculated from the following:   Height as of this encounter: 5\' 2"  (1.575 m).   Weight as of this encounter: 87.5 kg.   Intake/Output from previous day: 01/07 0701 - 01/08 0700 In: 2977.7 [I.V.:2977.7] Out: 2460 [Urine:2450; Chest Tube:10] Intake/Output this shift: No intake/output data recorded.  Physical exam the patient is alert and oriented.  She is moving her lower extremities well.  Lab Results: Recent Labs    06/28/20 1515 06/29/20 0133  WBC  --  11.4*  HGB 10.2* 9.2*  HCT 30.0* 27.8*  PLT  --  170   BMET Recent Labs    06/28/20 1515 06/29/20 0133  NA 139 140  K 3.9 3.9  CL  --  108  CO2  --  25  GLUCOSE  --  131*  BUN  --  13  CREATININE  --  0.81  CALCIUM  --  8.6*    Studies/Results: DG Lumbar Spine 2-3 Views  Result Date: 06/28/2020 CLINICAL DATA:  Surgery, elective. Additional history provided: T10-S1 posterior fusion. Provided fluoroscopy time 34 seconds (6.48 mGy). EXAM: LUMBAR SPINE - 2-3 VIEW; DG C-ARM 1-60 MIN COMPARISON:  Intraoperative fluoroscopic image of the thoracolumbar spine from earlier today 06/28/2020. CT of the lumbar spine 05/03/2020. CT of the thoracic spine 03/21/2020. FINDINGS: Three intraoperative fluoroscopic images of the thoracic, lumbar and sacral spine are submitted. The images demonstrate a posterior spinal fusion construct spanning the T10-S1 levels (utilizing  bilateral pedicle screws and vertical interconnecting rods). Interbody devices are present at T12-L1 and L1-L2. IMPRESSION: Three intraoperative fluoroscopic images of the thoracic, lumbar and sacral spine as described. Electronically Signed   By: 03/23/2020 DO   On: 06/28/2020 17:32   DG Lumbar Spine 2-3 Views  Result Date: 06/28/2020 CLINICAL DATA:  Surgical fusion of T12-L1 and L1-2. EXAM: LUMBAR SPINE - 2-3 VIEW Radiation exposure index: 63.12 mGy. COMPARISON:  June 01, 2020. FINDINGS: Three intraoperative fluoroscopic images were obtained of the upper lumbar spine. Status post surgical interbody fusion of T12-L1 and L1-2. Good alignment of vertebral bodies is noted. Surgical posterior fusion of lower lumbar spine which was done remotely is also noted. No other radiopaque foreign body is noted. IMPRESSION: Fluoroscopic guidance provided during surgical interbody fusion of T12-L1 and L1-2. These results were called by telephone at the time of interpretation on 06/28/2020 at 10:27 am to provider Dory Day, who verbally acknowledged these results. Electronically Signed   By: 08/26/2020 M.D.   On: 06/28/2020 10:27   DG CHEST PORT 1 VIEW  Result Date: 06/29/2020 CLINICAL DATA:  Pneumothorax, chest tube EXAM: PORTABLE CHEST 1 VIEW COMPARISON:  06/28/2020 at 8:51 p.m. FINDINGS: Left basilar pigtail chest tube is again identified. The catheter appears kinked just beyond the retaining loop. Lung volumes are small, but are symmetric. Minimal left basilar atelectasis. No pneumothorax or pleural effusion. Cardiac  size within normal limits. Thoracolumbar fusion hardware is partially visualized. Left chest wall subcutaneous gas again noted. IMPRESSION: Interval slight repositioning of the a left basilar pigtail chest tube with complete evacuation of previously noted pneumothorax. The catheter does, however, appear kinked and correlation for appropriate catheter position at the skin site may be helpful.  Electronically Signed   By: Helyn Numbers MD   On: 06/29/2020 06:15   DG CHEST PORT 1 VIEW  Result Date: 06/28/2020 CLINICAL DATA:  Left pneumothorax status post chest tube placement EXAM: PORTABLE CHEST 1 VIEW COMPARISON:  06/28/2020 FINDINGS: Single frontal view of the chest demonstrates interval placement of a left pigtail drainage catheter overlying the left lung base. Partial re-expansion of the collapsed left lung, with moderate residual left pneumothorax volume estimated 50%. No tension effect or midline shift. Extensive subcutaneous gas left chest wall. The right lung is clear. Cardiac silhouette is unremarkable. Stable postsurgical changes at the thoracolumbar junction. IMPRESSION: 1. Interval left chest tube placement, with partial re-expansion of the left lung and moderate residual left pneumothorax. Electronically Signed   By: Sharlet Salina M.D.   On: 06/28/2020 20:56   DG Chest Port 1 View  Result Date: 06/28/2020 CLINICAL DATA:  Pneumothorax, recent lumbar spine fusion EXAM: PORTABLE CHEST 1 VIEW COMPARISON:  03/22/2014 FINDINGS: Single frontal view of the chest demonstrates a large left pneumothorax measuring greater than 75%, with near total collapse of the left lung. No tension effect or midline shift. Subcutaneous gas is seen within the left chest wall. The right chest is clear. Postsurgical changes are seen at the thoracolumbar junction. IMPRESSION: 1. Large left pneumothorax volume estimated greater than 75%, with complete collapse of the left lung. No tension effect. 2. Subcutaneous gas throughout the left chest wall. 3. Postsurgical changes at the thoracolumbar junction. Critical Value/emergent results were called by telephone at the time of interpretation on 06/28/2020 at 719pm to patient's nurse, Victorino Dike, who verbally acknowledged these results. Electronically Signed   By: Sharlet Salina M.D.   On: 06/28/2020 19:22   DG C-Arm 1-60 Min  Result Date: 06/28/2020 CLINICAL DATA:   Surgery, elective. Additional history provided: T10-S1 posterior fusion. Provided fluoroscopy time 34 seconds (6.48 mGy). EXAM: LUMBAR SPINE - 2-3 VIEW; DG C-ARM 1-60 MIN COMPARISON:  Intraoperative fluoroscopic image of the thoracolumbar spine from earlier today 06/28/2020. CT of the lumbar spine 05/03/2020. CT of the thoracic spine 03/21/2020. FINDINGS: Three intraoperative fluoroscopic images of the thoracic, lumbar and sacral spine are submitted. The images demonstrate a posterior spinal fusion construct spanning the T10-S1 levels (utilizing bilateral pedicle screws and vertical interconnecting rods). Interbody devices are present at T12-L1 and L1-L2. IMPRESSION: Three intraoperative fluoroscopic images of the thoracic, lumbar and sacral spine as described. Electronically Signed   By: Jackey Loge DO   On: 06/28/2020 17:32    Assessment/Plan: Postop day #2: She is doing well neurologically.  We will continue to progress with PT and OT.  Pneumothorax: I appreciate critical care's management of this.  Urinary tension: She is comfortable with the pure wick.  LOS: 2 days     Cristi Loron 06/30/2020, 9:15 AM

## 2020-06-30 NOTE — Progress Notes (Signed)
Patient reported "I feel like my bladder is full". This RN has seen some urine output via purewick overnight. Bladder scan was obtained. Result was >951mL. RN paged on call provider. Straight cath was done. 1600 mL was pulled from straight cath.

## 2020-06-30 NOTE — Progress Notes (Signed)
NAME:  [name], MRN:  [id number], DOB:  [birth year], LOS: 2 ADMISSION DATE:  06/28/2020, CONSULTATION DATE:  06/28/20 REFERRING MD:  Dr Danielle Dess, CHIEF COMPLAINT:  Post operative ptx  Brief History   65 yo s/p anterolateral decompression of T12-L and L1-L2 developed post operative L ptx.   History of present illness   65 yo female with pmh anxiety, hypothyroidism, fibromyalgia, gerd, htn, hypothyroidism, rls and previous spine surgery who presented for elective surgery with Dr Danielle Dess to amend her chronic pain and discomfort from spinal stenosis and kyphosis.  Pt describes difficulty breathing. On supplemental oxygen with sats in mid 90's. No cough, ++CP worse on L and across anterior chest. No tachypnea. Groggy from anesthesia  Called emergently for large L PTX noted post operatively  Past Medical History   Past Medical History:  Diagnosis Date  . Anxiety   . Arthritis   . Complication of anesthesia    during surgery she could hear staff talking and could feel them preping her arm over 30 yrs. ago  . Fibromyalgia   . GERD (gastroesophageal reflux disease)   . Hypertension   . Hypothyroidism   . Restless leg syndrome   . Staph infection    2015  . Wears glasses     Significant Hospital Events   1/6: post procedural pneumothorax  Consults:  1/6 ccm  Procedures:  1/6: spinal surgery 1/6: L ct placement  Significant Diagnostic Tests:    Micro Data:    Antimicrobials:  Cefazolin 1/6->  Interim history/subjective:   Had urinary retention overnight Denies dyspnea. Minimal pain at chest tube site Chest tube output of 100 cc last 24 hours  Objective   Blood pressure 121/62, pulse 98, temperature 98.9 F (37.2 C), temperature source Oral, resp. rate 19, height 5\' 2"  (1.575 m), weight 87.5 kg, SpO2 92 %.        Intake/Output Summary (Last 24 hours) at 06/30/2020 1336 Last data filed at 06/30/2020 0530 Gross per 24 hour  Intake 2977.68 ml  Output 2460 ml  Net  517.68 ml   Filed Weights   06/28/20 0611  Weight: 87.5 kg   Physical Exam: Gen. Pleasant, well-nourished, in no distress ENT - no thrush, no pallor/icterus,no post nasal drip Neck: No JVD, no thyromegaly, no carotid bruits Lungs: no use of accessory muscles, no dullness to percussion, clear without rales or rhonchi , no air leak on chest tube Cardiovascular: Rhythm regular, heart sounds  normal, no murmurs or gallops, no peripheral edema Musculoskeletal: No deformities, no cyanosis or clubbing    Chest x-ray 1/8 independently reviewed shows no pneumothorax, left pigtail in position appears kinked Resolved Hospital Problem list     Assessment & Plan:  L PTX:  -suspect 2/2 recent spinal surgery  -Place chest tube to waterseal. Chest x-ray tomorrow, if no pneumothorax will discontinue  Urinary retention -status post in and out cath. Consider Urecholine  Kyphosis and spinal stenosis:  -post operative care per surgery  Pulmonary will continue to follow  Labs   CBC: Recent Labs  Lab 06/26/20 1108 06/28/20 1118 06/28/20 1312 06/28/20 1515 06/29/20 0133  WBC 5.8  --   --   --  11.4*  HGB 13.2 11.6* 11.2* 10.2* 9.2*  HCT 40.5 34.0* 33.0* 30.0* 27.8*  MCV 96.2  --   --   --  94.9  PLT 328  --   --   --  170    Basic Metabolic Panel: Recent Labs  Lab 06/26/20  1108 06/28/20 1118 06/28/20 1312 06/28/20 1515 06/29/20 0133  NA 138 138 139 139 140  K 4.0 3.7 3.7 3.9 3.9  CL 102  --   --   --  108  CO2 28  --   --   --  25  GLUCOSE 97  --   --   --  131*  BUN 12  --   --   --  13  CREATININE 0.89  --   --   --  0.81  CALCIUM 10.0  --   --   --  8.6*   GFR: Estimated Creatinine Clearance: 71.2 mL/min (by C-G formula based on SCr of 0.81 mg/dL). Recent Labs  Lab 06/26/20 1108 06/29/20 0133  WBC 5.8 11.4*    Cyril Mourning MD. FCCP. [facility] See Amion for pager  If no response to pager , please call [phone]  After 7:00 pm call Elink   [phone]   06/30/2020

## 2020-06-30 NOTE — Progress Notes (Signed)
Physical Therapy Treatment Patient Details Name: [name] MRN: [id number] DOB: [birth year] Today's Date: 06/30/2020    History of Present Illness The pt is a 65 yo female presenting s/p anterolateal decompression and fusion of T12-L1 and L1-L2 on 1/06. Pt developed post operative L ptx and is now s/p chest tube insertion. PMH includes: anxiety, arthritis, HTN, vertigo, fibromyalgia, previous lumbar fusion (L3-sacrum), and bilateral TKA.    PT Comments    The pt was eager and agreeable to PT session this morning with focus on progression of OOB mobility and activity tolerance. The pt was able to complete bed mobility and transition to EOB with use of log roll with modA of 1-2 for physical assist and line management. The pt did report slight onset of dizziness with transition to sitting EOB, but was able to maintain  Seated position without assist or progression of sx. Upon attempt to stand, pt required only minA of 2 to rise and minG to steady with RW, but pt reported significant dizziness requiring return to sitting, BP recorded 71/45 (53), and pt assisted to return to supine position. The pt was given incentive spirometer and educated on breathing techniques to maintain SpO2 with increased activity and anxiety. The pt will continue to benefit from skilled PT to progress strength, endurance, functional OOB transfers, and initiation of gait training, but will also benefit from increased upright positioning throughout the day with use of bed.     Follow Up Recommendations  Home health PT;Supervision/Assistance - 24 hour     Equipment Recommendations  Rolling walker with 5" wheels;3in1 (PT)    Recommendations for Other Services       Precautions / Restrictions Precautions Precautions: Fall;Back Precaution Comments: orthostatic hypotension, L chest tube (on suction) Restrictions Weight Bearing Restrictions: No    Mobility  Bed Mobility Overal bed mobility: Needs Assistance Bed  Mobility: Rolling;Sidelying to Sit;Sit to Sidelying Rolling: Mod assist Sidelying to sit: Mod assist;+2 for physical assistance;+2 for safety/equipment;HOB elevated     Sit to sidelying: Mod assist;+2 for physical assistance;+2 for safety/equipment;HOB elevated General bed mobility comments: modA to roll onto R side with assist for lines/tubes. then modA of 2 to elevate trunk from [facility] with precautions  Transfers Overall transfer level: Needs assistance Equipment used: Rolling walker (2 wheeled) Transfers: Sit to/from Stand Sit to Stand: Min assist;+2 safety/equipment         General transfer comment: minA of 2 to power up from bed and steady with RW. Upon standing pt reports feeling dizzy and "like yesterday", BP recorded as 71/45 (53) upon pt return to sitting due to sx     Balance Overall balance assessment: Needs assistance Sitting-balance support: Feet supported Sitting balance-Leahy Scale: Fair Sitting balance - Comments: pt prefers UE support, but able to maintain static sitting without physical assist   Standing balance support: Bilateral upper extremity supported Standing balance-Leahy Scale: Poor Standing balance comment: reliant on BUE support                            Cognition Arousal/Alertness: Awake/alert Behavior During Therapy: WFL for tasks assessed/performed Overall Cognitive Status: Within Functional Limits for tasks assessed                                 General Comments: pt alert and oriented, good memory of PT session yesterday. Able to relay needs  Exercises      General Comments General comments (skin integrity, edema, etc.): Pt able to report changes in sx with mobility and monitor. Pt able to maintain seated balance for 5-10 min with reports of steady sx, but then reports increased dizziness and near-syncope with standing. BP 71/45 (53) upon return to sitting. Pt then returned to supine  for BP recovery.       Pertinent Vitals/Pain Pain Assessment: Faces Faces Pain Scale: Hurts even more Pain Location: back with movement Pain Descriptors / Indicators: Discomfort;Sore Pain Intervention(s): Limited activity within patient's tolerance;Monitored during session;Repositioned           PT Goals (current goals can now be found in the care plan section) Acute Rehab PT Goals Patient Stated Goal: feel better PT Goal Formulation: With patient Time For Goal Achievement: 07/13/20 Potential to Achieve Goals: Good Progress towards PT goals: Progressing toward goals    Frequency    Min 5X/week      PT Plan Current plan remains appropriate    Co-evaluation PT/OT/SLP Co-Evaluation/Treatment: Yes Reason for Co-Treatment: Complexity of the patient's impairments (multi-system involvement);For patient/therapist safety;To address functional/ADL transfers PT goals addressed during session: Mobility/safety with mobility;Balance;Strengthening/ROM        AM-PAC PT "6 Clicks" Mobility   Outcome Measure  Help needed turning from your back to your side while in a flat bed without using bedrails?: A Lot Help needed moving from lying on your back to sitting on the side of a flat bed without using bedrails?: A Lot Help needed moving to and from a bed to a chair (including a wheelchair)?: A Lot Help needed standing up from a chair using your arms (e.g., wheelchair or bedside chair)?: A Lot Help needed to walk in hospital room?: Total Help needed climbing 3-5 steps with a railing? : Total 6 Click Score: 10    End of Session Equipment Utilized During Treatment: Gait belt Activity Tolerance: Treatment limited secondary to medical complications (Comment) Patient left: in bed;with call bell/phone within reach;with bed alarm set;with family/visitor present Nurse Communication: Mobility status (continued hypotension) PT Visit Diagnosis: Other abnormalities of gait and mobility (R26.89);Pain Pain - part of  body:  (back)     Time: 1937-9024 PT Time Calculation (min) (ACUTE ONLY): 39 min  Charges:  $Therapeutic Activity: 23-37 mins                     Rolm Baptise, PT, DPT   Acute Rehabilitation Department Pager #: [phone]   Gaetana Michaelis 06/30/2020, 10:09 AM

## 2020-07-01 ENCOUNTER — Inpatient Hospital Stay (HOSPITAL_COMMUNITY): Payer: PPO

## 2020-07-01 DIAGNOSIS — J95811 Postprocedural pneumothorax: Secondary | ICD-10-CM | POA: Diagnosis not present

## 2020-07-01 MED ORDER — HYDROMORPHONE HCL 1 MG/ML IJ SOLN: 0.5000 mg | INTRAMUSCULAR | Status: DC | PRN

## 2020-07-01 NOTE — Progress Notes (Signed)
Physical Therapy Treatment Patient Details Name: [name] MRN: [id number] DOB: 08/14/1955 Today's Date: 07/01/2020    History of Present Illness The pt is a 65 yo female presenting s/p anterolateal decompression and fusion of T12-L1 and L1-L2 on 1/06. Pt developed post operative L ptx and is now s/p chest tube insertion. PMH includes: anxiety, arthritis, HTN, vertigo, fibromyalgia, previous lumbar fusion (L3-sacrum), and bilateral TKA.    PT Comments    Continuing work on functional mobility and activity tolerance;  Session focused on safely progressing activity tolerance with the goal of getting OOB to recliner; Maintained constant monitoring of pt's symptoms throughout position changes involved with transitioning OOB; Required min assist for bed mobility and transfers, with second therapist as well for safety with chest tube, and for monitoring pt; Noted symptoms as follows:   Notable that severity of headache changed with positioning as follows: 5/10, seated EOB 9/10 standing 3/10, seated in recliner, feet up  Serial BPs as follows: Supine 112/63, HR 103  Sitting 128/70, HR 106  Standing (initial, unable to relax arm) 120/103, HR observed as high as 129  Started standing (at 3-5 minutes), but unable to stand long enough for BP reading, so seated when we got the reading 90/50, HR 92  Seated, reclined, feet up 107/60, HR 88       Follow Up Recommendations  Home health PT;Supervision/Assistance - 24 hour     Equipment Recommendations  Rolling walker with 5" wheels;3in1 (PT)    Recommendations for Other Services       Precautions / Restrictions Precautions Precautions: Fall;Back Precaution Comments: orthostatic hypotension, L chest tube (on suction)    Mobility  Bed Mobility Overal bed mobility: Needs Assistance Bed Mobility: Rolling;Sidelying to Sit Rolling: Min assist Sidelying to sit: Min assist       General bed mobility comments: Reminder cues  for log roll technique, which she managed better than previous PT/OT session; tactile cueing to reciprocally scoot to EOB while still maintaining back precautions  Transfers Overall transfer level: Needs assistance Equipment used: Rolling walker (2 wheeled) Transfers: Sit to/from [employer] Sit to Stand: Min assist;+2 safety/equipment Stand pivot transfers: Min assist;+2 safety/equipment       General transfer comment: Min assist to power up and steady in standing; then min assist, encouragement, and cues for RW to take pivotal steps bed to recliner; second person managing Chest tube  Ambulation/Gait Ambulation/Gait assistance: Min assist;+2 safety/equipment Gait Distance (Feet):  (pivotal steps bed to recliner as noted above) Assistive device: Rolling walker (2 wheeled)       General Gait Details: Cues to self-monitor for activity tolerance   Stairs             Wheelchair Mobility    Modified Rankin (Stroke Patients Only)       Balance     Sitting balance-Leahy Scale: Fair       Standing balance-Leahy Scale: Poor                              Cognition Arousal/Alertness: Awake/alert Behavior During Therapy: WFL for tasks assessed/performed Overall Cognitive Status: Within Functional Limits for tasks assessed                                 General Comments: pt alert and oriented, good memory of PT session yesterday. Able to relay needs  Exercises      General Comments General comments (skin integrity, edema, etc.): Improved activity tolerance; see Comments above      Pertinent Vitals/Pain Pain Assessment: 0-10 Pain Score: 9  Pain Location: Headache in standing; dicomfort from back and chest tube as well Pain Descriptors / Indicators: Headache Pain Intervention(s): Premedicated before session;Repositioned    Home Living                      Prior Function            PT Goals (current  goals can now be found in the care plan section) Acute Rehab PT Goals Patient Stated Goal: feel better PT Goal Formulation: With patient Time For Goal Achievement: 07/13/20 Potential to Achieve Goals: Good Progress towards PT goals: Progressing toward goals (slowly)    Frequency    Min 5X/week      PT Plan Current plan remains appropriate    Co-evaluation PT/OT/SLP Co-Evaluation/Treatment: Yes Reason for Co-Treatment: To address functional/ADL transfers PT goals addressed during session: Mobility/safety with mobility        AM-PAC PT "6 Clicks" Mobility   Outcome Measure  Help needed turning from your back to your side while in a flat bed without using bedrails?: A Little Help needed moving from lying on your back to sitting on the side of a flat bed without using bedrails?: A Little Help needed moving to and from a bed to a chair (including a wheelchair)?: A Little Help needed standing up from a chair using your arms (e.g., wheelchair or bedside chair)?: A Little Help needed to walk in hospital room?: A Lot Help needed climbing 3-5 steps with a railing? : A Lot 6 Click Score: 16    End of Session   Activity Tolerance: Patient tolerated treatment well;Other (comment) (though with headache and dome dizziness) Patient left: in chair;with call bell/phone within reach;with family/visitor present Nurse Communication: Mobility status PT Visit Diagnosis: Other abnormalities of gait and mobility (R26.89);Pain Pain - part of body:  (Back and Headache)     Time: 1016-1040 PT Time Calculation (min) (ACUTE ONLY): 24 min  Charges:  $Therapeutic Activity: 8-22 mins                     [name], PT  Acute Rehabilitation Services Pager [phone] Office [phone]    [name] 07/01/2020, 12:05 PM

## 2020-07-01 NOTE — Progress Notes (Signed)
Occupational Therapy Treatment Patient Details Name: [name] MRN: [id number] DOB: 24-Sep-1955 Today's Date: 07/01/2020    History of present illness The pt is a 65 yo female presenting s/p anterolateal decompression and fusion of T12-L1 and L1-L2 on 1/06. Pt developed post operative L ptx and is now s/p chest tube insertion. PMH includes: anxiety, arthritis, HTN, vertigo, fibromyalgia, previous lumbar fusion (L3-sacrum), and bilateral TKA.   OT comments  Pt. Seen for skilled therapy session with PT. Pt. Able to complete bed mobility min a.  Sit/stand and stand pivot transfers min a x2 for safety and line management.  Tolerating more mobility this session and agreeable to sit up in the chair through lunch but still c/o severe HA and feeling like she will faint.  Will continue to progress mobilty and adls as able.    Headache and BP  DETAILS:  5/10 seated eob 9/10 standing 3/10 seated in recliner feet up  Supine 112/63 HR 103 Sitting 128/50 HR 106 Standing initial unable to relax arm- 120/103 HR 129 Started in standing but had to sit with relaxed arm-90/50 HR 92 Seated end of session feet up 107/60 HR 88    Follow Up Recommendations  Home health OT    Equipment Recommendations  3 in 1 bedside commode;Other (comment)    Recommendations for Other Services      Precautions / Restrictions Precautions Precautions: Fall;Back Precaution Comments: orthostatic hypotension, L chest tube (on suction)       Mobility Bed Mobility Overal bed mobility: Needs Assistance Bed Mobility: Rolling;Sidelying to Sit Rolling: Min assist Sidelying to sit: Min assist       General bed mobility comments: Reminder cues for log roll technique, which she managed better than previous PT/OT session; tactile cueing to reciprocally scoot to EOB while still maintaining back precautions  Transfers Overall transfer level: Needs assistance Equipment used: Rolling walker (2 wheeled) Transfers: Sit  to/from [employer] Sit to Stand: Min assist;+2 safety/equipment Stand pivot transfers: Min assist;+2 safety/equipment       General transfer comment: Min assist to power up and steady in standing; then min assist, encouragement, and cues for RW to take pivotal steps bed to recliner; second person managing Chest tube    Balance     Sitting balance-Leahy Scale: Fair       Standing balance-Leahy Scale: Poor                             ADL either performed or assessed with clinical judgement   ADL Overall ADL's : Needs assistance/impaired                         Toilet Transfer: Minimal assistance;+2 for safety/equipment;Stand-pivot;RW Toilet Transfer Details (indicate cue type and reason): simulated from eob to recliner approx. 3-4 steps         Functional mobility during ADLs: Minimal assistance;+2 for safety/equipment General ADL Comments: no c/o nausea today but severe HA remains and bp fluctuations leading to fear that she will "pass out".  however, much improved from yesterday. tolerating more standing and able to engage in functional mobility.     Vision       Perception     Praxis      Cognition Arousal/Alertness: Awake/alert Behavior During Therapy: WFL for tasks assessed/performed Overall Cognitive Status: Within Functional Limits for tasks assessed  General Comments: pt alert and oriented, good memory of PT session yesterday. Able to relay needs        Exercises     Shoulder Instructions       General Comments Improved activity tolerance    Pertinent Vitals/ Pain       Pain Assessment: 0-10 Pain Score: 9  Pain Location: Headache in standing; dicomfort from back and chest tube as well Pain Descriptors / Indicators: Headache Pain Intervention(s): Repositioned;Premedicated before session  Home Living                                           Prior Functioning/Environment              Frequency  Min 2X/week        Progress Toward Goals  OT Goals(current goals can now be found in the care plan section)  Progress towards OT goals: Progressing toward goals  Acute Rehab OT Goals Patient Stated Goal: feel better  Plan Discharge plan remains appropriate    Co-evaluation      Reason for Co-Treatment: To address functional/ADL transfers PT goals addressed during session: Mobility/safety with mobility        AM-PAC OT "6 Clicks" Daily Activity     Outcome Measure   Help from another person eating meals?: A Little Help from another person taking care of personal grooming?: A Lot Help from another person toileting, which includes using toliet, bedpan, or urinal?: A Lot Help from another person bathing (including washing, rinsing, drying)?: A Lot Help from another person to put on and taking off regular upper body clothing?: A Lot Help from another person to put on and taking off regular lower body clothing?: A Lot 6 Click Score: 13    End of Session Equipment Utilized During Treatment: Rolling walker  OT Visit Diagnosis: Unsteadiness on feet (R26.81);Pain   Activity Tolerance     Patient Left in chair;with call bell/phone within reach;with family/visitor present   Nurse Communication          Time: 8115-7262 OT Time Calculation (min): 29 min  Charges: OT General Charges $OT Visit: 1 Visit OT Treatments $Self Care/Home Management : 8-22 mins  Boneta Lucks, COTA/L Acute Rehabilitation [phone]   Robet Leu 07/01/2020, 12:09 PM

## 2020-07-01 NOTE — Progress Notes (Signed)
Subjective: The patient is alert and pleasant.  She complains of left-sided pain from the chest tube.  She does not feel she is getting adequate relief presently.  Her husband is at the bedside.  Dr. Vassie Loll is planning to remove the chest tube today.  Objective: Vital signs in last 24 hours: Temp:  [98 F (36.7 C)-98.9 F (37.2 C)] 98 F (36.7 C) (01/09 0700) Pulse Rate:  [83-100] 83 (01/09 0610) Resp:  [15-25] 15 (01/09 0610) BP: (106-123)/(51-62) 123/61 (01/09 0700) SpO2:  [92 %-100 %] 99 % (01/09 0610) Estimated body mass index is 35.3 kg/m as calculated from the following:   Height as of this encounter: 5\' 2"  (1.575 m).   Weight as of this encounter: 87.5 kg.   Intake/Output from previous day: 01/08 0701 - 01/09 0700 In: 600 [P.O.:600] Out: 1950 [Urine:1900; Chest Tube:50] Intake/Output this shift: No intake/output data recorded.  Physical exam the patient is alert and pleasant.  She is moving her lower extremities well.  Lab Results: Recent Labs    06/28/20 1515 06/29/20 0133  WBC  --  11.4*  HGB 10.2* 9.2*  HCT 30.0* 27.8*  PLT  --  170   BMET Recent Labs    06/28/20 1515 06/29/20 0133  NA 139 140  K 3.9 3.9  CL  --  108  CO2  --  25  GLUCOSE  --  131*  BUN  --  13  CREATININE  --  0.81  CALCIUM  --  8.6*    Studies/Results: DG Chest Port 1 View  Result Date: 07/01/2020 CLINICAL DATA:  LEFT pneumothorax. EXAM: PORTABLE CHEST 1 VIEW COMPARISON:  Chest x-rays dated 06/30/2020 and 06/29/2020. FINDINGS: LEFT-sided chest tube is stable in position, coiled at the LEFT lung base. No pneumothorax is seen. Persistent atelectasis at the LEFT costophrenic angle. New thickening/fluid at the RIGHT minor fissure. RIGHT lung is otherwise clear. Heart size and mediastinal contours are grossly stable. IMPRESSION: 1. LEFT-sided chest tube is stable in position. No pneumothorax seen. 2. Persistent atelectasis at the LEFT costophrenic angle. 3. New mild thickening/fluid at the  RIGHT minor fissure, favor small RIGHT pleural effusion extending to the fissure. Electronically Signed   By: 08/27/2020 M.D.   On: 07/01/2020 07:46   DG CHEST PORT 1 VIEW  Result Date: 06/30/2020 CLINICAL DATA:  Chest tube.  Left pneumothorax. EXAM: PORTABLE CHEST 1 VIEW COMPARISON:  June 29, 2020 FINDINGS: The left chest tube is stable. There may be a kink in the chest tube just proximal to the pigtail. No pneumothorax. Cardiomediastinal silhouette is stable. No new infiltrates. Mild atelectasis in the left base. IMPRESSION: 1. Stable left chest tube with no pneumothorax. There may be a kink in the chest tube just proximal to the pigtail. 2. Mild left basilar atelectasis. Electronically Signed   By: July 01, 2020 III M.D   On: 06/30/2020 12:27    Assessment/Plan: Postop day #3: I think she will feel better once the chest tube is removed.  I will adjust her pain medications.  LOS: 3 days     08/28/2020 07/01/2020, 9:50 AM

## 2020-07-01 NOTE — Progress Notes (Addendum)
NAME:  [name], MRN:  [id number], DOB:  10/22/1955, LOS: 3 ADMISSION DATE:  06/28/2020, CONSULTATION DATE:  06/28/20 REFERRING MD:  Dr Danielle Dess, CHIEF COMPLAINT:  Post operative ptx  Brief History   65 yo s/p anterolateral decompression of T12-L and L1-L2 developed post operative L ptx.   History of present illness   65 yo female with pmh anxiety, hypothyroidism, fibromyalgia, gerd, htn, hypothyroidism, rls and previous spine surgery who presented for elective surgery with Dr Danielle Dess to amend her chronic pain and discomfort from spinal stenosis and kyphosis.  Pt describes difficulty breathing. On supplemental oxygen with sats in mid 90's. No cough, ++CP worse on L and across anterior chest. No tachypnea. Groggy from anesthesia  Called emergently for large L PTX noted post operatively  Past Medical History   Past Medical History:  Diagnosis Date  . Anxiety   . Arthritis   . Complication of anesthesia    during surgery she could hear staff talking and could feel them preping her arm over 30 yrs. ago  . Fibromyalgia   . GERD (gastroesophageal reflux disease)   . Hypertension   . Hypothyroidism   . Restless leg syndrome   . Staph infection    2015  . Wears glasses     Significant Hospital Events   1/6: post procedural pneumothorax 1/8 to water seal, urinary retention overnight  Consults:  1/6 ccm  Procedures:  1/6: spinal surgery 1/6: L ct placement  Significant Diagnostic Tests:    Micro Data:    Antimicrobials:  Cefazolin 1/6->  Interim history/subjective:  Able to void No dyspnea Minimal chest pain at tube site. 50 cc drainage   Objective   Blood pressure 123/61, pulse 83, temperature 98 F (36.7 C), resp. rate 15, height 5\' 2"  (1.575 m), weight 87.5 kg, SpO2 99 %.        Intake/Output Summary (Last 24 hours) at 07/01/2020 0940 Last data filed at 07/01/2020 0619 Gross per 24 hour  Intake 600 ml  Output 1950 ml  Net -1350 ml   Filed Weights    06/28/20 0611  Weight: 87.5 kg   Physical Exam: Gen. Pleasant, well-nourished, in no distress ENT - no thrush, no pallor/icterus,no post nasal drip Neck: No JVD, no thyromegaly, no carotid bruits Lungs: Mildly congested, no accessory muscle use, clear breath sounds bilateral Cardiovascular: Rhythm regular, heart sounds  normal, no murmurs or gallops, no peripheral edema Musculoskeletal: No deformities, no cyanosis or clubbing    Chest x-ray 1/9 independently reviewed shows no pneumothorax, left pigtail in position appears kinked Resolved Hospital Problem list     Assessment & Plan:  L PTX:  -suspect 2/2 recent spinal surgery  -Atelectasis resolved, on waterseal, lung appears up on chest x-ray. Can discontinue chest tube Repeat chest x-ray @ 3pm after chest tube removal - no pneumothx  Urinary retention -status post in and out cath. Resolved  Kyphosis and spinal stenosis:  -post operative care per surgery  Pulmonary will be available as needed  Labs   CBC: Recent Labs  Lab 06/26/20 1108 06/28/20 1118 06/28/20 1312 06/28/20 1515 06/29/20 0133  WBC 5.8  --   --   --  11.4*  HGB 13.2 11.6* 11.2* 10.2* 9.2*  HCT 40.5 34.0* 33.0* 30.0* 27.8*  MCV 96.2  --   --   --  94.9  PLT 328  --   --   --  170    Basic Metabolic Panel: Recent Labs  Lab 06/26/20 1108  06/28/20 1118 06/28/20 1312 06/28/20 1515 06/29/20 0133  NA 138 138 139 139 140  K 4.0 3.7 3.7 3.9 3.9  CL 102  --   --   --  108  CO2 28  --   --   --  25  GLUCOSE 97  --   --   --  131*  BUN 12  --   --   --  13  CREATININE 0.89  --   --   --  0.81  CALCIUM 10.0  --   --   --  8.6*   GFR: Estimated Creatinine Clearance: 71.2 mL/min (by C-G formula based on SCr of 0.81 mg/dL). Recent Labs  Lab 06/26/20 1108 06/29/20 0133  WBC 5.8 11.4*    Cyril Mourning MD. FCCP. [facility] See Amion for pager  If no response to pager , please call [phone]  After 7:00 pm call Elink   [phone]   07/01/2020

## 2020-07-02 MED ORDER — CEFAZOLIN SODIUM-DEXTROSE 2-4 GM/100ML-% IV SOLN
2.0000 g | Freq: Three times a day (TID) | INTRAVENOUS | Status: DC
  Administered 2020-07-02 – 2020-07-04 (×6): 2 g via INTRAVENOUS
  Filled 2020-07-02 (×7): qty 100

## 2020-07-02 NOTE — Progress Notes (Signed)
Patient ID: [name], female   DOB: [birth year], 65 y.o.   MRN: [id number] Vital signs are stable Motor function is good Patient is having a fair amount of back soreness Incision is showing some weeping through the blood products Daily dressing changes need to be done Will ask physical therapy and rehab med to see

## 2020-07-02 NOTE — Plan of Care (Signed)
  Problem: Education: Goal: Knowledge of General Education information will improve Description Including pain rating scale, medication(s)/side effects and non-pharmacologic comfort measures Outcome: Progressing   

## 2020-07-02 NOTE — Care Management Important Message (Signed)
Important Message  Patient Details  Name: [name] MRN: [id number] Date of Birth: September 25, 1955   Medicare Important Message Given:  Yes     Treyon Wymore Stefan Church 07/02/2020, 4:09 PM

## 2020-07-02 NOTE — Progress Notes (Signed)
Occupational Therapy Treatment Patient Details Name: [name] MRN: [id number] DOB: 12-15-1955 Today's Date: 07/02/2020    History of present illness The pt is a 65 yo female presenting s/p anterolateal decompression and fusion of T12-L1 and L1-L2 on 1/06. Pt developed post operative L ptx and is now s/p chest tube insertion. PMH includes: anxiety, arthritis, HTN, vertigo, fibromyalgia, previous lumbar fusion (L3-sacrum), and bilateral TKA.   OT comments  Pt seen in conjunction with PT.  She was eager to participate despite feeling nauseas.  Pt able to move to EOB with min A.   She stood x 2 with mod A +2, but limited by orthostasis.  She performed counter pressure maneuvers with minimal improvement in BP, and then transferred to recliner.  Activity significantly limited by orhthostatic hypotension.  Will continue attempts.  VITALS:  - supine - BP: 125/79 (90) - sitting EOB - BP: 119/67 (81) - standing - BP: 97/72 (81) - standing after 4 min - BP: 74/59 (67) - sitting EOB - 97/63 (73) - sitting EOB after 7 min: 76/61 (68) - sitting EOB after counter-pressure maneuvers: 91/56 (68) - sitting in recliner feet elevated: 99/51 (64)   Follow Up Recommendations  Home health OT    Equipment Recommendations  3 in 1 bedside commode;Other (comment)    Recommendations for Other Services      Precautions / Restrictions Precautions Precautions: Fall;Back Precaution Comments: orthostatic hypotension Required Braces or Orthoses: Spinal Brace Spinal Brace: Lumbar corset;Applied in sitting position Restrictions Weight Bearing Restrictions: No       Mobility Bed Mobility Overal bed mobility: Needs Assistance Bed Mobility: Rolling;Sidelying to Sit Rolling: Min assist Sidelying to sit: Min assist;HOB elevated       General bed mobility comments: cues for log roll, pt able to move BLE and elevate trunk from [facility] with minA only  Transfers Overall transfer level: Needs  assistance Equipment used: Rolling walker (2 wheeled);2 person hand held assist Transfers: Sit to/from [employer] Sit to Stand: Min assist;+2 safety/equipment Stand pivot transfers: Mod assist;+2 physical assistance;+2 safety/equipment       General transfer comment: minA +2 to power up and steady, minA of 2 to steady without UE support, modA to complete pivot to recliner with HHA of 2, dizziness and BLE weakness onset with transfer to recliner    Balance Overall balance assessment: Needs assistance Sitting-balance support: Feet supported Sitting balance-Leahy Scale: Fair Sitting balance - Comments: pt prefers UE support, but able to maintain static sitting without physical assist   Standing balance support: Bilateral upper extremity supported Standing balance-Leahy Scale: Poor Standing balance comment: reliant on BUE support and assist to steady in standing                           ADL either performed or assessed with clinical judgement   ADL Overall ADL's : Needs assistance/impaired                         Toilet Transfer: +2 for physical assistance;+2 for safety/equipment;BSC;Squat-pivot;Moderate assistance                   Vision       Perception     Praxis      Cognition Arousal/Alertness: Awake/alert Behavior During Therapy: WFL for tasks assessed/performed Overall Cognitive Status: Within Functional Limits for tasks assessed  General Comments: pt alert and oriented, good memory of PT session yesterday. Able to relay needs        Exercises Exercises: General Lower Extremity;Other exercises General Exercises - Lower Extremity Heel Raises: AROM;Both;15 reps;Seated Other Exercises Other Exercises: counterpressure techniques including crossing legs, hand gripping, and arm tension in attemtps to increas BP   Shoulder Instructions       General Comments BP 125/79 (90)  supine in bed prior to session. Maintained 119/67 (81) in sitting EOB, decreased to 90s/60-70s after standing with low of 74/59 (67) after standing for 4 min. 99/51 (64) sitting in recliner with feet elevated.    Pertinent Vitals/ Pain       Pain Assessment: Faces Faces Pain Scale: Hurts little more Pain Location: back with mobility, then mostly complains of dizziness/nausea Pain Descriptors / Indicators: Discomfort;Sore Pain Intervention(s): Limited activity within patient's tolerance;Monitored during session;Repositioned;RN gave pain meds during session  Home Living                                          Prior Functioning/Environment              Frequency  Min 2X/week        Progress Toward Goals  OT Goals(current goals can now be found in the care plan section)  Progress towards OT goals: Not progressing toward goals - comment (due to orthostasis)  Acute Rehab OT Goals Patient Stated Goal: feel better  Plan Discharge plan remains appropriate    Co-evaluation    PT/OT/SLP Co-Evaluation/Treatment: Yes Reason for Co-Treatment: For patient/therapist safety;To address functional/ADL transfers PT goals addressed during session: Mobility/safety with mobility;Balance OT goals addressed during session: ADL's and self-care      AM-PAC OT "6 Clicks" Daily Activity     Outcome Measure   Help from another person eating meals?: A Little Help from another person taking care of personal grooming?: A Lot Help from another person toileting, which includes using toliet, bedpan, or urinal?: A Lot Help from another person bathing (including washing, rinsing, drying)?: A Lot Help from another person to put on and taking off regular upper body clothing?: A Lot Help from another person to put on and taking off regular lower body clothing?: A Lot 6 Click Score: 13    End of Session Equipment Utilized During Treatment: Rolling walker;Back brace  OT Visit  Diagnosis: Unsteadiness on feet (R26.81);Pain   Activity Tolerance Treatment limited secondary to medical complications (Comment) (orthostasis)   Patient Left in chair;with call bell/phone within reach;with family/visitor present   Nurse Communication Mobility status        Time: 8563-1497 OT Time Calculation (min): 47 min  Charges: OT General Charges $OT Visit: 1 Visit OT Treatments $Therapeutic Activity: 8-22 mins  Eber Jones., OTR/L Acute Rehabilitation Services Pager [phone] Office [phone]    Jeani Hawking M 07/02/2020, 5:57 PM

## 2020-07-02 NOTE — Progress Notes (Signed)
Physical Therapy Treatment Patient Details Name: [name] MRN: [id number] DOB: 1956/04/28 Today's Date: 07/02/2020    History of Present Illness The pt is a 65 yo female presenting s/p anterolateal decompression and fusion of T12-L1 and L1-L2 on 1/06. Pt developed post operative L ptx and is now s/p chest tube insertion. PMH includes: anxiety, arthritis, HTN, vertigo, fibromyalgia, previous lumbar fusion (L3-sacrum), and bilateral TKA. (Simultaneous filing. User may not have seen previous data.)    PT Comments    The pt was in bed, eager to participate in PT session with focus on progressing OOB mobility and activity tolerance. The pt was able to demo good memory of log roll technique, and complete bed mobility with minA. However, the pt continues to have significant limitations in activity tolerance and upright mobility, due to onset of dizziness and nausea with all movement. The pt is able to complete sot-stand transfers with minA, but required modA of 2 to complete transition to recliner due to onset of weakness and dizziness that required increased assist to maintain upright. The pt will continue to benefit from skilled PT to progress functional strength and activity tolerance, but is highly motivated to continue pursuit of d/c home.   VITALS:  - supine - BP: 125/79 (90) - sitting EOB - BP: 119/67 (81) - standing - BP: 97/72 (81) - standing after 4 min - BP: 74/59 (67) - sitting EOB - 97/63 (73) - sitting EOB after 7 min: 76/61 (68) - sitting EOB after counter-pressure maneuvers: 91/56 (68) - sitting in recliner feet elevated: 99/51 (64)    Follow Up Recommendations  Home health PT;Supervision/Assistance - 24 hour     Equipment Recommendations  Rolling walker with 5" wheels;3in1 (PT)    Recommendations for Other Services       Precautions / Restrictions Precautions Precautions: Fall;Back Precaution Comments: orthostatic hypotension Required Braces or Orthoses: Spinal  Brace Spinal Brace: Lumbar corset;Applied in sitting position Restrictions Weight Bearing Restrictions: No    Mobility  Bed Mobility Overal bed mobility: Needs Assistance Bed Mobility: Rolling;Sidelying to Sit Rolling: Min assist Sidelying to sit: Min assist;HOB elevated       General bed mobility comments: cues for log roll, pt able to move BLE and elevate trunk from [facility] with minA only  Transfers Overall transfer level: Needs assistance Equipment used: Rolling walker (2 wheeled);2 person hand held assist Transfers: Sit to/from [employer] Sit to Stand: Min assist;+2 safety/equipment Stand pivot transfers: Mod assist;+2 physical assistance;+2 safety/equipment       General transfer comment: minA +2 to power up and steady, minA of 2 to steady without UE support, modA to complete pivot to recliner with HHA of 2, dizziness and BLE weakness onset with transfer to recliner  Ambulation/Gait Ambulation/Gait assistance: Mod assist;+2 physical assistance Gait Distance (Feet): 3 Feet Assistive device: 2 person hand held assist Gait Pattern/deviations: Step-to pattern;Decreased stride length;Trunk flexed Gait velocity: decreased Gait velocity interpretation: <1.31 ft/sec, indicative of household ambulator General Gait Details: small lateral steps to recliner from EOB, pt with significant LE weakness/buckling with steps requiring increased assist to guide and reach recliner   Stairs             Wheelchair Mobility    Modified Rankin (Stroke Patients Only)       Balance Overall balance assessment: Needs assistance Sitting-balance support: Feet supported Sitting balance-Leahy Scale: Fair Sitting balance - Comments: pt prefers UE support, but able to maintain static sitting without physical assist   Standing  balance support: Bilateral upper extremity supported Standing balance-Leahy Scale: Poor Standing balance comment: reliant on BUE support and assist to  steady in standing                            Cognition Arousal/Alertness: Awake/alert Behavior During Therapy: WFL for tasks assessed/performed Overall Cognitive Status: Within Functional Limits for tasks assessed                                 General Comments: pt alert and oriented, good memory of PT session yesterday. Able to relay needs      Exercises General Exercises - Lower Extremity Heel Raises: AROM;Both;15 reps;Seated Other Exercises Other Exercises: counterpressure techniques including crossing legs, hand gripping, and arm tension in attemtps to increas BP    General Comments General comments (skin integrity, edema, etc.): BP 125/79 (90) supine in bed prior to session. Maintained 119/67 (81) in sitting EOB, decreased to 90s/60-70s after standing with low of 74/59 (67) after standing for 4 min. 99/51 (64) sitting in recliner with feet elevated.      Pertinent Vitals/Pain Pain Assessment: Faces Faces Pain Scale: Hurts little more Pain Location: back with mobility, then mostly complains of dizziness/nausea Pain Descriptors / Indicators: Discomfort;Sore Pain Intervention(s): Limited activity within patient's tolerance;Monitored during session;Repositioned;RN gave pain meds during session    Home Living                      Prior Function            PT Goals (current goals can now be found in the care plan section) Acute Rehab PT Goals Patient Stated Goal: feel better PT Goal Formulation: With patient Time For Goal Achievement: 07/13/20 Potential to Achieve Goals: Good Progress towards PT goals: Progressing toward goals    Frequency    Min 5X/week      PT Plan Current plan remains appropriate    Co-evaluation PT/OT/SLP Co-Evaluation/Treatment: Yes Reason for Co-Treatment: For patient/therapist safety;To address functional/ADL transfers (Simultaneous filing. User may not have seen previous data.) PT goals addressed  during session: Mobility/safety with mobility;Balance OT goals addressed during session: ADL's and self-care      AM-PAC PT "6 Clicks" Mobility   Outcome Measure  Help needed turning from your back to your side while in a flat bed without using bedrails?: A Little Help needed moving from lying on your back to sitting on the side of a flat bed without using bedrails?: A Little Help needed moving to and from a bed to a chair (including a wheelchair)?: A Little Help needed standing up from a chair using your arms (e.g., wheelchair or bedside chair)?: A Little Help needed to walk in hospital room?: A Lot Help needed climbing 3-5 steps with a railing? : A Lot 6 Click Score: 16    End of Session Equipment Utilized During Treatment: Gait belt;Oxygen;Back brace Activity Tolerance: Patient tolerated treatment well (dizzy, nauseous) Patient left: in chair;with call bell/phone within reach;with family/visitor present Nurse Communication: Mobility status PT Visit Diagnosis: Other abnormalities of gait and mobility (R26.89);Pain     Time: 4166-0630 PT Time Calculation (min) (ACUTE ONLY): 41 min  Charges:  $Gait Training: 8-22 mins $Therapeutic Activity: 8-22 mins                     [name], PT, DPT  Acute Rehabilitation Department Pager #: [phone]   [name] 07/02/2020, 5:44 PM

## 2020-07-03 ENCOUNTER — Inpatient Hospital Stay (HOSPITAL_COMMUNITY): Payer: PPO

## 2020-07-03 MED ORDER — KETOROLAC TROMETHAMINE 15 MG/ML IJ SOLN
15.0000 mg | Freq: Four times a day (QID) | INTRAMUSCULAR | Status: AC | PRN
  Administered 2020-07-03 – 2020-07-05 (×2): 15 mg via INTRAMUSCULAR
  Filled 2020-07-03 (×3): qty 1

## 2020-07-03 MED FILL — Sodium Chloride IV Soln 0.9%: INTRAVENOUS | Qty: 1000 | Status: AC

## 2020-07-03 MED FILL — Heparin Sodium (Porcine) Inj 1000 Unit/ML: INTRAMUSCULAR | Qty: 30 | Status: AC

## 2020-07-03 MED FILL — Sodium Chloride Irrigation Soln 0.9%: Qty: 3000 | Status: AC

## 2020-07-03 NOTE — Progress Notes (Signed)
Inpatient Rehabilitation Admissions Coordinator  Inpatient rehab consult received . I met with patient and her spouse at bedside for assessment. We discussed her current functional level and her barriers to discharge home. They state her BP has been an issue due to the pain meds she was receiving. She is now taking 1 percocet. Was able to sit in chair for 4 hrs today and just was bathed. They are hopeful that once her pain decreases and BP remains up, that she hopes to d/c directly home. I will follow her progress.  Danne Baxter, RN, MSN Rehab Admissions Coordinator [phone] 07/03/2020 11:47 AM

## 2020-07-03 NOTE — Progress Notes (Signed)
Patient ID: [name], female   DOB: August 31, 1955, 65 y.o.   MRN: [id number] Vital signs are stable Motor function is intact Slow improvement Complaining of right hip pain Will obtain new x-rays.

## 2020-07-03 NOTE — Progress Notes (Signed)
Physical Therapy Treatment Patient Details Name: [name] MRN: [id number] DOB: 1955-09-06 Today's Date: 07/03/2020    History of Present Illness The pt is a 65 yo female presenting s/p anterolateal decompression and fusion of T12-L1 and L1-L2 on 1/06. Pt developed post operative L ptx and is now s/p chest tube insertion. PMH includes: anxiety, arthritis, HTN, vertigo, fibromyalgia, previous lumbar fusion (L3-sacrum), and bilateral TKA.    PT Comments    The pt was able to demo good progress in activity tolerance and decreased assist needed this morning. She was able to complete multiple sit-stand transfers from various surfaces with minA to power up and steady, as well as cues for technique with lower to recliner. The pt was then able to progress to walking a short distance in the room with minA/minG and RW, but did require seated rest due to onset of dizziness and BP drop from 131/70 (80) in sitting to 84/56 (66) after walking. The pt was able to recover well with seated rest and seated exercises, with decrease in sx and improvement in BP to 118/61 (78). The pt is encouraged by her progress and will continue to benefit from therapies acutely to progress strength, mobility, and stability to allow for pt goal of d/c home with good family support.   VITALS: - sitting BSC (15 min sitting upright): 131/70 (80) - standing post amb: 84/56 (66) - sitting post exercises: 118/61 (78) - sitting in recliner, legs down: 112/70 (80)   Follow Up Recommendations  Home health PT;Supervision/Assistance - 24 hour     Equipment Recommendations  Rolling walker with 5" wheels;3in1 (PT)    Recommendations for Other Services       Precautions / Restrictions Precautions Precautions: Fall;Back Precaution Comments: orthostatic hypotension Required Braces or Orthoses: Spinal Brace Spinal Brace: Lumbar corset;Applied in sitting position Restrictions Weight Bearing Restrictions: No    Mobility  Bed  Mobility Overal bed mobility: Needs Assistance             General bed mobility comments: pt up on BSC at arrival of PT, left in recliner  Transfers Overall transfer level: Needs assistance Equipment used: Rolling walker (2 wheeled) Transfers: Sit to/from [employer] Sit to Stand: Min assist Stand pivot transfers: Min assist       General transfer comment: minA to complete multiple sit-stand transfers during session, able to  complete turn with RW with minG only to steady  Ambulation/Gait Ambulation/Gait assistance: Min assist Gait Distance (Feet): 6 Feet Assistive device: Rolling walker (2 wheeled) Gait Pattern/deviations: Step-to pattern;Decreased stride length;Shuffle;Trunk flexed Gait velocity: decreased   General Gait Details: small steps from [facility] to recliner in room. no assist needed to maintain upright but minA to steady. slowed gait with small steps and minimal clearance.       Balance Overall balance assessment: Needs assistance Sitting-balance support: Feet supported Sitting balance-Leahy Scale: Fair Sitting balance - Comments: pt prefers UE support, but able to maintain static sitting without physical assist   Standing balance support: Bilateral upper extremity supported Standing balance-Leahy Scale: Poor Standing balance comment: reliant on BUE support and assist to steady in standing                            Cognition Arousal/Alertness: Awake/alert Behavior During Therapy: WFL for tasks assessed/performed Overall Cognitive Status: Within Functional Limits for tasks assessed  General Comments: pt alert and oriented, good safety awareness      Exercises General Exercises - Lower Extremity Long Arc Quad: Strengthening;Both;10 reps;Seated (against min resistance) Heel Slides: Strengthening;Both;10 reps;Seated (against min resistance) Heel Raises: AROM;Both;15 reps;Seated     General Comments General comments (skin integrity, edema, etc.): BP 131/70 (80) sitting on BSC (pt reports she had been on [facility] for 15 min prior to arrival of PT). BP drop to 84/56 (66) following short ambulation in room and static standing to allow for BP recording. Improved to 118/61 (78) following return to sitting and seated exercises.      Pertinent Vitals/Pain Pain Assessment: Faces Faces Pain Scale: Hurts a little bit Pain Location: back with mobility, headache Pain Descriptors / Indicators: Discomfort;Sore Pain Intervention(s): Limited activity within patient's tolerance;Monitored during session;Repositioned;Patient requesting pain meds-RN notified           PT Goals (current goals can now be found in the care plan section) Acute Rehab PT Goals Patient Stated Goal: feel better PT Goal Formulation: With patient Time For Goal Achievement: 07/13/20 Potential to Achieve Goals: Good Progress towards PT goals: Progressing toward goals    Frequency    Min 5X/week      PT Plan Current plan remains appropriate       AM-PAC PT "6 Clicks" Mobility   Outcome Measure  Help needed turning from your back to your side while in a flat bed without using bedrails?: A Little Help needed moving from lying on your back to sitting on the side of a flat bed without using bedrails?: A Little Help needed moving to and from a bed to a chair (including a wheelchair)?: A Little Help needed standing up from a chair using your arms (e.g., wheelchair or bedside chair)?: A Little Help needed to walk in hospital room?: A Lot Help needed climbing 3-5 steps with a railing? : A Lot 6 Click Score: 16    End of Session Equipment Utilized During Treatment: Gait belt;Back brace Activity Tolerance: Patient tolerated treatment well Patient left: in chair;with call bell/phone within reach;with chair alarm set;with family/visitor present Nurse Communication: Mobility status PT Visit Diagnosis: Other  abnormalities of gait and mobility (R26.89);Pain     Time: 3382-5053 PT Time Calculation (min) (ACUTE ONLY): 31 min  Charges:  $Gait Training: 8-22 mins $Therapeutic Activity: 8-22 mins                     Rolm Baptise, PT, DPT   Acute Rehabilitation Department Pager #: [phone]   Gaetana Michaelis 07/03/2020, 10:20 AM

## 2020-07-04 ENCOUNTER — Inpatient Hospital Stay (HOSPITAL_COMMUNITY): Payer: PPO

## 2020-07-04 DIAGNOSIS — R1031 Right lower quadrant pain: Secondary | ICD-10-CM

## 2020-07-04 LAB — URINALYSIS, ROUTINE W REFLEX MICROSCOPIC
Bilirubin Urine: NEGATIVE
Glucose, UA: NEGATIVE mg/dL
Hgb urine dipstick: NEGATIVE
Ketones, ur: NEGATIVE mg/dL
Leukocytes,Ua: NEGATIVE
Nitrite: NEGATIVE
Protein, ur: NEGATIVE mg/dL
Specific Gravity, Urine: 1.015 (ref 1.005–1.030)
pH: 6 (ref 5.0–8.0)

## 2020-07-04 LAB — LIPASE, BLOOD: Lipase: 20 U/L (ref 11–51)

## 2020-07-04 LAB — TSH: TSH: 2.404 u[IU]/mL (ref 0.350–4.500)

## 2020-07-04 MED ORDER — RISAQUAD PO CAPS
2.0000 | ORAL_CAPSULE | Freq: Every day | ORAL | Status: DC
  Administered 2020-07-04 – 2020-07-13 (×10): 2 via ORAL
  Filled 2020-07-04 (×10): qty 2

## 2020-07-04 MED ORDER — TRAMADOL HCL 50 MG PO TABS
50.0000 mg | ORAL_TABLET | Freq: Four times a day (QID) | ORAL | Status: DC | PRN
  Administered 2020-07-04: 100 mg via ORAL
  Administered 2020-07-04 – 2020-07-05 (×3): 50 mg via ORAL
  Administered 2020-07-05 – 2020-07-06 (×4): 100 mg via ORAL
  Administered 2020-07-08: 50 mg via ORAL
  Filled 2020-07-04 (×2): qty 1
  Filled 2020-07-04: qty 2
  Filled 2020-07-04: qty 1
  Filled 2020-07-04: qty 2
  Filled 2020-07-04 (×2): qty 1
  Filled 2020-07-04 (×2): qty 2
  Filled 2020-07-04: qty 1
  Filled 2020-07-04: qty 2

## 2020-07-04 MED ORDER — ALUM & MAG HYDROXIDE-SIMETH 200-200-20 MG/5ML PO SUSP
30.0000 mL | ORAL | Status: DC | PRN
  Administered 2020-07-04 – 2020-07-13 (×9): 30 mL via ORAL
  Filled 2020-07-04 (×9): qty 30

## 2020-07-04 NOTE — Progress Notes (Signed)
Physical Therapy Treatment Patient Details Name: [name] MRN: [id number] DOB: 05-27-1956 Today's Date: 07/04/2020    History of Present Illness The pt is a 65 yo female presenting s/p anterolateal decompression and fusion of T12-L1 and L1-L2 on 1/06. Pt developed post operative L ptx and is now s/p chest tube insertion. PMH includes: anxiety, arthritis, HTN, vertigo, fibromyalgia, previous lumbar fusion (L3-sacrum), and bilateral TKA.    PT Comments    Pt not feeling well today, but agreed with encouragement to participate.  Pt asked to try walking only.  Emphasized transitions, scooting, sit to stand and progressing gait with the RW.  Reinforced back care/prec, bed mobility technique, and bracing issues.    Follow Up Recommendations  Home health PT;Supervision/Assistance - 24 hour     Equipment Recommendations  Rolling walker with 5" wheels;3in1 (PT)    Recommendations for Other Services       Precautions / Restrictions Precautions Precautions: Fall;Back Required Braces or Orthoses: Spinal Brace Spinal Brace: Lumbar corset;Applied in sitting position    Mobility  Bed Mobility Overal bed mobility: Needs Assistance Bed Mobility: Rolling;Sidelying to Sit;Sit to Sidelying Rolling: Min guard Sidelying to sit: Min guard     Sit to sidelying: Min assist General bed mobility comments: pt able to roll and transition up without assist, but with minimal use of the rail.  Returned to sidelying with minimal stability assist.  Transfers Overall transfer level: Needs assistance Equipment used: Rolling walker (2 wheeled) Transfers: Sit to/from Stand Sit to Stand: Min assist         General transfer comment: cues for hand placement, stability assist  Ambulation/Gait Ambulation/Gait assistance: Min assist;Min guard Gait Distance (Feet): 100 Feet Assistive device: Rolling walker (2 wheeled) Gait Pattern/deviations: Step-through pattern;Decreased step length -  right;Decreased step length - left;Decreased stride length Gait velocity: decreased Gait velocity interpretation: <1.8 ft/sec, indicate of risk for recurrent falls General Gait Details: Pt taking moderate steps, decreasing as she fatigues and knees start shaking.  Moderate use of the RW.   Stairs             Wheelchair Mobility    Modified Rankin (Stroke Patients Only)       Balance   Sitting-balance support: Feet supported Sitting balance-Leahy Scale: Fair       Standing balance-Leahy Scale: Poor Standing balance comment: reliant on BUE support and assist to steady in standing                            Cognition Arousal/Alertness: Awake/alert Behavior During Therapy: WFL for tasks assessed/performed Overall Cognitive Status: Within Functional Limits for tasks assessed                                        Exercises      General Comments General comments (skin integrity, edema, etc.): Reinforced back prec, log roll, transitions to/from sitting, bracing issues.      Pertinent Vitals/Pain Pain Assessment: Faces Faces Pain Scale: Hurts a little bit Pain Location: back with mobility, Pain Descriptors / Indicators: Discomfort;Sore Pain Intervention(s): Monitored during session    Home Living                      Prior Function            PT Goals (current goals can now be found  in the care plan section) Acute Rehab PT Goals Patient Stated Goal: feel better PT Goal Formulation: With patient Time For Goal Achievement: 07/13/20 Potential to Achieve Goals: Good Progress towards PT goals: Progressing toward goals    Frequency    Min 5X/week      PT Plan Current plan remains appropriate    Co-evaluation              AM-PAC PT "6 Clicks" Mobility   Outcome Measure  Help needed turning from your back to your side while in a flat bed without using bedrails?: A Little Help needed moving from lying on your  back to sitting on the side of a flat bed without using bedrails?: A Little Help needed moving to and from a bed to a chair (including a wheelchair)?: A Little Help needed standing up from a chair using your arms (e.g., wheelchair or bedside chair)?: A Little Help needed to walk in hospital room?: A Little Help needed climbing 3-5 steps with a railing? : A Little 6 Click Score: 18    End of Session   Activity Tolerance: Patient tolerated treatment well Patient left: in bed;with call bell/phone within reach;with nursing/sitter in room Nurse Communication: Mobility status PT Visit Diagnosis: Other abnormalities of gait and mobility (R26.89);Pain     Time: 6761-9509 PT Time Calculation (min) (ACUTE ONLY): 19 min  Charges:  $Gait Training: 8-22 mins                     07/04/2020  Jacinto Halim., PT Acute Rehabilitation Services [phone]  (pager) [phone]  (office)   [name] Fabyan Loughmiller 07/04/2020, 4:29 PM

## 2020-07-04 NOTE — Consult Note (Signed)
[facility] Medical Consultation  [name] XBJ:[id number] DOB: [birth year] DOA: 06/28/2020 PCP: Georgianne Fick, MD   Requesting physician: Neurosurgery Date of consultation: 07/04/2020 Reason for consultation: Abdominal pain  Impression/Recommendations Active Problems:   Status post lumbar spinal fusion   Thoracic spondylosis with myelopathy   Ileus -Symptoms improving, likely secondary to postop narcotic use especially around-the-clock oxycodone which was discontinued this morning.  Lack of physical activity also contribute to the constipation problem.  Patient had 2 small bowel movement this morning and her abdominal pain has somewhat improved.  Physical exam shows still weak bowel sounds, which I expect will continue to improve after discontinue narcotics.  Abdominal x-ray reassuring. -Agreed with continue twice daily Dulcolax, and as needed enema. -TSH  Scoliosis with lumbar spine stenosis status post T12-L2 decompression spinal canal and spinal roots. -Completed empirical antibiotics yesterday. -PT as per primary team  Rheumatoid arthritis -Continue disease modification meds leflunomide and hydroxychloroquine  Hypothyroidism -On high dose of Synthroid, will check TSH.  HTN -Fairly controlled, continue home regimen  I will followup again tomorrow. Please contact me if I can be of assistance in the meanwhile. Thank you for this consultation.  Chief Complaint: [name] hurts  HPI:  65 year old female patient with past medical history of rheumatoid arthritis, lumbar stenosis, hypothyroidism, fibromyalgia, HTN, presented with worsening back pain and status postelective back surgery.  Surgery was done 1 week ago, and since then patient has had significant pain control issues, and had to be placed on wrong clock oxycodone and morphine for the last for 5 days, and patient developed significant constipation since the surgery.  Over the last for 5 days, patient has  developed right lower quadrant abdominal pain, associated with periodic feeling of nausea.  She describes abdominal pain localized, cramping-like, episodic, less severe at night.  This morning, her back pain significantly improved and oxycodone and morphine and Dilaudid discontinued and patient had 2 small bowel movement this morning.  After that, patient reported some more relief of RLQ pain.  Denied any fever, no chest pain no shortness of breath.  Review of Systems:  14 points of review of symptoms negative except those mentioned HPI  Past Medical History:  Diagnosis Date  . Anxiety   . Arthritis   . Complication of anesthesia    during surgery she could hear staff talking and could feel them preping her arm over 30 yrs. ago  . Fibromyalgia   . GERD (gastroesophageal reflux disease)   . Hypertension   . Hypothyroidism   . Restless leg syndrome   . Staph infection    2015  . Wears glasses    Past Surgical History:  Procedure Laterality Date  . ABDOMINAL HYSTERECTOMY  1981   partial  . ANTERIOR LAT LUMBAR FUSION N/A 06/28/2020   Procedure: Thoracic Twelve to Lumbar One, Lumbar One-Two; Anterior lateral fusion, pedicle screws Thoracic Ten to Lumbar Three, Laminectomy Thoracic Twelve--Lumbar Two;  Surgeon: Barnett Abu, MD;  Location: [facility] OR;  Service: Neurosurgery;  Laterality: N/A;  . APPLICATION OF ROBOTIC ASSISTANCE FOR SPINAL PROCEDURE N/A 06/28/2020   Procedure: APPLICATION OF ROBOTIC ASSISTANCE FOR SPINAL PROCEDURE;  Surgeon: Barnett Abu, MD;  Location: [facility] OR;  Service: Neurosurgery;  Laterality: N/A;  . APPLICATION OF WOUND VAC N/A 04/25/2014   Procedure: APPLICATION OF WOUND VAC;  Surgeon: [name], MD;  Location: [facility];  Service: Neurosurgery;  Laterality: N/A;  . BACK SURGERY  2007  . BLADDER SUSPENSION     2004  .  CERVICAL FUSION  1990  . CERVICAL FUSION     2000  . CYST EXCISION Left 1985   wrist  . CYST EXCISION  1985   chest  . FOOT SURGERY Right  2009   reconstruction with hardware  . FOOT SURGERY Left 2013   reconstruction with hardware  . LUMBAR FUSION  03/2014  . LUMBAR PERCUTANEOUS PEDICLE SCREW 4 LEVEL N/A 06/28/2020   Procedure: LUMBAR PERCUTANEOUS PEDICLE SCREW Thoracic Ten to Lumbar Three, Laminectomy Thoracic Twelve--Lumbar Two;  Surgeon: Barnett Abu, MD;  Location: [facility] OR;  Service: Neurosurgery;  Laterality: N/A;  posterior  . LUMBAR WOUND DEBRIDEMENT N/A 04/25/2014   Procedure: LUMBAR WOUND DEBRIDEMENT Placement of Woundvac);  Surgeon: [name], MD;  Location: [facility];  Service: Neurosurgery;  Laterality: N/A;  . PICC LINE PLACE PERIPHERAL ([facility] HX)     2015-infusion therapy secondary to STAPH infection   . TONSILLECTOMY    . TOTAL KNEE ARTHROPLASTY Bilateral 02/27/2015   Procedure: RIGHT TOTAL KNEE ARTHROPLASTY, LEFT KNEE CORTISONE INJECTION;  Surgeon: Durene Romans, MD;  Location: [facility]S;  Service: Orthopedics;  Laterality: Bilateral;  . TOTAL KNEE ARTHROPLASTY Left 06/19/2015   Procedure: LEFT TOTAL KNEE ARTHROPLASTY;  Surgeon: Durene Romans, MD;  Location: [facility] ORS;  Service: Orthopedics;  Laterality: Left;  . TUBAL LIGATION     Social History:  reports that she has never smoked. She has never used smokeless tobacco. She reports that she does not drink alcohol and does not use drugs.  Allergies  Allergen Reactions  . Prednisone Other (See Comments)    Chest pain  . Clindamycin/Lincomycin Rash  . Influenza Vaccines Hives  . Lodine [Etodolac] Rash   History reviewed. No pertinent family history.  Prior to Admission medications   Medication Sig Start Date End Date Taking? Authorizing Provider  acetaminophen (TYLENOL) 650 MG CR tablet Take 1,300 mg by mouth every 8 (eight) hours as needed for pain.   Yes [provider]  ALPRAZolam (XANAX) 0.25 MG tablet Take 0.25 mg by mouth 2 (two) times daily as needed for anxiety or sleep.   Yes [provider]  buPROPion (WELLBUTRIN XL) 150 MG 24 hr  tablet Take 450 mg by mouth daily.   Yes [provider]  esomeprazole (NEXIUM) 20 MG capsule Take 20 mg by mouth daily.    Yes [provider]  hydroxychloroquine (PLAQUENIL) 200 MG tablet Take 200 mg by mouth 2 (two) times daily.   Yes [provider]  leflunomide (ARAVA) 20 MG tablet Take 20 mg by mouth at bedtime.   Yes [provider]  levothyroxine (SYNTHROID) 200 MCG tablet Take 200 mcg by mouth See admin instructions. Take 200 mcg daily except on Saturday take 100 mcg. Skip dose on Sundays.   Yes [provider]  losartan-hydrochlorothiazide (HYZAAR) 100-12.5 MG per tablet Take 1 tablet by mouth every morning.    Yes [provider]  meloxicam (MOBIC) 15 MG tablet Take 15 mg by mouth 2 (two) times daily.   Yes [provider]  pramipexole (MIRAPEX) 0.125 MG tablet Take 0.125 mg by mouth 3 (three) times daily.   Yes [provider]  Probiotic Product (PROBIOTIC PO) Take 1 tablet by mouth every morning.   Yes [provider]  Vitamin D, Ergocalciferol, (DRISDOL) 1.25 MG (50000 UNIT) CAPS capsule Take 50,000 Units by mouth every Sunday. 11/25/19  Yes [provider]   Physical Exam: Blood pressure 137/67, pulse 88, temperature 98.2 F (36.8 C), resp.  rate 16, height  (1.575 m), weight 87.5 kg, SpO2 94 %. Vitals:   07/04/20 1230 07/04/20 1501  BP: 135/67 137/67  Pulse: 86 88  Resp: 16 16  Temp: 98.6 F (37 C) 98.2 F (36.8 C)  SpO2: 97% 94%     General: No acute distress  Eyes: Round  ENT: Mucous membranes moist  Neck: Supple no JVD  Cardiovascular: RRR, no murmurs  Respiratory: Clear breathing sound no crackles no wheezing  Abdomen: Soft, mild tenderness on deep palpation on the right aspect of the periumbilical area, no rebound no guarding.  Decreased bowel sounds  Skin: No rash  Musculoskeletal: Normal ROM  Psychiatric: Calm  Neurologic: No focal deficit  Labs on  Admission:  Basic Metabolic Panel: Recent Labs  Lab 06/28/20 1118 06/28/20 1312 06/28/20 1515 06/29/20 0133  NA 138 139 139 140  K 3.7 3.7 3.9 3.9  CL  --   --   --  108  CO2  --   --   --  25  GLUCOSE  --   --   --  131*  BUN  --   --   --  13  CREATININE  --   --   --  0.81  CALCIUM  --   --   --  8.6*   Liver Function Tests: No results for input(s): AST, ALT, ALKPHOS, BILITOT, PROT, ALBUMIN in the last 168 hours. No results for input(s): LIPASE, AMYLASE in the last 168 hours. No results for input(s): AMMONIA in the last 168 hours. CBC: Recent Labs  Lab 06/28/20 1118 06/28/20 1312 06/28/20 1515 06/29/20 0133  WBC  --   --   --  11.4*  HGB 11.6* 11.2* 10.2* 9.2*  HCT 34.0* 33.0* 30.0* 27.8*  MCV  --   --   --  94.9  PLT  --   --   --  170   Cardiac Enzymes: No results for input(s): CKTOTAL, CKMB, CKMBINDEX, TROPONINI in the last 168 hours. BNP: Invalid input(s): POCBNP CBG: No results for input(s): GLUCAP in the last 168 hours.  Radiological Exams on Admission: DG Lumbar Spine 2-3 Views  Result Date: 07/03/2020 CLINICAL DATA:  Recent lumbar fusion. EXAM: LUMBAR SPINE - 2-3 VIEW COMPARISON:  Preoperative imaging 06/01/2020 FINDINGS: Imaging obtained standing. Extension of lumbar fusion from prior L3 through S1, now extending to the level of T10. Posterior rod with intrapedicular screws. Slight decrease in scoliotic curvature of the upper lumbar spine from preoperative radiograph. Interbody spacers at T12-L1 and L1-L2. Chronic compression fractures of T12, L1, and L2. No evidence of acute fracture. IMPRESSION: 1. Extension of lumbar fusion from prior L3 through S1, now extending to the level of T10. 2. Chronic mild compression fractures of T12, L1, and L2. Electronically Signed   By: Narda Rutherford M.D.   On: 07/03/2020 22:29   DG Pelvis 1-2 Views  Result Date: 07/03/2020 CLINICAL DATA:  Right hip pain.  Pain after recent lumbar fusion. EXAM: PELVIS - 1-2 VIEW  COMPARISON:  None. FINDINGS: The cortical margins of the bony pelvis are intact. No fracture. Pubic symphysis and sacroiliac joints are congruent. Both femoral heads are well-seated in the respective acetabula. IMPRESSION: Unremarkable radiograph of the pelvis.  No explanation for hip pain. Electronically Signed   By: Narda Rutherford M.D.   On: 07/03/2020 22:23   DG Abd Portable 1V  Result Date: 07/04/2020 CLINICAL DATA:  Persistent abdominal pain following surgery 6 days previously EXAM: PORTABLE ABDOMEN -  1 VIEW COMPARISON:  Prior abdominal radiograph 07/03/2020 FINDINGS: Similar postsurgical changes of extensive T10-S1 thoracolumbar sacral posterior fusion with interbody grafts, interbody cages and multilevel lumbar laminectomies. No evidence of bowel obstruction. No large free air on this supine radiograph. No acute osseous abnormality. IMPRESSION: No evidence of bowel obstruction. Electronically Signed   By: Malachy Moan M.D.   On: 07/04/2020 16:17    EKG: None  Time spent: 37 minutes  Emeline General [facility] Pager [phone] 07/04/2020, 5:03 PM

## 2020-07-04 NOTE — Progress Notes (Signed)
Patient ID: [name], female   DOB: April 11, 1956, 65 y.o.   MRN: [id number] Vital signs are stable however patient having significant right lower quadrant pain and pain over the flank on the right side.  Concerned that this could be related to some constipation or antibiotic intolerance.  Patient has had a bowel movement yesterday.  The colon does appear a bit distended on right lower quadrant on her AP pelvis film.  Kidney stone is also a possibility.  We will send off a UA DC her IV antibiotics which we have been using for prophylaxis start a probiotic and also taper her narcotic analgesics which may be contributing to this process.  She did request some Mylanta.  I will ask hospitalist to see her in regards to sorting this process out further.

## 2020-07-04 NOTE — Progress Notes (Addendum)
OT Cancellation Note  Patient Details Name: [name] MRN: [id number] DOB: [birth year]   Cancelled Treatment:    Reason Eval/Treat Not Completed: Medical issues which prohibited therapy;Other (comment) Pt reports nausea stating, " If I sit up right now, I will throw up." Will check back as time allows for OT session pending pt able tolerate OOB activity.   Returned around 1204 to attempt session. Pt continues to report pain however nausea has improved. Pt reports having just gotten situated. Will continue efforts as time allows.   Lenor Derrick., COTA/L Acute Rehabilitation Services [phone]   Barron Schmid 07/04/2020, 8:49 AM

## 2020-07-05 ENCOUNTER — Inpatient Hospital Stay (HOSPITAL_COMMUNITY): Payer: PPO

## 2020-07-05 DIAGNOSIS — Z981 Arthrodesis status: Secondary | ICD-10-CM

## 2020-07-05 DIAGNOSIS — R1031 Right lower quadrant pain: Secondary | ICD-10-CM

## 2020-07-05 LAB — CBC
HCT: 25 % — ABNORMAL LOW (ref 36.0–46.0)
Hemoglobin: 8.8 g/dL — ABNORMAL LOW (ref 12.0–15.0)
MCH: 32.4 pg (ref 26.0–34.0)
MCHC: 35.2 g/dL (ref 30.0–36.0)
MCV: 91.9 fL (ref 80.0–100.0)
Platelets: 320 10*3/uL (ref 150–400)
RBC: 2.72 MIL/uL — ABNORMAL LOW (ref 3.87–5.11)
RDW: 13.4 % (ref 11.5–15.5)
WBC: 5.6 10*3/uL (ref 4.0–10.5)
nRBC: 0 % (ref 0.0–0.2)

## 2020-07-05 LAB — COMPREHENSIVE METABOLIC PANEL
ALT: 11 U/L (ref 0–44)
AST: 27 U/L (ref 15–41)
Albumin: 2.7 g/dL — ABNORMAL LOW (ref 3.5–5.0)
Alkaline Phosphatase: 60 U/L (ref 38–126)
Anion gap: 10 (ref 5–15)
BUN: 7 mg/dL — ABNORMAL LOW (ref 8–23)
CO2: 30 mmol/L (ref 22–32)
Calcium: 8.9 mg/dL (ref 8.9–10.3)
Chloride: 97 mmol/L — ABNORMAL LOW (ref 98–111)
Creatinine, Ser: 0.73 mg/dL (ref 0.44–1.00)
GFR, Estimated: >60 mL/min (ref >60)
Glucose, Bld: 102 mg/dL — ABNORMAL HIGH (ref 70–99)
Potassium: 2.9 mmol/L — ABNORMAL LOW (ref 3.5–5.1)
Sodium: 137 mmol/L (ref 135–145)
Total Bilirubin: 0.8 mg/dL (ref 0.3–1.2)
Total Protein: 5.4 g/dL — ABNORMAL LOW (ref 6.5–8.1)

## 2020-07-05 LAB — C-REACTIVE PROTEIN: CRP: 2.5 mg/dL — ABNORMAL HIGH (ref <1.0)

## 2020-07-05 MED ORDER — MORPHINE SULFATE (PF) 2 MG/ML IV SOLN
2.0000 mg | INTRAVENOUS | Status: DC | PRN
  Administered 2020-07-05 – 2020-07-12 (×26): 2 mg via INTRAVENOUS
  Filled 2020-07-05 (×26): qty 1

## 2020-07-05 MED ORDER — ENOXAPARIN SODIUM 40 MG/0.4ML ~~LOC~~ SOLN
40.0000 mg | SUBCUTANEOUS | Status: DC
  Administered 2020-07-05 – 2020-07-12 (×8): 40 mg via SUBCUTANEOUS
  Filled 2020-07-05 (×8): qty 0.4

## 2020-07-05 MED ORDER — POTASSIUM CHLORIDE 10 MEQ/100ML IV SOLN
10.0000 meq | INTRAVENOUS | Status: AC
Start: 2020-07-05 — End: 2020-07-05
  Administered 2020-07-05 (×7): 10 meq via INTRAVENOUS
  Filled 2020-07-05 (×6): qty 100

## 2020-07-05 MED ORDER — IOHEXOL 300 MG/ML  SOLN
100.0000 mL | Freq: Once | INTRAMUSCULAR | Status: AC | PRN
  Administered 2020-07-05: 100 mL via INTRAVENOUS

## 2020-07-05 MED ORDER — DOXYCYCLINE HYCLATE 100 MG PO TABS
100.0000 mg | ORAL_TABLET | Freq: Two times a day (BID) | ORAL | Status: DC
  Administered 2020-07-05 – 2020-07-13 (×16): 100 mg via ORAL
  Filled 2020-07-05 (×18): qty 1

## 2020-07-05 MED ORDER — PANTOPRAZOLE SODIUM 40 MG IV SOLR
40.0000 mg | Freq: Two times a day (BID) | INTRAVENOUS | Status: DC
  Administered 2020-07-05 – 2020-07-08 (×8): 40 mg via INTRAVENOUS
  Filled 2020-07-05 (×8): qty 40

## 2020-07-05 NOTE — Progress Notes (Signed)
ANTICOAGULATION CONSULT NOTE   Pharmacy Consult for Enoxaparin Indication: VTE prophylaxis  Allergies  Allergen Reactions  . Prednisone Other (See Comments)    Chest pain  . Clindamycin/Lincomycin Rash  . Influenza Vaccines Hives  . Lodine [Etodolac] Rash    Patient Measurements: Height: 5\' 2"  (157.5 cm) Weight: 87.5 kg (193 lb) IBW/kg (Calculated) : 50.1   Vital Signs: Temp: 97.7 F (36.5 C) (01/13 1605) BP: 137/73 (01/13 1605) Pulse Rate: 85 (01/13 1605)  Labs: Recent Labs    07/05/20 0944 07/05/20 1005  HGB 8.8*  --   HCT 25.0*  --   PLT 320  --   CREATININE  --  0.73    Estimated Creatinine Clearance: 72.1 mL/min (by C-G formula based on SCr of 0.73 mg/dL).   Medical History: Past Medical History:  Diagnosis Date  . Anxiety   . Arthritis   . Complication of anesthesia    during surgery she could hear staff talking and could feel them preping her arm over 30 yrs. ago  . Fibromyalgia   . GERD (gastroesophageal reflux disease)   . Hypertension   . Hypothyroidism   . Restless leg syndrome   . Staph infection    2015  . Wears glasses     Assessment: 65 yr old female S/P laminectomy on 06/29/19. Pt is now 1 week out from surgery. Pharmacy was consulted for enoxaparin dosing for VTE prophylaxis. Per Dr 08/27/19 note, pt had some bloody drainage from surgical incision earlier today, but site is dry and clean at this time.  Weight: 87.5 kg, H/H 8.8/25.0, platelets 320; Scr 0.73, TBW CrCl >100 ml/min  Goal of Therapy:  Prevention of VTE prophylaxis Monitor platelets by anticoagulation protocol: Yes   Plan:  Enoxaparin 40 mg SQ daily, starting this evening  Monitor CBC Monitor for signs/symptoms of bleeding  10-28-2001, PharmD, BCPS, [facility] Clinical Pharmacist 07/05/2020,7:35 PM

## 2020-07-05 NOTE — Progress Notes (Signed)
Pt with severe right sided abdominal pain throughout the night unrelieved by pain medication. Pt in tears this morning stating that pain is severe and 8/10. Pt is also having a moderate-large amount of serosanguinous drainage coming from lower part of back incision. RN placed pressure with gauze and covered with medipore tape. Pt also educated to lie on back to keep pressure on area. [facility] messaging service has been contacted and agrees to send message to Dr. Danielle Dess.  Robina Ade, RN

## 2020-07-05 NOTE — Progress Notes (Addendum)
Dr. Hanley Ben, [facility], at bedside. New orders for IV Protonix, Morphine, and STAT abdominal CT.    Call also returned from Dr. Danielle Dess. Will continue to monitor drainage output and place ABD pads as needed. Verbal orders for BMET and CBC.   Robina Ade, RN

## 2020-07-05 NOTE — Progress Notes (Signed)
Patient ID: [name], female   DOB: 08-19-1955, 65 y.o.   MRN: [id number] Vital signs are stable, no fever. Patient had severe episode of pain and drainage from wound this morning when she got up to use the toilet.  Drainage from wound was substantial bloody and coming from the bottom portion of the incision.  Incision currently appears dry and clean.  The drainage was accompanied with a severe headache.  Patient has been at bedrest all day and the incision itself appears to be quite clean and dry right now.  I have redressed the incision myself and note that the incision appears clean and dry.  We will continue to observe her at bedrest today I will change dressing tomorrow.  CT of the abdomen is nonrevealing for any discrete pathology other than some thickening of the terminal ileum.  Diet has been started.  She is now 1 week out from surgery.  I will start her on Lovenox DVT prophylaxis.  Also will give her some doxycycline for antibiotic coverage while wound has some drainage.

## 2020-07-05 NOTE — Progress Notes (Signed)
Pts pain not well controlled through the night. RN gave pt all of her PRN medications when available. @0025  pt requested on 50 mg of tramadol. @0637  100 mg of tramadol was given. Pt also received PRN Tylenol, Robaxin, Mylanta, Xanax, Zofran and Toradol. Pt stated they worked for a short amount of time.

## 2020-07-05 NOTE — Progress Notes (Signed)
PT Cancellation Note  Patient Details Name: [name] MRN: [id number] DOB: 1955/10/12   Cancelled Treatment:    Reason Eval/Treat Not Completed: Medical issues which prohibited therapy.  Pt had a copious amount of fluid leakage from her wound associated by sudden onset of severe HA.  Now on bedrest , CT scheduled.  Will Hold therapy today.   07/05/2020  Jacinto Halim., PT Acute Rehabilitation Services [phone]  (pager) [phone]  (office)   [name] Franciso Dierks 07/05/2020, 11:23 AM

## 2020-07-05 NOTE — Progress Notes (Addendum)
RN called into the room emergently by pt and husband. Pt had attempted to get up to bedside commode and is now having a severe new onset headache that is 10/10 pain. Newly placed dressing on wound is soaked through with a very large puddle of serosanguinous drainage on the floor under the bedside commode. Pt given 2 mg IV Morphine and moved back to bed. Pt educated to lie flat. After these interventions headache resolved to 7 or 8/10 per pt. Pt also educated to start drinking contrast dye for CT. Message again left with [facility] for Dr. Danielle Dess regarding new onset headache/copious drainage.  Robina Ade, RN

## 2020-07-05 NOTE — Progress Notes (Signed)
Patient ID: [name], female   DOB: 13-Sep-1955, 65 y.o.   MRN: [id number]  PROGRESS NOTE    [name]  JXB:[id number] DOB: 01-19-1956 DOA: 06/28/2020 PCP: Georgianne Fick, MD   Brief Narrative:  65 year old female patient with past medical history of rheumatoid arthritis, lumbar stenosis, hypothyroidism, fibromyalgia, HTN, presented with worsening back pain and underwent elective back surgery by neurosurgery on 06/28/2020.  TRH was consulted on 07/04/2020 because of worsening abdominal pain.  Assessment & Plan:   Abdominal pain -Patient continues to have right lower quadrant abdominal pain and currently complains of severe pain.  X-ray abdomen on 07/04/2020 did not show any evidence of obstruction. -She had a good bowel movement yesterday evening. -No new labs recently.  Will order stat labs including CBC, CMP.  Lipase was normal on 07/04/2020.  CT of the abdomen and pelvis with contrast stat. -N.p.o. except for medications for now.  Continue laxatives.  Scoliosis with lumbar spinal stenosis status post T12-L2 decompression -Nursing staff reports increased discharge from the surgical site.  Follow-up with primary neurosurgical team.  Patient apparently completed empirical antibiotics on 07/04/2020.  Rheumatoid arthritis -Continue home regimen.  Outpatient follow-up with rheumatology  Hypertension -Blood pressure stable.  Continue losartan and hydrochlorothiazide  Hypothyroidism -Continue levothyroxine  Subjective: Patient seen and examined at bedside.  Husband present at bedside.  Patient complains of severe right lower quadrant pain with intermittent nausea.  Nursing staff reports increased drainage from surgical site at the back.  Objective: Vitals:   07/04/20 2045 07/05/20 0000 07/05/20 0400 07/05/20 0741  BP: (!) 126/57  (!) 143/83 126/68  Pulse: 89  (!) 105 90  Resp: Temp:  98.3 F (36.8 C) 97.6 F (36.4 C) 98 F (36.7 C)  TempSrc:  Oral Oral   SpO2:  96%  97% 97%  Weight:      Height:        Intake/Output Summary (Last 24 hours) at 07/05/2020 0956 Last data filed at 07/05/2020 0000 Gross per 24 hour  Intake 3 ml  Output 601 ml  Net -598 ml   Filed Weights   06/28/20 0611  Weight: 87.5 kg    Examination:  General exam: Appears to be in mild to moderate distress secondary to pain Respiratory system: Bilateral decreased breath sounds at bases with some scattered crackles Cardiovascular system: S1 & S2 heard, intermittently tachycardic  gastrointestinal system: Abdomen is slightly distended, soft and tender mostly in the right lower quadrant.  No rebound tenderness.  Normal bowel sounds heard. Extremities: No cyanosis, clubbing; trace lower extremity edema Central nervous system: Alert and oriented. No focal neurological deficits. Moving extremities Skin: No obvious ecchymosis Psychiatry: Looks anxious    Data Reviewed: I have personally reviewed following labs and imaging studies  CBC: Recent Labs  Lab 06/28/20 1118 06/28/20 1312 06/28/20 1515 06/29/20 0133  WBC  --   --   --  11.4*  HGB 11.6* 11.2* 10.2* 9.2*  HCT 34.0* 33.0* 30.0* 27.8*  MCV  --   --   --  94.9  PLT  --   --   --  170   Basic Metabolic Panel: Recent Labs  Lab 06/28/20 1118 06/28/20 1312 06/28/20 1515 06/29/20 0133  NA 138 139 139 140  K 3.7 3.7 3.9 3.9  CL  --   --   --  108  CO2  --   --   --  25  GLUCOSE  --   --   --  131*  BUN  --   --   --  13  CREATININE  --   --   --  0.81  CALCIUM  --   --   --  8.6*   GFR: Estimated Creatinine Clearance: 71.2 mL/min (by C-G formula based on SCr of 0.81 mg/dL). Liver Function Tests: No results for input(s): AST, ALT, ALKPHOS, BILITOT, PROT, ALBUMIN in the last 168 hours. Recent Labs  Lab 07/04/20 1751  LIPASE 20   No results for input(s): AMMONIA in the last 168 hours. Coagulation Profile: No results for input(s): INR, PROTIME in the last 168 hours. Cardiac Enzymes: No results for  input(s): CKTOTAL, CKMB, CKMBINDEX, TROPONINI in the last 168 hours. BNP (last 3 results) No results for input(s): PROBNP in the last 8760 hours. HbA1C: No results for input(s): HGBA1C in the last 72 hours. CBG: No results for input(s): GLUCAP in the last 168 hours. Lipid Profile: No results for input(s): CHOL, HDL, LDLCALC, TRIG, CHOLHDL, LDLDIRECT in the last 72 hours. Thyroid Function Tests: Recent Labs    07/04/20 1538  TSH 2.404   Anemia Panel: No results for input(s): VITAMINB12, FOLATE, FERRITIN, TIBC, IRON, RETICCTPCT in the last 72 hours. Sepsis Labs: No results for input(s): PROCALCITON, LATICACIDVEN in the last 168 hours.  Recent Results (from the past 240 hour(s))  Surgical pcr screen     Status: None   Collection Time: 06/26/20 11:08 AM   Specimen: Nasal Mucosa; Nasal Swab  Result Value Ref Range Status   MRSA, PCR NEGATIVE NEGATIVE Final   Staphylococcus aureus NEGATIVE NEGATIVE Final    Comment: (NOTE) The Xpert SA Assay (FDA approved for NASAL specimens in patients 31 years of age and older), is one component of a comprehensive surveillance program. It is not intended to diagnose infection nor to guide or monitor treatment. Performed at [facility], [address], [place], [place] [address]   SARS CORONAVIRUS 2 (TAT 6-24 HRS) Nasopharyngeal Nasopharyngeal Swab     Status: None   Collection Time: 06/26/20  2:15 PM   Specimen: Nasopharyngeal Swab  Result Value Ref Range Status   SARS Coronavirus 2 NEGATIVE NEGATIVE Final    Comment: (NOTE) SARS-CoV-2 target nucleic acids are NOT DETECTED.  The SARS-CoV-2 RNA is generally detectable in upper and lower respiratory specimens during the acute phase of infection. Negative results do not preclude SARS-CoV-2 infection, do not rule out co-infections with other pathogens, and should not be used as the sole basis for treatment or other patient management decisions. Negative results must be combined with  clinical observations, patient history, and epidemiological information. The expected result is Negative.  Fact Sheet for Patients: HairSlick.no  Fact Sheet for Healthcare Providers: quierodirigir.com  This test is not yet approved or cleared by the [place] FDA and  has been authorized for detection and/or diagnosis of SARS-CoV-2 by FDA under an Emergency Use Authorization (EUA). This EUA will remain  in effect (meaning this test can be used) for the duration of the COVID-19 declaration under Se ction 564(b)(1) of the Act, 21 U.S.C. section 360bbb-3(b)(1), unless the authorization is terminated or revoked sooner.  Performed at [facility], [address] [place], [place] [address]          Radiology Studies: DG Lumbar Spine 2-3 Views  Result Date: 07/03/2020 CLINICAL DATA:  Recent lumbar fusion. EXAM: LUMBAR SPINE - 2-3 VIEW COMPARISON:  Preoperative imaging 06/01/2020 FINDINGS: Imaging obtained standing. Extension of lumbar fusion from prior L3  through S1, now extending to the level of T10. Posterior rod with intrapedicular screws. Slight decrease in scoliotic curvature of the upper lumbar spine from preoperative radiograph. Interbody spacers at T12-L1 and L1-L2. Chronic compression fractures of T12, L1, and L2. No evidence of acute fracture. IMPRESSION: 1. Extension of lumbar fusion from prior L3 through S1, now extending to the level of T10. 2. Chronic mild compression fractures of T12, L1, and L2. Electronically Signed   By: Narda Rutherford M.D.   On: 07/03/2020 22:29   DG Pelvis 1-2 Views  Result Date: 07/03/2020 CLINICAL DATA:  Right hip pain.  Pain after recent lumbar fusion. EXAM: PELVIS - 1-2 VIEW COMPARISON:  None. FINDINGS: The cortical margins of the bony pelvis are intact. No fracture. Pubic symphysis and sacroiliac joints are congruent. Both femoral heads are well-seated in the respective acetabula.  IMPRESSION: Unremarkable radiograph of the pelvis.  No explanation for hip pain. Electronically Signed   By: Narda Rutherford M.D.   On: 07/03/2020 22:23   DG Abd Portable 1V  Result Date: 07/04/2020 CLINICAL DATA:  Persistent abdominal pain following surgery 6 days previously EXAM: PORTABLE ABDOMEN - 1 VIEW COMPARISON:  Prior abdominal radiograph 07/03/2020 FINDINGS: Similar postsurgical changes of extensive T10-S1 thoracolumbar sacral posterior fusion with interbody grafts, interbody cages and multilevel lumbar laminectomies. No evidence of bowel obstruction. No large free air on this supine radiograph. No acute osseous abnormality. IMPRESSION: No evidence of bowel obstruction. Electronically Signed   By: Malachy Moan M.D.   On: 07/04/2020 16:17        Scheduled Meds: . acidophilus  2 capsule Oral Daily  . buPROPion  450 mg Oral Daily  . Chlorhexidine Gluconate Cloth  6 each Topical Daily  . docusate sodium  100 mg Oral BID  . losartan  100 mg Oral Daily   And  . hydrochlorothiazide  12.5 mg Oral Daily  . leflunomide  20 mg Oral QHS  . levothyroxine  100 mcg Oral Q Sat  . levothyroxine  200 mcg Oral Once per day on Mon Tue Wed Thu Fri  . pantoprazole (PROTONIX) IV  40 mg Intravenous Q12H  . pramipexole  0.125 mg Oral TID  . senna  1 tablet Oral BID  . silver sulfADIAZINE   Topical BID  . sodium chloride flush  3 mL Intravenous Q12H   Continuous Infusions: . sodium chloride    . lactated ringers 50 mL/hr at 06/30/20 1457  . methocarbamol (ROBAXIN) IV            Glade Lloyd, MD [facility] 07/05/2020, 9:56 AM

## 2020-07-06 DIAGNOSIS — R1031 Right lower quadrant pain: Secondary | ICD-10-CM | POA: Diagnosis not present

## 2020-07-06 DIAGNOSIS — Z981 Arthrodesis status: Secondary | ICD-10-CM | POA: Diagnosis not present

## 2020-07-06 LAB — CBC
HCT: 27.4 % — ABNORMAL LOW (ref 36.0–46.0)
Hemoglobin: 9.1 g/dL — ABNORMAL LOW (ref 12.0–15.0)
MCH: 31.2 pg (ref 26.0–34.0)
MCHC: 33.2 g/dL (ref 30.0–36.0)
MCV: 93.8 fL (ref 80.0–100.0)
Platelets: 332 10*3/uL (ref 150–400)
RBC: 2.92 MIL/uL — ABNORMAL LOW (ref 3.87–5.11)
RDW: 13.6 % (ref 11.5–15.5)
WBC: 7 10*3/uL (ref 4.0–10.5)
nRBC: 0 % (ref 0.0–0.2)

## 2020-07-06 NOTE — Progress Notes (Signed)
Inpatient Rehabilitation Admissions Coordinator   Noted medical issues. I will follow up next week.  Ottie Glazier, RN, MSN Rehab Admissions Coordinator [phone] 07/06/2020 12:58 PM

## 2020-07-06 NOTE — Progress Notes (Signed)
Patient ID: [name], female   DOB: 03-27-1956, 65 y.o.   MRN: [id number] Vital signs are stable Dressing appears dry and no further drainage has been noted Currently patient is at bedrest and I have advised that bedrest should continue through the weekend Dressing changes should be on a daily basis painting the incision with Betadine We will start mobilization on Monday Patient can have the head of bed up just 1 or 2 inches to help assist with eating but otherwise flat in bed

## 2020-07-06 NOTE — Progress Notes (Signed)
Patient ID: [name], female   DOB: 08/30/1955, 65 y.o.   MRN: [id number]  PROGRESS NOTE    [name]  HUD:[id number] DOB: 1956-03-18 DOA: 06/28/2020 PCP: Georgianne Fick, MD   Brief Narrative:  65 year old female patient with past medical history of rheumatoid arthritis, lumbar stenosis, hypothyroidism, fibromyalgia, HTN, presented with worsening back pain and underwent elective back surgery by neurosurgery on 06/28/2020.  TRH was consulted on 07/04/2020 because of worsening abdominal pain.  Assessment & Plan:   Abdominal pain -X-ray abdomen on 07/04/2020 did not show any evidence of obstruction.  She had severe abdominal pain on 07/05/2020. -Lipase was normal on 07/04/2020.  CT of the abdomen and pelvis with contrast on 07/05/2020 showed no acute findings in the abdomen except for nonspecific thickening of the distal ileum.  Subsequently diet was resumed. -Currently on IV Protonix twice a day. -Continue laxatives status/stool softeners to prevent constipation. -Abdominal pain improving today.  Scoliosis with lumbar spinal stenosis status post T12-L2 decompression -Follow-up with primary neurosurgical team.  Patient was put on doxycycline by neurosurgery on 07/05/2020 for drainage from the surgical site  Rheumatoid arthritis -Continue home regimen.  Outpatient follow-up with rheumatology  Hypertension -Blood pressure stable.  Continue losartan and hydrochlorothiazide  Hypothyroidism -Continue levothyroxine  Subjective: Patient seen and examined at bedside.  States that her abdominal pain is improving.  She has intermittent nausea.  No overnight vomiting or diarrhea or chest pain. Objective: Vitals:   07/05/20 1605 07/05/20 1951 07/06/20 0007 07/06/20 0339  BP: 137/73 128/63 119/69 128/65  Pulse: 85 88 86 90  Resp: 18     Temp: 97.7 F (36.5 C) 98.5 F (36.9 C) 98.9 F (37.2 C) 98 F (36.7 C)  TempSrc:  Oral Oral Oral  SpO2: 97% 97%  98%  Weight:      Height:         Intake/Output Summary (Last 24 hours) at 07/06/2020 0734 Last data filed at 07/06/2020 0600 Gross per 24 hour  Intake 240 ml  Output 1300 ml  Net -1060 ml   Filed Weights   06/28/20 0611  Weight: 87.5 kg    Examination:  General exam: No acute distress.  Chronically ill looking.   Respiratory system: Decreased breath sounds at bases bilaterally, no wheezing cardiovascular system: Rate controlled, S1-S2 heard  gastrointestinal system: Abdomen is nondistended, soft, very minimally tender in the right lower quadrant.  Bowel sounds are heard Extremities: Mild lower extremity edema present.  No clubbing  Central nervous system: Awake and alert.  No focal neurological deficits.  Moves extremities Skin: No obvious petechiae  psychiatry: Anxious looking    Data Reviewed: I have personally reviewed following labs and imaging studies  CBC: Recent Labs  Lab 07/05/20 0944 07/06/20 0345  WBC 5.6 7.0  HGB 8.8* 9.1*  HCT 25.0* 27.4*  MCV 91.9 93.8  PLT 320 332   Basic Metabolic Panel: Recent Labs  Lab 07/05/20 1005  NA 137  K 2.9*  CL 97*  CO2 30  GLUCOSE 102*  BUN 7*  CREATININE 0.73  CALCIUM 8.9   GFR: Estimated Creatinine Clearance: 72.1 mL/min (by C-G formula based on SCr of 0.73 mg/dL). Liver Function Tests: Recent Labs  Lab 07/05/20 1005  AST 27  ALT 11  ALKPHOS 60  BILITOT 0.8  PROT 5.4*  ALBUMIN 2.7*   Recent Labs  Lab 07/04/20 1751  LIPASE 20   No results for input(s): AMMONIA in the last 168 hours. Coagulation Profile: No results for  input(s): INR, PROTIME in the last 168 hours. Cardiac Enzymes: No results for input(s): CKTOTAL, CKMB, CKMBINDEX, TROPONINI in the last 168 hours. BNP (last 3 results) No results for input(s): PROBNP in the last 8760 hours. HbA1C: No results for input(s): HGBA1C in the last 72 hours. CBG: No results for input(s): GLUCAP in the last 168 hours. Lipid Profile: No results for input(s): CHOL, HDL, LDLCALC, TRIG,  CHOLHDL, LDLDIRECT in the last 72 hours. Thyroid Function Tests: Recent Labs    07/04/20 1538  TSH 2.404   Anemia Panel: No results for input(s): VITAMINB12, FOLATE, FERRITIN, TIBC, IRON, RETICCTPCT in the last 72 hours. Sepsis Labs: No results for input(s): PROCALCITON, LATICACIDVEN in the last 168 hours.  Recent Results (from the past 240 hour(s))  Surgical pcr screen     Status: None   Collection Time: 06/26/20 11:08 AM   Specimen: Nasal Mucosa; Nasal Swab  Result Value Ref Range Status   MRSA, PCR NEGATIVE NEGATIVE Final   Staphylococcus aureus NEGATIVE NEGATIVE Final    Comment: (NOTE) The Xpert SA Assay (FDA approved for NASAL specimens in patients 55 years of age and older), is one component of a comprehensive surveillance program. It is not intended to diagnose infection nor to guide or monitor treatment. Performed at [facility], [address] [place], [place] [address]   SARS CORONAVIRUS 2 (TAT 6-24 HRS) Nasopharyngeal Nasopharyngeal Swab     Status: None   Collection Time: 06/26/20  2:15 PM   Specimen: Nasopharyngeal Swab  Result Value Ref Range Status   SARS Coronavirus 2 NEGATIVE NEGATIVE Final    Comment: (NOTE) SARS-CoV-2 target nucleic acids are NOT DETECTED.  The SARS-CoV-2 RNA is generally detectable in upper and lower respiratory specimens during the acute phase of infection. Negative results do not preclude SARS-CoV-2 infection, do not rule out co-infections with other pathogens, and should not be used as the sole basis for treatment or other patient management decisions. Negative results must be combined with clinical observations, patient history, and epidemiological information. The expected result is Negative.  Fact Sheet for Patients: HairSlick.no  Fact Sheet for Healthcare Providers: quierodirigir.com  This test is not yet approved or cleared by the [place] FDA and  has  been authorized for detection and/or diagnosis of SARS-CoV-2 by FDA under an Emergency Use Authorization (EUA). This EUA will remain  in effect (meaning this test can be used) for the duration of the COVID-19 declaration under Se ction 564(b)(1) of the Act, 21 U.S.C. section 360bbb-3(b)(1), unless the authorization is terminated or revoked sooner.  Performed at [facility], [address], [place], [place] [address]          Radiology Studies: CT ABDOMEN PELVIS W CONTRAST  Result Date: 07/05/2020 CLINICAL DATA:  Recent back surgery with some drainage. Abdominal pain. EXAM: CT ABDOMEN AND PELVIS WITH CONTRAST TECHNIQUE: Multidetector CT imaging of the abdomen and pelvis was performed using the standard protocol following bolus administration of intravenous contrast. CONTRAST:  OMNIPAQUE IOHEXOL 300 MG/ML  SOLN COMPARISON:  None. FINDINGS: Lower chest: Small LEFT pleural effusion and moderate LEFT basilar atelectasis. Hepatobiliary: No focal hepatic lesion. No biliary duct dilatation. Common bile duct is normal. Pancreas: Pancreas is normal. No ductal dilatation. No pancreatic inflammation. Spleen: Normal spleen Adrenals/urinary tract: Adrenal glands and kidneys are normal. The ureters and bladder normal. Stomach/Bowel: Stomach, duodenum small-bowel normal. Mild bowel wall thickening in the distal ileum leading up the terminal ileum (image 57/3). Ascending, transverse and  descending colon normal. Rectum normal Vascular/Lymphatic: Abdominal aorta is normal caliber with atherosclerotic calcification. There is no retroperitoneal or periportal lymphadenopathy. No pelvic lymphadenopathy. Reproductive: Post hysterectomy Other: Gas in the subcutaneous tissue along the LEFT lateral abdominal wall (image series 3). Musculoskeletal: Multilevel posterior thoracolumbar fusion. Multilevel disc posterior decompression of the lumbar spine. fluid within the decompression site without evidence of  enhancement. Small linear fluid collection within the subcutaneous tissue posterior posterior RIGHT midline parallels the decompression (image 28/3. IMPRESSION: 1. Dense LEFT basilar atelectasis and small effusion. 2. No acute findings in the abdomen. 3. Thickening of the distal ileum is nonspecific. Recommend correlation with inflammatory bowel disease. 4. Posterior thoracolumbar fusion and decompression. Fluid collection at the decompression site and subcutaneous tissue without specific complicating features. 5. Gas in the subcutaneous tissue of the LEFT flank presumably related to recent surgery Electronically Signed   By: Genevive Bi M.D.   On: 07/05/2020 13:59   DG Abd Portable 1V  Result Date: 07/04/2020 CLINICAL DATA:  Persistent abdominal pain following surgery 6 days previously EXAM: PORTABLE ABDOMEN - 1 VIEW COMPARISON:  Prior abdominal radiograph 07/03/2020 FINDINGS: Similar postsurgical changes of extensive T10-S1 thoracolumbar sacral posterior fusion with interbody grafts, interbody cages and multilevel lumbar laminectomies. No evidence of bowel obstruction. No large free air on this supine radiograph. No acute osseous abnormality. IMPRESSION: No evidence of bowel obstruction. Electronically Signed   By: Malachy Moan M.D.   On: 07/04/2020 16:17        Scheduled Meds: . acidophilus  2 capsule Oral Daily  . buPROPion  450 mg Oral Daily  . Chlorhexidine Gluconate Cloth  6 each Topical Daily  . docusate sodium  100 mg Oral BID  . doxycycline  100 mg Oral Q12H  . enoxaparin (LOVENOX) injection  40 mg Subcutaneous Q24H  . losartan  100 mg Oral Daily   And  . hydrochlorothiazide  12.5 mg Oral Daily  . leflunomide  20 mg Oral QHS  . levothyroxine  100 mcg Oral Q Sat  . levothyroxine  200 mcg Oral Once per day on Mon Tue Wed Thu Fri  . pantoprazole (PROTONIX) IV  40 mg Intravenous Q12H  . pramipexole  0.125 mg Oral TID  . senna  1 tablet Oral BID  . silver sulfADIAZINE    Topical BID  . sodium chloride flush  3 mL Intravenous Q12H   Continuous Infusions: . sodium chloride    . lactated ringers 50 mL/hr at 06/30/20 1457  . methocarbamol (ROBAXIN) IV            Glade Lloyd, MD [facility] 07/06/2020, 7:34 AM

## 2020-07-06 NOTE — Progress Notes (Signed)
PT Cancellation Note  Patient Details Name: [name] MRN: [id number] DOB: [birth year]   Cancelled Treatment:    Reason Eval/Treat Not Completed: Medical issues which prohibited therapy.  Pt on bedrest through the weekend.  Will see Monday as able. 07/06/2020  Jacinto Halim., PT Acute Rehabilitation Services [phone]  (pager) [phone]  (office)   [name] [name] 07/06/2020, 4:20 PM

## 2020-07-06 NOTE — Progress Notes (Signed)
OT Cancellation Note  Patient Details Name: [name] MRN: [id number] DOB: [birth year]   Cancelled Treatment:    Reason Eval/Treat Not Completed: Medical issues which prohibited therapy. Note pt. To be On bedrest throughout the weekend. Will check back first of week for therapeutic appropriateness.   Evangeline Gula [place]-COTA/L 07/06/2020, 11:15 AM

## 2020-07-07 DIAGNOSIS — Z981 Arthrodesis status: Secondary | ICD-10-CM | POA: Diagnosis not present

## 2020-07-07 DIAGNOSIS — R1031 Right lower quadrant pain: Secondary | ICD-10-CM | POA: Diagnosis not present

## 2020-07-07 NOTE — Plan of Care (Signed)
  Problem: Education: Goal: Knowledge of General Education information will improve Description: Including pain rating scale, medication(s)/side effects and non-pharmacologic comfort measures 07/07/2020 1446 by Genevie Ann, RN Outcome: Progressing 07/07/2020 1445 by Genevie Ann, RN Outcome: Progressing

## 2020-07-07 NOTE — Progress Notes (Signed)
Patient ID: [name], female   DOB: [birth year], 65 y.o.   MRN: [id number] BP 130/67 (BP Location: Left Arm)   Pulse 77   Temp 98.1 F (36.7 C)   Resp 16   Ht 5\' 2"  (1.575 m)   Wt 87.5 kg   SpO2 98%   BMI 35.30 kg/m  Alert and oriented x 4 Wound dressing is dry Feeling much better Moving all extremities Continue with bedrest

## 2020-07-07 NOTE — Progress Notes (Signed)
Patient ID: [name], female   DOB: 16-Oct-1955, 65 y.o.   MRN: [id number]  PROGRESS NOTE    [name]  KZS:[id number] DOB: Dec 04, 1955 DOA: 06/28/2020 PCP: Georgianne Fick, MD   Brief Narrative:  65 year old female patient with past medical history of rheumatoid arthritis, lumbar stenosis, hypothyroidism, fibromyalgia, HTN, presented with worsening back pain and underwent elective back surgery by neurosurgery on 06/28/2020.  TRH was consulted on 07/04/2020 because of worsening abdominal pain.  Assessment & Plan:   Abdominal pain -X-ray abdomen on 07/04/2020 did not show any evidence of obstruction.  She had severe abdominal pain on 07/05/2020. -Lipase was normal on 07/04/2020.  CT of the abdomen and pelvis with contrast on 07/05/2020 showed no acute findings in the abdomen except for nonspecific thickening of the distal ileum.  Subsequently diet was resumed. -Currently on IV Protonix twice a day. -Patient had a lot of diarrhea on 07/06/2020.  We will hold off on stool softener/laxatives for now. -Abdominal pain improving.  Tolerating diet.  Scoliosis with lumbar spinal stenosis status post T12-L2 decompression -Management as per primary neurosurgical team.  Patient was put on doxycycline by neurosurgery on 07/05/2020 for drainage from the surgical site.  Currently on bedrest.  Rheumatoid arthritis -Continue home regimen.  Outpatient follow-up with rheumatology  Hypertension -Blood pressure stable.  Continue losartan and hydrochlorothiazide  Hypothyroidism -Continue levothyroxine  Subjective: Patient seen and examined at bedside.  Had 5-6 episodes of loose bowel movements yesterday with abdominal pain.  Abdominal pain improving this morning.  No recent fever or chest pain reported. Objective: Vitals:   07/06/20 1704 07/06/20 2005 07/06/20 2345 07/07/20 0349  BP: (!) 121/59 (!) 143/80 (!) 125/57 123/63  Pulse: 88 85 81 75  Resp:  18    Temp: 98.5 F (36.9 C) 98.9 F (37.2 C)  98.6 F (37 C) 98.1 F (36.7 C)  TempSrc: Oral Oral Oral Oral  SpO2: 99% 99% 97% 97%  Weight:      Height:        Intake/Output Summary (Last 24 hours) at 07/07/2020 0750 Last data filed at 07/07/2020 0148 Gross per 24 hour  Intake 120 ml  Output 400 ml  Net -280 ml   Filed Weights   06/28/20 0611  Weight: 87.5 kg    Examination:  General exam: Looks chronically ill.  No distress. Respiratory system: Bilateral decreased breath sounds at bases, scattered crackles cardiovascular system: S1-S2 heard, rate controlled gastrointestinal system: Abdomen is slightly distended, soft, very minimally tender in the right lower quadrant.  Normal bowel sounds are heard Extremities: No cyanosis.  Trace lower extremity edema present.  Data Reviewed: I have personally reviewed following labs and imaging studies  CBC: Recent Labs  Lab 07/05/20 0944 07/06/20 0345  WBC 5.6 7.0  HGB 8.8* 9.1*  HCT 25.0* 27.4*  MCV 91.9 93.8  PLT 320 332   Basic Metabolic Panel: Recent Labs  Lab 07/05/20 1005  NA 137  K 2.9*  CL 97*  CO2 30  GLUCOSE 102*  BUN 7*  CREATININE 0.73  CALCIUM 8.9   GFR: Estimated Creatinine Clearance: 72.1 mL/min (by C-G formula based on SCr of 0.73 mg/dL). Liver Function Tests: Recent Labs  Lab 07/05/20 1005  AST 27  ALT 11  ALKPHOS 60  BILITOT 0.8  PROT 5.4*  ALBUMIN 2.7*   Recent Labs  Lab 07/04/20 1751  LIPASE 20   No results for input(s): AMMONIA in the last 168 hours. Coagulation Profile: No results for input(s): INR,  PROTIME in the last 168 hours. Cardiac Enzymes: No results for input(s): CKTOTAL, CKMB, CKMBINDEX, TROPONINI in the last 168 hours. BNP (last 3 results) No results for input(s): PROBNP in the last 8760 hours. HbA1C: No results for input(s): HGBA1C in the last 72 hours. CBG: No results for input(s): GLUCAP in the last 168 hours. Lipid Profile: No results for input(s): CHOL, HDL, LDLCALC, TRIG, CHOLHDL, LDLDIRECT in the last 72  hours. Thyroid Function Tests: Recent Labs    07/04/20 1538  TSH 2.404   Anemia Panel: No results for input(s): VITAMINB12, FOLATE, FERRITIN, TIBC, IRON, RETICCTPCT in the last 72 hours. Sepsis Labs: No results for input(s): PROCALCITON, LATICACIDVEN in the last 168 hours.  No results found for this or any previous visit (from the past 240 hour(s)).       Radiology Studies: CT ABDOMEN PELVIS W CONTRAST  Result Date: 07/05/2020 CLINICAL DATA:  Recent back surgery with some drainage. Abdominal pain. EXAM: CT ABDOMEN AND PELVIS WITH CONTRAST TECHNIQUE: Multidetector CT imaging of the abdomen and pelvis was performed using the standard protocol following bolus administration of intravenous contrast. CONTRAST:  OMNIPAQUE IOHEXOL 300 MG/ML  SOLN COMPARISON:  None. FINDINGS: Lower chest: Small LEFT pleural effusion and moderate LEFT basilar atelectasis. Hepatobiliary: No focal hepatic lesion. No biliary duct dilatation. Common bile duct is normal. Pancreas: Pancreas is normal. No ductal dilatation. No pancreatic inflammation. Spleen: Normal spleen Adrenals/urinary tract: Adrenal glands and kidneys are normal. The ureters and bladder normal. Stomach/Bowel: Stomach, duodenum small-bowel normal. Mild bowel wall thickening in the distal ileum leading up the terminal ileum (image 57/3). Ascending, transverse and descending colon normal. Rectum normal Vascular/Lymphatic: Abdominal aorta is normal caliber with atherosclerotic calcification. There is no retroperitoneal or periportal lymphadenopathy. No pelvic lymphadenopathy. Reproductive: Post hysterectomy Other: Gas in the subcutaneous tissue along the LEFT lateral abdominal wall (image series 3). Musculoskeletal: Multilevel posterior thoracolumbar fusion. Multilevel disc posterior decompression of the lumbar spine. fluid within the decompression site without evidence of enhancement. Small linear fluid collection within the subcutaneous tissue  posterior posterior RIGHT midline parallels the decompression (image 28/3. IMPRESSION: 1. Dense LEFT basilar atelectasis and small effusion. 2. No acute findings in the abdomen. 3. Thickening of the distal ileum is nonspecific. Recommend correlation with inflammatory bowel disease. 4. Posterior thoracolumbar fusion and decompression. Fluid collection at the decompression site and subcutaneous tissue without specific complicating features. 5. Gas in the subcutaneous tissue of the LEFT flank presumably related to recent surgery Electronically Signed   By: Genevive Bi M.D.   On: 07/05/2020 13:59        Scheduled Meds: . acidophilus  2 capsule Oral Daily  . buPROPion  450 mg Oral Daily  . Chlorhexidine Gluconate Cloth  6 each Topical Daily  . docusate sodium  100 mg Oral BID  . doxycycline  100 mg Oral Q12H  . enoxaparin (LOVENOX) injection  40 mg Subcutaneous Q24H  . losartan  100 mg Oral Daily   And  . hydrochlorothiazide  12.5 mg Oral Daily  . leflunomide  20 mg Oral QHS  . levothyroxine  100 mcg Oral Q Sat  . levothyroxine  200 mcg Oral Once per day on Mon Tue Wed Thu Fri  . pantoprazole (PROTONIX) IV  40 mg Intravenous Q12H  . pramipexole  0.125 mg Oral TID  . senna  1 tablet Oral BID  . silver sulfADIAZINE   Topical BID  . sodium chloride flush  3 mL Intravenous Q12H   Continuous Infusions: .  sodium chloride    . lactated ringers 50 mL/hr at 06/30/20 1457  . methocarbamol (ROBAXIN) IV            Glade Lloyd, MD [facility] 07/07/2020, 7:50 AM

## 2020-07-07 NOTE — Plan of Care (Signed)
  Problem: Education: Goal: Knowledge of General Education information will improve Description Including pain rating scale, medication(s)/side effects and non-pharmacologic comfort measures Outcome: Progressing   

## 2020-07-08 DIAGNOSIS — Z981 Arthrodesis status: Secondary | ICD-10-CM | POA: Diagnosis not present

## 2020-07-08 DIAGNOSIS — R1031 Right lower quadrant pain: Secondary | ICD-10-CM | POA: Diagnosis not present

## 2020-07-08 LAB — COMPREHENSIVE METABOLIC PANEL
ALT: 15 U/L (ref 0–44)
AST: 46 U/L — ABNORMAL HIGH (ref 15–41)
Albumin: 2.9 g/dL — ABNORMAL LOW (ref 3.5–5.0)
Alkaline Phosphatase: 74 U/L (ref 38–126)
Anion gap: 11 (ref 5–15)
BUN: 7 mg/dL — ABNORMAL LOW (ref 8–23)
CO2: 24 mmol/L (ref 22–32)
Calcium: 9.2 mg/dL (ref 8.9–10.3)
Chloride: 100 mmol/L (ref 98–111)
Creatinine, Ser: 0.75 mg/dL (ref 0.44–1.00)
GFR, Estimated: >60 mL/min (ref >60)
Glucose, Bld: 102 mg/dL — ABNORMAL HIGH (ref 70–99)
Potassium: 3.9 mmol/L (ref 3.5–5.1)
Sodium: 135 mmol/L (ref 135–145)
Total Bilirubin: 0.8 mg/dL (ref 0.3–1.2)
Total Protein: 5.7 g/dL — ABNORMAL LOW (ref 6.5–8.1)

## 2020-07-08 LAB — CBC WITH DIFFERENTIAL/PLATELET
Abs Immature Granulocytes: 0.09 10*3/uL — ABNORMAL HIGH (ref 0.00–0.07)
Basophils Absolute: 0.1 10*3/uL (ref 0.0–0.1)
Basophils Relative: 1 %
Eosinophils Absolute: 0.4 10*3/uL (ref 0.0–0.5)
Eosinophils Relative: 5 %
HCT: 30.6 % — ABNORMAL LOW (ref 36.0–46.0)
Hemoglobin: 10.5 g/dL — ABNORMAL LOW (ref 12.0–15.0)
Immature Granulocytes: 1 %
Lymphocytes Relative: 30 %
Lymphs Abs: 2.4 10*3/uL (ref 0.7–4.0)
MCH: 32 pg (ref 26.0–34.0)
MCHC: 34.3 g/dL (ref 30.0–36.0)
MCV: 93.3 fL (ref 80.0–100.0)
Monocytes Absolute: 0.8 10*3/uL (ref 0.1–1.0)
Monocytes Relative: 10 %
Neutro Abs: 4.3 10*3/uL (ref 1.7–7.7)
Neutrophils Relative %: 53 %
Platelets: 422 10*3/uL — ABNORMAL HIGH (ref 150–400)
RBC: 3.28 MIL/uL — ABNORMAL LOW (ref 3.87–5.11)
RDW: 13.8 % (ref 11.5–15.5)
WBC: 7.9 10*3/uL (ref 4.0–10.5)
nRBC: 0 % (ref 0.0–0.2)

## 2020-07-08 LAB — MAGNESIUM: Magnesium: 1.8 mg/dL (ref 1.7–2.4)

## 2020-07-08 NOTE — Progress Notes (Signed)
Pt transferred to [facility]. All belongings in room packed. Pt transferred over in bed by RN and NT with husband at bedside.   Robina Ade, RN

## 2020-07-08 NOTE — Progress Notes (Signed)
Subjective: Patient reports she feels better. No headaches today, though she had some yesterday evening. C/o poorly localizing right hip pain that wasn't there prior to surgery.  Objective: Vital signs in last 24 hours: Temp:  [97.3 F (36.3 C)-98.7 F (37.1 C)] 98.7 F (37.1 C) (01/16 0710) Pulse Rate:  [68-86] 68 (01/16 0710) Resp:  [16-17] 16 (01/16 0710) BP: (111-124)/(50-64) 112/50 (01/16 0710) SpO2:  [96 %-98 %] 96 % (01/16 0710)  Intake/Output from previous day: 01/15 0701 - 01/16 0700 In: 240 [P.O.:240] Out: 1750 [Urine:1750] Intake/Output this shift: No intake/output data recorded.  5/5 strength in LEs Large dressing dry.  Smaller dressing underneath with small staining but no ongoing leaking noted  Lab Results: Recent Labs    07/06/20 0345 07/08/20 0107  WBC 7.0 7.9  HGB 9.1* 10.5*  HCT 27.4* 30.6*  PLT 332 422*   BMET Recent Labs    07/08/20 0107  NA 135  K 3.9  CL 100  CO2 24  GLUCOSE 102*  BUN 7*  CREATININE 0.75  CALCIUM 9.2    Studies/Results: No results found.  Assessment/Plan: S/p multilevel lumbar fusion and decompression - will downgrade to 3C - likely will mobilize tomorrow   Bedelia Person 07/08/2020, 11:20 AM

## 2020-07-08 NOTE — Progress Notes (Signed)
Patient ID: [name], female   DOB: Jun 08, 1956, 65 y.o.   MRN: [id number]  PROGRESS NOTE    ERUM CERCONE  [id number]:[id number] DOB: [birth year] DOA: 06/28/2020 PCP: Georgianne Fick, MD   Brief Narrative:  65 year old female patient with past medical history of rheumatoid arthritis, lumbar stenosis, hypothyroidism, fibromyalgia, HTN, presented with worsening back pain and underwent elective back surgery by neurosurgery on 06/28/2020.  TRH was consulted on 07/04/2020 because of worsening abdominal pain.  Assessment & Plan:   Abdominal pain -X-ray abdomen on 07/04/2020 did not show any evidence of obstruction.  She had severe abdominal pain on 07/05/2020. -Lipase was normal on 07/04/2020.  CT of the abdomen and pelvis with contrast on 07/05/2020 showed no acute findings in the abdomen except for nonspecific thickening of the distal ileum.   -Currently on IV Protonix twice a day.  We will switch to oral tomorrow. -Patient had a lot of diarrhea on 07/06/2020.  hold off on stool softener/laxatives for now.  Diarrhea improving. -Abdominal pain improving.  Tolerating diet.  Scoliosis with lumbar spinal stenosis status post T12-L2 decompression -Management as per primary neurosurgical team.  Patient was put on doxycycline by neurosurgery on 07/05/2020 for drainage from the surgical site.  Currently on bedrest.  Rheumatoid arthritis -Continue home regimen.  Outpatient follow-up with rheumatology  Hypertension -Blood pressure stable.  Continue losartan and hydrochlorothiazide  Hypothyroidism -Continue levothyroxine  Subjective: Patient seen and examined at bedside.  Denies fever, worsening shortness with, chest pain.  States that her abdominal pain is improving along with diarrhea  objective: Vitals:   07/07/20 1949 07/07/20 2356 07/08/20 0330 07/08/20 0710  BP: 111/63 124/64 121/63 (!) 112/50  Pulse: 80 86 79 68  Resp: 16 17 16 16   Temp: 97.9 F (36.6 C) 98.7 F (37.1 C) (!) 97.3 F (36.3 C)  98.7 F (37.1 C)  TempSrc: Oral Oral Oral Oral  SpO2: 98% 96% 96% 96%  Weight:      Height:        Intake/Output Summary (Last 24 hours) at 07/08/2020 0736 Last data filed at 07/08/2020 0349 Gross per 24 hour  Intake 240 ml  Output 1750 ml  Net -1510 ml   Filed Weights   06/28/20 0611  Weight: 87.5 kg    Examination:  General exam: No acute distress.  Chronically ill looking Respiratory system: Decreased breath sounds at bases bilaterally, no wheezing cardiovascular system: Rate controlled, S1-S2 heard  gastrointestinal system: Abdomen is distended slightly, soft, mildly tender in the right lower quadrant.  Bowel sounds are heard Extremities: Mild lower extremity edema present.  No clubbing.  Data Reviewed: I have personally reviewed following labs and imaging studies  CBC: Recent Labs  Lab 07/05/20 0944 07/06/20 0345 07/08/20 0107  WBC 5.6 7.0 7.9  NEUTROABS  --   --  4.3  HGB 8.8* 9.1* 10.5*  HCT 25.0* 27.4* 30.6*  MCV 91.9 93.8 93.3  PLT 320 332 422*   Basic Metabolic Panel: Recent Labs  Lab 07/05/20 1005 07/08/20 0107  NA 137 135  K 2.9* 3.9  CL 97* 100  CO2 30 24  GLUCOSE 102* 102*  BUN 7* 7*  CREATININE 0.73 0.75  CALCIUM 8.9 9.2  MG  --  1.8   GFR: Estimated Creatinine Clearance: 72.1 mL/min (by C-G formula based on SCr of 0.75 mg/dL). Liver Function Tests: Recent Labs  Lab 07/05/20 1005 07/08/20 0107  AST 27 46*  ALT 11 15  ALKPHOS 60 74  BILITOT 0.8 0.8  PROT 5.4* 5.7*  ALBUMIN 2.7* 2.9*   Recent Labs  Lab 07/04/20 1751  LIPASE 20   No results for input(s): AMMONIA in the last 168 hours. Coagulation Profile: No results for input(s): INR, PROTIME in the last 168 hours. Cardiac Enzymes: No results for input(s): CKTOTAL, CKMB, CKMBINDEX, TROPONINI in the last 168 hours. BNP (last 3 results) No results for input(s): PROBNP in the last 8760 hours. HbA1C: No results for input(s): HGBA1C in the last 72 hours. CBG: No results for  input(s): GLUCAP in the last 168 hours. Lipid Profile: No results for input(s): CHOL, HDL, LDLCALC, TRIG, CHOLHDL, LDLDIRECT in the last 72 hours. Thyroid Function Tests: No results for input(s): TSH, T4TOTAL, FREET4, T3FREE, THYROIDAB in the last 72 hours. Anemia Panel: No results for input(s): VITAMINB12, FOLATE, FERRITIN, TIBC, IRON, RETICCTPCT in the last 72 hours. Sepsis Labs: No results for input(s): PROCALCITON, LATICACIDVEN in the last 168 hours.  No results found for this or any previous visit (from the past 240 hour(s)).       Radiology Studies: No results found.      Scheduled Meds: . acidophilus  2 capsule Oral Daily  . buPROPion  450 mg Oral Daily  . Chlorhexidine Gluconate Cloth  6 each Topical Daily  . doxycycline  100 mg Oral Q12H  . enoxaparin (LOVENOX) injection  40 mg Subcutaneous Q24H  . losartan  100 mg Oral Daily   And  . hydrochlorothiazide  12.5 mg Oral Daily  . leflunomide  20 mg Oral QHS  . levothyroxine  100 mcg Oral Q Sat  . levothyroxine  200 mcg Oral Once per day on Mon Tue Wed Thu Fri  . pantoprazole (PROTONIX) IV  40 mg Intravenous Q12H  . pramipexole  0.125 mg Oral TID  . silver sulfADIAZINE   Topical BID  . sodium chloride flush  3 mL Intravenous Q12H   Continuous Infusions: . sodium chloride    . lactated ringers 50 mL/hr at 06/30/20 1457  . methocarbamol (ROBAXIN) IV            Glade Lloyd, MD [facility] 07/08/2020, 7:36 AM

## 2020-07-09 DIAGNOSIS — R1031 Right lower quadrant pain: Secondary | ICD-10-CM | POA: Diagnosis not present

## 2020-07-09 DIAGNOSIS — Z981 Arthrodesis status: Secondary | ICD-10-CM | POA: Diagnosis not present

## 2020-07-09 MED ORDER — PANTOPRAZOLE SODIUM 40 MG PO TBEC
40.0000 mg | DELAYED_RELEASE_TABLET | Freq: Two times a day (BID) | ORAL | Status: DC
  Administered 2020-07-09 – 2020-07-13 (×9): 40 mg via ORAL
  Filled 2020-07-09 (×9): qty 1

## 2020-07-09 MED ORDER — TRAMADOL HCL 50 MG PO TABS: 50.0000 mg | ORAL_TABLET | Freq: Four times a day (QID) | ORAL | Status: DC | PRN

## 2020-07-09 MED ORDER — HYDROCODONE-ACETAMINOPHEN 5-325 MG PO TABS
1.0000 | ORAL_TABLET | ORAL | Status: DC | PRN
  Administered 2020-07-09 – 2020-07-12 (×16): 2 via ORAL
  Filled 2020-07-09 (×16): qty 2

## 2020-07-09 NOTE — Progress Notes (Signed)
Physical Therapy Treatment Patient Details Name: [name] MRN: [id number] DOB: Nov 11, 1955 Today's Date: 07/09/2020    History of Present Illness The pt is a 65 yo female presenting s/p anterolateal decompression and fusion of T12-L1 and L1-L2 on 1/06. Pt developed post operative L ptx and is now s/p chest tube insertion. PMH includes: anxiety, arthritis, HTN, vertigo, fibromyalgia, previous lumbar fusion (L3-sacrum), and bilateral TKA. (Simultaneous filing. User may not have seen previous data.)    PT Comments    Per Dr. Danielle Dess pt able to dangle today and progress to chair tomorrow after being on bedrest for the last 4 days. Pt with significant lightheadedness at EOB but moved well. Educated pt on proper sidelying with pillows between knees and hugging a pillow instead of reaching behind self. Instructed RN staff to dangle patient atleast 2 more times today and asked husband to raise her bed up throughout the day to help improved upright tolerance. Suspect once pt over lightheadedness with upright position pt will progress well and be able to d/c home with spouse. Acute PT to cont to follow and re-assess d/c recs.   Follow Up Recommendations  Home health PT;Supervision/Assistance - 24 hour     Equipment Recommendations  Rolling walker with 5" wheels;3in1 (PT)    Recommendations for Other Services       Precautions / Restrictions Precautions Precautions: Fall;Back (Simultaneous filing. User may not have seen previous data.) Precaution Booklet Issued: Yes (comment) Precaution Comments: orthostatic hypotension (Simultaneous filing. User may not have seen previous data.) Required Braces or Orthoses: Spinal Brace (Simultaneous filing. User may not have seen previous data.) Spinal Brace: Lumbar corset;Applied in sitting position (Simultaneous filing. User may not have seen previous data.) Restrictions Weight Bearing Restrictions: No (Simultaneous filing. User may not have seen previous  data.)    Mobility  Bed Mobility Overal bed mobility: Needs Assistance (Simultaneous filing. User may not have seen previous data.) Bed Mobility: Rolling;Sidelying to Sit;Sit to Sidelying (Simultaneous filing. User may not have seen previous data.) Rolling: Min guard (Simultaneous filing. User may not have seen previous data.) Sidelying to sit: Min assist (Simultaneous filing. User may not have seen previous data.) Supine to sit: Min assist;+2 for physical assistance   Sit to sidelying: Min assist General bed mobility comments: max directional cues to adhere to back precautions, minA for trunk eelavtion and increased time due to first time up in 4 days as pt was on bed rest (Simultaneous filing. User may not have seen previous data.)  Transfers                 General transfer comment: orders to dangle only today (Simultaneous filing. User may not have seen previous data.)  Ambulation/Gait             General Gait Details: limited to dangle only today   Stairs             Wheelchair Mobility    Modified Rankin (Stroke Patients Only)       Balance Overall balance assessment: Needs assistance (Simultaneous filing. User may not have seen previous data.) Sitting-balance support: Feet supported (Simultaneous filing. User may not have seen previous data.) Sitting balance-Leahy Scale: Fair (Simultaneous filing. User may not have seen previous data.) Sitting balance - Comments: s/p a couple of minutes pt with significant light headedness, BP 107/76, pt reported vision to go dim, pt returned to sidelying (Simultaneous filing. User may not have seen previous data.)  Cognition Arousal/Alertness: Awake/alert (Simultaneous filing. User may not have seen previous data.) Behavior During Therapy: [facility] for tasks assessed/performed (Simultaneous filing. User may not have seen previous data.) Overall Cognitive Status: Within  Functional Limits for tasks assessed (Simultaneous filing. User may not have seen previous data.)                                 General Comments: Pt following all commands, but fatigues easily and unable to tolerate more than EOB for <2 minutes today.      Exercises      General Comments General comments (skin integrity, edema, etc.): educated on precautions, incision dressing dry, no drainage from incision when sitting up, BP 107/76 sitting EOB s/p 2 min (Simultaneous filing. User may not have seen previous data.)      Pertinent Vitals/Pain Pain Assessment: Faces (Simultaneous filing. User may not have seen previous data.) Faces Pain Scale: Hurts a little bit (Simultaneous filing. User may not have seen previous data.) Pain Location: back with mobility (Simultaneous filing. User may not have seen previous data.) Pain Descriptors / Indicators: Discomfort;Sore (Simultaneous filing. User may not have seen previous data.) Pain Intervention(s): Monitored during session (Simultaneous filing. User may not have seen previous data.)    Home Living                      Prior Function            PT Goals (current goals can now be found in the care plan section) Progress towards PT goals: Progressing toward goals    Frequency    Min 5X/week      PT Plan Current plan remains appropriate    Co-evaluation PT/OT/SLP Co-Evaluation/Treatment: Yes Reason for Co-Treatment: For patient/therapist safety (pt just released from bed rest/ allowed to dangle today, transfer to chair tomorrow per Dr. Danielle Dess  Simultaneous filing. User may not have seen previous data.) PT goals addressed during session: Mobility/safety with mobility OT goals addressed during session: ADL's and self-care;Strengthening/ROM      AM-PAC PT "6 Clicks" Mobility   Outcome Measure  Help needed turning from your back to your side while in a flat bed without using bedrails?: A Little Help needed  moving from lying on your back to sitting on the side of a flat bed without using bedrails?: A Little Help needed moving to and from a bed to a chair (including a wheelchair)?: A Little Help needed standing up from a chair using your arms (e.g., wheelchair or bedside chair)?: A Little Help needed to walk in hospital room?: A Little   6 Click Score: 15    End of Session   Activity Tolerance: Other (comment) (limited by lightheadedness EOB and orders to only dangel) Patient left: in bed;with call bell/phone within reach;with family/visitor present Nurse Communication: Mobility status PT Visit Diagnosis: Other abnormalities of gait and mobility (R26.89);Pain     Time: 1030-1053 PT Time Calculation (min) (ACUTE ONLY): 23 min  Charges:  $Therapeutic Activity: 8-22 mins                     Lewis Shock, PT, DPT Acute Rehabilitation Services Pager #: [phone] Office #: [phone]    Iona Hansen 07/09/2020, 1:46 PM

## 2020-07-09 NOTE — Progress Notes (Signed)
Patient ID: [name], female   DOB: 01-28-1956, 65 y.o.   MRN: [id number] Vital signs are stable Dressing was changed by me today and the incision appears dry Some of the glue was removed Pain at the incision with Betadine Dry gauze applied Will start to advance the patient sitting up in bed and get out of bed tomorrow If things stay dry we can hopefully plan for discharge soon Will add Vicodin for pain control as she is still requiring IV morphine.

## 2020-07-09 NOTE — Progress Notes (Signed)
Occupational Therapy Treatment Patient Details Name: [name] MRN: [id number] DOB: [birth year] Today's Date: 07/09/2020    History of present illness The pt is a 64 yo female presenting s/p anterolateal decompression and fusion of T12-L1 and L1-L2 on 1/06. Pt developed post operative L ptx and is now s/p chest tube insertion. PMH includes: anxiety, arthritis, HTN, vertigo, fibromyalgia, previous lumbar fusion (L3-sacrum), and bilateral TKA. (Simultaneous filing. User may not have seen previous data.)   OT comments  Pt seen with PT incase of faint spell with BP varying with transitional movement. Pt severely limited by decreased activity tolerance, orthostatic hypotension and limtied movement at this time. Pt requiring bed pan for LB toileting due to low BP in sitting and nausea. BP: 123/76 in supine; sitting at EOB 107/76; no dizziness, but faintlike feeling sitting upright for 2 mins so pt returned to supine. Pt minA overall for bed mobility +2 to adhere to back precautions. Pt positioned in bed for comfort and returned to flat due to pt not tolerate EOB well. OT following acutely to encouraged pacing of activities and to introduce LB ADL AE needs. * per Dr. Danielle Dess, pt able to perform sitting in recliner tomorrow based on progress made at EOB today.    Follow Up Recommendations  Home health OT;Supervision/Assistance - 24 hour (pending progress)    Equipment Recommendations  3 in 1 bedside commode    Recommendations for Other Services      Precautions / Restrictions Precautions Precautions: Fall;Back  Precaution Booklet Issued: Yes Precaution Comments: orthostatic hypotension  Required Braces or Orthoses: Spinal Brace  Spinal Brace: Lumbar corset;Applied in sitting position  Restrictions Weight Bearing Restrictions: No      Mobility Bed Mobility Overal bed mobility: Needs Assistance (Simultaneous filing. User may not have seen previous data.) Bed Mobility: Rolling;Sidelying  to Sit;Sit to Sidelying (Simultaneous filing. User may not have seen previous data.) Rolling: Min guard (Simultaneous filing. User may not have seen previous data.) Sidelying to sit: Min assist (Simultaneous filing. User may not have seen previous data.) Supine to sit: Min assist;+2 for physical assistance   Sit to sidelying: Min assist General bed mobility comments: max directional cues to adhere to back precautions, minA for trunk eelavtion and increased time due to first time up in 4 days as pt was on bed rest (Simultaneous filing. User may not have seen previous data.)  Transfers                 General transfer comment: orders to dangle only today (Simultaneous filing. User may not have seen previous data.)    Balance Overall balance assessment: Needs assistance (Simultaneous filing. User may not have seen previous data.) Sitting-balance support: Feet supported (Simultaneous filing. User may not have seen previous data.) Sitting balance-Leahy Scale: Fair (Simultaneous filing. User may not have seen previous data.) Sitting balance - Comments: s/p a couple of minutes pt with significant light headedness, BP 107/76, pt reported vision to go dim, pt returned to sidelying (Simultaneous filing. User may not have seen previous data.)                                   ADL either performed or assessed with clinical judgement   ADL Overall ADL's : Needs assistance/impaired     Grooming: Maximal assistance;Sitting Grooming Details (indicate cue type and reason): Pt  unable to hold wash cloth to head for >10 secs as  she was not feeling well.                             Functional mobility during ADLs: Min guard;+2 for safety/equipment (EOB only) General ADL Comments: Pt severely limited by decreased activity tolerance, orthostatic hypotension and limtied movement at this time. Pt requiring bed pan for LB toileting due to low BP in sitting and nausea.      Vision       Perception     Praxis      Cognition Arousal/Alertness: Awake/alert (Simultaneous filing. User may not have seen previous data.) Behavior During Therapy: [facility] for tasks assessed/performed (Simultaneous filing. User may not have seen previous data.) Overall Cognitive Status: Within Functional Limits for tasks assessed (Simultaneous filing. User may not have seen previous data.)                                 General Comments: Pt following all commands, but fatigues easily and unable to tolerate more than EOB for <2 minutes today.        Exercises     Shoulder Instructions       General Comments BP: 123/76 in supine; sitting at EOB 107/76; no dizziness, but faintlike feeling sitting upright for 2 mins so pt returned to supine. Pt and RN advised to raise HOB to 30* in 20 mins after lying on side to increase sitting upright tolerance (Simultaneous filing. User may not have seen previous data.)    Pertinent Vitals/ Pain       Pain Assessment: Faces (Simultaneous filing. User may not have seen previous data.) Faces Pain Scale: Hurts a little bit (Simultaneous filing. User may not have seen previous data.) Pain Location: back with mobility (Simultaneous filing. User may not have seen previous data.) Pain Descriptors / Indicators: Discomfort;Sore (Simultaneous filing. User may not have seen previous data.) Pain Intervention(s): Monitored during session (Simultaneous filing. User may not have seen previous data.)  Home Living                                          Prior Functioning/Environment              Frequency  Min 2X/week        Progress Toward Goals  OT Goals(current goals can now be found in the care plan section)  Progress towards OT goals: Progressing toward goals  Acute Rehab OT Goals Patient Stated Goal: feel better OT Goal Formulation: With patient/family Time For Goal Achievement: 07/13/20 Potential to  Achieve Goals: Good ADL Goals Pt Will Transfer to Toilet: bedside commode;ambulating;with supervision Additional ADL Goal #1: pt will complete bed mobility mod I as precursor to adls. Additional ADL Goal #2: pt will don doff brace mod I as precursor to adls  Plan Discharge plan remains appropriate    Co-evaluation    PT/OT/SLP Co-Evaluation/Treatment: Yes Reason for Co-Treatment: For patient/therapist safety (pt just released from bed rest/ allowed to dangle today, transfer to chair tomorrow per Dr. Danielle Dess  Simultaneous filing. User may not have seen previous data.) PT goals addressed during session: Mobility/safety with mobility OT goals addressed during session: ADL's and self-care;Strengthening/ROM      AM-PAC OT "6 Clicks" Daily Activity     Outcome Measure   Help from  another person eating meals?: A Little Help from another person taking care of personal grooming?: A Lot Help from another person toileting, which includes using toliet, bedpan, or urinal?: A Lot Help from another person bathing (including washing, rinsing, drying)?: A Lot Help from another person to put on and taking off regular upper body clothing?: A Lot Help from another person to put on and taking off regular lower body clothing?: A Lot 6 Click Score: 13    End of Session    OT Visit Diagnosis: Unsteadiness on feet (R26.81);Pain Pain - part of body:  (low back)   Activity Tolerance Treatment limited secondary to medical complications (Comment)   Patient Left in bed;with call bell/phone within reach;with family/visitor present   Nurse Communication Mobility status;Other (comment) (need to let pt sit EOB x2 more times today)        Time: 2174-7159 OT Time Calculation (min): 23 min  Charges: OT General Charges $OT Visit: 1 Visit OT Treatments $Therapeutic Activity: 8-22 mins  Flora Lipps, OTR/L Acute Rehabilitation Services Pager: [phone] Office: [phone]    Chalmers Iddings C 07/09/2020,  1:50 PM

## 2020-07-09 NOTE — Progress Notes (Signed)
Patient ID: [name], female   DOB: 1955/11/25, 65 y.o.   MRN: [id number]  PROGRESS NOTE    [name]  GYI:[id number] DOB: 09/03/1955 DOA: 06/28/2020 PCP: Georgianne Fick, MD   Brief Narrative:  65 year old female patient with past medical history of rheumatoid arthritis, lumbar stenosis, hypothyroidism, fibromyalgia, HTN, presented with worsening back pain and underwent elective back surgery by neurosurgery on 06/28/2020.  TRH was consulted on 07/04/2020 because of worsening abdominal pain.  Assessment & Plan:   Abdominal pain -X-ray abdomen on 07/04/2020 did not show any evidence of obstruction.  She had severe abdominal pain on 07/05/2020. -Lipase was normal on 07/04/2020.  CT of the abdomen and pelvis with contrast on 07/05/2020 showed no acute findings in the abdomen except for nonspecific thickening of the distal ileum.   -Currently on IV Protonix twice a day.  Switch to oral Protonix twice a day for now. -Patient had a lot of diarrhea on 07/06/2020.  hold off on stool softener/laxatives for now.  Diarrhea improving. -Abdominal pain improving.  Tolerating diet.  Scoliosis with lumbar spinal stenosis status post T12-L2 decompression -Management as per primary neurosurgical team.  Patient was put on doxycycline by neurosurgery on 07/05/2020 for drainage from the surgical site.   Rheumatoid arthritis -Continue home regimen.  Outpatient follow-up with rheumatology  Hypertension -Blood pressure stable.  Continue losartan and hydrochlorothiazide  Hypothyroidism -Continue levothyroxine  Subjective: Patient seen and examined at bedside.  No overnight worsening shortness breath, chest pain, vomiting.  Abdominal pain has much improved. Diarrhea has improved.  objective: Vitals:   07/08/20 1934 07/08/20 2329 07/09/20 0412 07/09/20 0827  BP: 123/60 103/64 119/60 123/70  Pulse: 83 91 81 86  Resp: 20 18 18 16   Temp: 98.8 F (37.1 C) 98.3 F (36.8 C) 98.1 F (36.7 C) 98.2 F (36.8  C)  TempSrc: Oral Oral Oral Oral  SpO2: 99% 100% 97% 100%  Weight:      Height:        Intake/Output Summary (Last 24 hours) at 07/09/2020 1030 Last data filed at 07/08/2020 1605 Gross per 24 hour  Intake 240 ml  Output 730 ml  Net -490 ml   Filed Weights   06/28/20 0611  Weight: 87.5 kg    Examination:  General exam: Chronically ill.  No distress.   Respiratory system: Bilateral decreased breath sounds at bases with some scattered crackles  cardiovascular system: S1-S2 heard, rate controlled gastrointestinal system: Abdomen is distended slightly, soft, currently nontender.  Extremities: No cyanosis.  Trace lower extremity edema present  Data Reviewed: I have personally reviewed following labs and imaging studies  CBC: Recent Labs  Lab 07/05/20 0944 07/06/20 0345 07/08/20 0107  WBC 5.6 7.0 7.9  NEUTROABS  --   --  4.3  HGB 8.8* 9.1* 10.5*  HCT 25.0* 27.4* 30.6*  MCV 91.9 93.8 93.3  PLT 320 332 422*   Basic Metabolic Panel: Recent Labs  Lab 07/05/20 1005 07/08/20 0107  NA 137 135  K 2.9* 3.9  CL 97* 100  CO2 30 24  GLUCOSE 102* 102*  BUN 7* 7*  CREATININE 0.73 0.75  CALCIUM 8.9 9.2  MG  --  1.8   GFR: Estimated Creatinine Clearance: 72.1 mL/min (by C-G formula based on SCr of 0.75 mg/dL). Liver Function Tests: Recent Labs  Lab 07/05/20 1005 07/08/20 0107  AST 27 46*  ALT 11 15  ALKPHOS 60 74  BILITOT 0.8 0.8  PROT 5.4* 5.7*  ALBUMIN 2.7* 2.9*  Recent Labs  Lab 07/04/20 1751  LIPASE 20   No results for input(s): AMMONIA in the last 168 hours. Coagulation Profile: No results for input(s): INR, PROTIME in the last 168 hours. Cardiac Enzymes: No results for input(s): CKTOTAL, CKMB, CKMBINDEX, TROPONINI in the last 168 hours. BNP (last 3 results) No results for input(s): PROBNP in the last 8760 hours. HbA1C: No results for input(s): HGBA1C in the last 72 hours. CBG: No results for input(s): GLUCAP in the last 168 hours. Lipid  Profile: No results for input(s): CHOL, HDL, LDLCALC, TRIG, CHOLHDL, LDLDIRECT in the last 72 hours. Thyroid Function Tests: No results for input(s): TSH, T4TOTAL, FREET4, T3FREE, THYROIDAB in the last 72 hours. Anemia Panel: No results for input(s): VITAMINB12, FOLATE, FERRITIN, TIBC, IRON, RETICCTPCT in the last 72 hours. Sepsis Labs: No results for input(s): PROCALCITON, LATICACIDVEN in the last 168 hours.  No results found for this or any previous visit (from the past 240 hour(s)).       Radiology Studies: No results found.      Scheduled Meds: . acidophilus  2 capsule Oral Daily  . buPROPion  450 mg Oral Daily  . doxycycline  100 mg Oral Q12H  . enoxaparin (LOVENOX) injection  40 mg Subcutaneous Q24H  . losartan  100 mg Oral Daily   And  . hydrochlorothiazide  12.5 mg Oral Daily  . leflunomide  20 mg Oral QHS  . levothyroxine  100 mcg Oral Q Sat  . levothyroxine  200 mcg Oral Once per day on Mon Tue Wed Thu Fri  . pantoprazole  40 mg Oral BID  . pramipexole  0.125 mg Oral TID  . silver sulfADIAZINE   Topical BID  . sodium chloride flush  3 mL Intravenous Q12H   Continuous Infusions: . sodium chloride    . lactated ringers 50 mL/hr at 06/30/20 1457  . methocarbamol (ROBAXIN) IV            Glade Lloyd, MD [facility] 07/09/2020, 10:30 AM

## 2020-07-09 NOTE — Progress Notes (Signed)
Inpatient Rehabilitation Admissions Coordinator   Off bedrest with EOB to dangle today.Noted updated PT and OT assessments continue to recommend HH. I will follow from a distance.  Ottie Glazier, RN, MSN Rehab Admissions Coordinator [phone] 07/09/2020 3:47 PM

## 2020-07-10 MED ORDER — SENNOSIDES-DOCUSATE SODIUM 8.6-50 MG PO TABS
1.0000 | ORAL_TABLET | Freq: Two times a day (BID) | ORAL | Status: DC
  Administered 2020-07-10 – 2020-07-13 (×5): 1 via ORAL
  Filled 2020-07-10 (×5): qty 1

## 2020-07-10 NOTE — Progress Notes (Signed)
Inpatient Rehabilitation Admissions Coordinator  Therapy continue to recommend home with [facility] when medically ready. We will sign off at this time.  Ottie Glazier, RN, MSN Rehab Admissions Coordinator [phone] 07/10/2020 11:57 AM

## 2020-07-10 NOTE — Progress Notes (Signed)
Patient ID: [name], female   DOB: 06-05-1956, 65 y.o.   MRN: [id number] Vital signs are stable Patient is guarding to ambulate him is still having considerable nausea when she does so Headache is not present and the wound remains dry I changed her dressing today and note that there is less redness around the incision and appears to be healing nicely We will continue to monitor and progress with therapy as patient tolerates

## 2020-07-10 NOTE — Progress Notes (Signed)
Patient ID: [name], female   DOB: 11-Jun-1956, 65 y.o.   MRN: [id number]  PROGRESS NOTE    [name]  QQP:[id number] DOB: Mar 16, 1956 DOA: 06/28/2020 PCP: Georgianne Fick, MD   Brief Narrative:  65 year old female patient with past medical history of rheumatoid arthritis, lumbar stenosis, hypothyroidism, fibromyalgia, HTN, presented with worsening back pain and underwent elective back surgery by neurosurgery on 06/28/2020.  TRH was consulted on 07/04/2020 because of worsening abdominal pain.  Assessment & Plan:   Abdominal pain -X-ray abdomen on 07/04/2020 did not show any evidence of obstruction.  She had severe abdominal pain on 07/05/2020. -Lipase was normal on 07/04/2020.  CT of the abdomen and pelvis with contrast on 07/05/2020 showed no acute findings in the abdomen except for nonspecific thickening of the distal ileum.   -Continue oral Protonix twice a day for now.  Possibly switch to once a day on discharge. -Patient had a lot of diarrhea on 07/06/2020.  held stool softener/laxatives.  Diarrhea has resolved.  Patient is now constipated.  Will restart Senokot and as needed Miralax. -Abdominal pain improved.  Tolerating diet. -Consider outpatient evaluation and follow-up by GI  Scoliosis with lumbar spinal stenosis status post T12-L2 decompression -Management as per primary neurosurgical team.  Patient was put on doxycycline by neurosurgery on 07/05/2020 for drainage from the surgical site.   Rheumatoid arthritis -Continue home regimen.  Outpatient follow-up with rheumatology  Hypertension -Blood pressure stable.  Continue losartan and hydrochlorothiazide  Hypothyroidism -Continue levothyroxine  Subjective: Patient seen and examined at bedside.  Denies worsening abdominal pain.  No overnight fever or vomiting reported.  No worsening shortness of breath  objective: Vitals:   07/09/20 1618 07/09/20 2014 07/09/20 2251 07/10/20 0324  BP: (!) 119/58 (!) 118/59 (!) 115/58 (!)  117/57  Pulse: 82 78 79 78  Resp: 16 18 18 18   Temp: 97.7 F (36.5 C) 98.1 F (36.7 C) 97.8 F (36.6 C) 98.1 F (36.7 C)  TempSrc: Oral Oral Oral Oral  SpO2: 100% 98% 98% 97%  Weight:      Height:       No intake or output data in the 24 hours ending 07/10/20 0719 Filed Weights   06/28/20 0611  Weight: 87.5 kg    Examination:  General exam: No acute distress.  Looks chronically ill. Respiratory system: Decreased breath sounds at bases bilaterally.   Cardiovascular system: Rate controlled, S1-S2 heard gastrointestinal system: Abdomen is distended slightly, soft, currently nontender.  Bowel sounds are hard  extremities: Mild lower extremity edema present; no clubbing  Data Reviewed: I have personally reviewed following labs and imaging studies  CBC: Recent Labs  Lab 07/05/20 0944 07/06/20 0345 07/08/20 0107  WBC 5.6 7.0 7.9  NEUTROABS  --   --  4.3  HGB 8.8* 9.1* 10.5*  HCT 25.0* 27.4* 30.6*  MCV 91.9 93.8 93.3  PLT 320 332 422*   Basic Metabolic Panel: Recent Labs  Lab 07/05/20 1005 07/08/20 0107  NA 137 135  K 2.9* 3.9  CL 97* 100  CO2 30 24  GLUCOSE 102* 102*  BUN 7* 7*  CREATININE 0.73 0.75  CALCIUM 8.9 9.2  MG  --  1.8   GFR: Estimated Creatinine Clearance: 72.1 mL/min (by C-G formula based on SCr of 0.75 mg/dL). Liver Function Tests: Recent Labs  Lab 07/05/20 1005 07/08/20 0107  AST 27 46*  ALT 11 15  ALKPHOS 60 74  BILITOT 0.8 0.8  PROT 5.4* 5.7*  ALBUMIN 2.7* 2.9*  Recent Labs  Lab 07/04/20 1751  LIPASE 20   No results for input(s): AMMONIA in the last 168 hours. Coagulation Profile: No results for input(s): INR, PROTIME in the last 168 hours. Cardiac Enzymes: No results for input(s): CKTOTAL, CKMB, CKMBINDEX, TROPONINI in the last 168 hours. BNP (last 3 results) No results for input(s): PROBNP in the last 8760 hours. HbA1C: No results for input(s): HGBA1C in the last 72 hours. CBG: No results for input(s): GLUCAP in the  last 168 hours. Lipid Profile: No results for input(s): CHOL, HDL, LDLCALC, TRIG, CHOLHDL, LDLDIRECT in the last 72 hours. Thyroid Function Tests: No results for input(s): TSH, T4TOTAL, FREET4, T3FREE, THYROIDAB in the last 72 hours. Anemia Panel: No results for input(s): VITAMINB12, FOLATE, FERRITIN, TIBC, IRON, RETICCTPCT in the last 72 hours. Sepsis Labs: No results for input(s): PROCALCITON, LATICACIDVEN in the last 168 hours.  No results found for this or any previous visit (from the past 240 hour(s)).       Radiology Studies: No results found.      Scheduled Meds: . acidophilus  2 capsule Oral Daily  . buPROPion  450 mg Oral Daily  . doxycycline  100 mg Oral Q12H  . enoxaparin (LOVENOX) injection  40 mg Subcutaneous Q24H  . losartan  100 mg Oral Daily   And  . hydrochlorothiazide  12.5 mg Oral Daily  . leflunomide  20 mg Oral QHS  . levothyroxine  100 mcg Oral Q Sat  . levothyroxine  200 mcg Oral Once per day on Mon Tue Wed Thu Fri  . pantoprazole  40 mg Oral BID  . pramipexole  0.125 mg Oral TID  . silver sulfADIAZINE   Topical BID  . sodium chloride flush  3 mL Intravenous Q12H   Continuous Infusions: . sodium chloride    . lactated ringers 50 mL/hr at 06/30/20 1457  . methocarbamol (ROBAXIN) IV            Glade Lloyd, MD [facility] 07/10/2020, 7:19 AM

## 2020-07-10 NOTE — Progress Notes (Signed)
Physical Therapy Treatment Patient Details Name: [name] MRN: [id number] DOB: 09/12/1955 Today's Date: 07/10/2020    History of Present Illness The pt is a 65 yo female presenting s/p anterolateal decompression and fusion of T12-L1 and L1-L2 on 1/06. Pt developed post operative L ptx and is now s/p chest tube insertion. PMH includes: anxiety, arthritis, HTN, vertigo, fibromyalgia, previous lumbar fusion (L3-sacrum), and bilateral TKA.    PT Comments    Per orders allow to trial chair and short distance amb to bathroom. Pt tolerated well from mobility stand point however continues to report nausea and "dim vision." Pt is anxious regarding mobility due to prolonged bed rest. Suspect pt will continue to progress well as nausea and activity tolerance improves as pt is functioning at min guard level. Encouraged pt to go to the bathroom with RN staff, no more bed pain use. Acute PT to cont to follow.    Follow Up Recommendations  Home health PT;Supervision/Assistance - 24 hour     Equipment Recommendations  Rolling walker with 5" wheels;3in1 (PT)    Recommendations for Other Services       Precautions / Restrictions Precautions Precautions: Fall;Back Precaution Booklet Issued: Yes (comment) Precaution Comments: watch out for orthostatic hypotension Required Braces or Orthoses: Spinal Brace Spinal Brace: Lumbar corset;Applied in sitting position Restrictions Weight Bearing Restrictions: No    Mobility  Bed Mobility Overal bed mobility: Needs Assistance Bed Mobility: Rolling;Sidelying to Sit;Sit to Sidelying Rolling: Min guard Sidelying to sit: Min guard       General bed mobility comments: increased time, able to manage LEs, HOB slightly elevated  Transfers Overall transfer level: Needs assistance Equipment used: Rolling walker (2 wheeled) Transfers: Sit to/from Stand Sit to Stand: Min assist Stand pivot transfers: Min assist       General transfer comment: pt  with good used of hands, to push up from bed (User may not have seen previous data.)  Ambulation/Gait Ambulation/Gait assistance: Min guard   Assistive device: Rolling walker (2 wheeled) Gait Pattern/deviations: Step-through pattern;Decreased step length - right;Decreased step length - left;Decreased stride length Gait velocity: decreased Gait velocity interpretation: <1.8 ft/sec, indicate of risk for recurrent falls General Gait Details: limited to 32feet per Dr. Danielle Dess, pt began to report "my vision starts to go dim" BP 117/74   Stairs             Wheelchair Mobility    Modified Rankin (Stroke Patients Only)       Balance Overall balance assessment: Needs assistance Sitting-balance support: Feet supported Sitting balance-Leahy Scale: Fair     Standing balance support: Bilateral upper extremity supported Standing balance-Leahy Scale: Poor Standing balance comment: reliant on BUE support and assist to steady in standing                            Cognition Arousal/Alertness: Awake/alert Behavior During Therapy: Anxious (anxious re: ambulating) Overall Cognitive Status: Within Functional Limits for tasks assessed                                        Exercises      General Comments General comments (skin integrity, edema, etc.): no drainage from incision, BP stable      Pertinent Vitals/Pain Pain Assessment: Faces Faces Pain Scale: Hurts a little bit Pain Location: back with mobility Pain Descriptors / Indicators: Discomfort;Sore  Home Living                      Prior Function            PT Goals (current goals can now be found in the care plan section) Acute Rehab PT Goals PT Goal Formulation: With patient Time For Goal Achievement: 07/13/20 Potential to Achieve Goals: Good Progress towards PT goals: Progressing toward goals    Frequency    Min 5X/week      PT Plan Current plan remains appropriate     Co-evaluation              AM-PAC PT "6 Clicks" Mobility   Outcome Measure  Help needed turning from your back to your side while in a flat bed without using bedrails?: A Little Help needed moving from lying on your back to sitting on the side of a flat bed without using bedrails?: A Little Help needed moving to and from a bed to a chair (including a wheelchair)?: A Little Help needed standing up from a chair using your arms (e.g., wheelchair or bedside chair)?: A Little Help needed to walk in hospital room?: A Little Help needed climbing 3-5 steps with a railing? : A Little 6 Click Score: 18    End of Session Equipment Utilized During Treatment: Gait belt;Back brace Activity Tolerance: Other (comment) (limited by orders and "dim vision") Patient left: in bed;with call bell/phone within reach;with family/visitor present;in chair Nurse Communication: Mobility status PT Visit Diagnosis: Other abnormalities of gait and mobility (R26.89);Pain Pain - part of body:  (Back and Headache)     Time: 6789-3810 PT Time Calculation (min) (ACUTE ONLY): 30 min  Charges:  $Gait Training: 8-22 mins $Therapeutic Activity: 8-22 mins                     [name], PT, DPT Acute Rehabilitation Services Pager #: [phone] Office #: [phone]    Iona Hansen 07/10/2020, 2:21 PM

## 2020-07-11 LAB — CBC WITH DIFFERENTIAL/PLATELET
Abs Immature Granulocytes: 0.07 10*3/uL (ref 0.00–0.07)
Basophils Absolute: 0.1 10*3/uL (ref 0.0–0.1)
Basophils Relative: 1 %
Eosinophils Absolute: 0.3 10*3/uL (ref 0.0–0.5)
Eosinophils Relative: 4 %
HCT: 32.9 % — ABNORMAL LOW (ref 36.0–46.0)
Hemoglobin: 10.7 g/dL — ABNORMAL LOW (ref 12.0–15.0)
Immature Granulocytes: 1 %
Lymphocytes Relative: 17 %
Lymphs Abs: 1.2 10*3/uL (ref 0.7–4.0)
MCH: 30.9 pg (ref 26.0–34.0)
MCHC: 32.5 g/dL (ref 30.0–36.0)
MCV: 95.1 fL (ref 80.0–100.0)
Monocytes Absolute: 0.7 10*3/uL (ref 0.1–1.0)
Monocytes Relative: 10 %
Neutro Abs: 4.8 10*3/uL (ref 1.7–7.7)
Neutrophils Relative %: 67 %
Platelets: 467 10*3/uL — ABNORMAL HIGH (ref 150–400)
RBC: 3.46 MIL/uL — ABNORMAL LOW (ref 3.87–5.11)
RDW: 13.8 % (ref 11.5–15.5)
WBC: 7.2 10*3/uL (ref 4.0–10.5)
nRBC: 0 % (ref 0.0–0.2)

## 2020-07-11 LAB — COMPREHENSIVE METABOLIC PANEL
ALT: 15 U/L (ref 0–44)
AST: 23 U/L (ref 15–41)
Albumin: 3 g/dL — ABNORMAL LOW (ref 3.5–5.0)
Alkaline Phosphatase: 84 U/L (ref 38–126)
Anion gap: 10 (ref 5–15)
BUN: 14 mg/dL (ref 8–23)
CO2: 26 mmol/L (ref 22–32)
Calcium: 9.2 mg/dL (ref 8.9–10.3)
Chloride: 97 mmol/L — ABNORMAL LOW (ref 98–111)
Creatinine, Ser: 1.01 mg/dL — ABNORMAL HIGH (ref 0.44–1.00)
GFR, Estimated: >60 mL/min (ref >60)
Glucose, Bld: 124 mg/dL — ABNORMAL HIGH (ref 70–99)
Potassium: 3.8 mmol/L (ref 3.5–5.1)
Sodium: 133 mmol/L — ABNORMAL LOW (ref 135–145)
Total Bilirubin: 0.4 mg/dL (ref 0.3–1.2)
Total Protein: 6 g/dL — ABNORMAL LOW (ref 6.5–8.1)

## 2020-07-11 LAB — LACTIC ACID, PLASMA: Lactic Acid, Venous: 1.4 mmol/L (ref 0.5–1.9)

## 2020-07-11 LAB — C-REACTIVE PROTEIN: CRP: 0.6 mg/dL (ref <1.0)

## 2020-07-11 LAB — PROCALCITONIN: Procalcitonin: <0.1 ng/mL

## 2020-07-11 MED ORDER — PROMETHAZINE HCL 25 MG/ML IJ SOLN
12.5000 mg | Freq: Four times a day (QID) | INTRAMUSCULAR | Status: DC | PRN
  Filled 2020-07-11: qty 1

## 2020-07-11 MED ORDER — DICYCLOMINE HCL 10 MG PO CAPS
10.0000 mg | ORAL_CAPSULE | Freq: Three times a day (TID) | ORAL | Status: DC
  Administered 2020-07-11 – 2020-07-13 (×7): 10 mg via ORAL
  Filled 2020-07-11 (×8): qty 1

## 2020-07-11 MED ORDER — HYDROXYZINE HCL 25 MG PO TABS
25.0000 mg | ORAL_TABLET | Freq: Four times a day (QID) | ORAL | Status: DC | PRN
  Administered 2020-07-11 – 2020-07-13 (×4): 25 mg via ORAL
  Filled 2020-07-11 (×4): qty 1

## 2020-07-11 NOTE — Progress Notes (Addendum)
OT Cancellation Note  Patient Details Name: [name] MRN: [id number] DOB: 1955/08/11   Cancelled Treatment:    Reason Eval/Treat Not Completed: Patient at procedure or test/ unavailable;Other (comment)Pt working with PT upon arrival, will check back as time allows for OT session.   Re attempted session at 1545 with pt asleep. Will continue efforts.   Lenor Derrick., COTA/L Acute Rehabilitation Services [phone]   Barron Schmid 07/11/2020, 11:23 AM

## 2020-07-11 NOTE — Progress Notes (Signed)
Physical Therapy Treatment Patient Details Name: [name] MRN: [id number] DOB: 04/11/1956 Today's Date: 07/11/2020    History of Present Illness The pt is a 65 yo female presenting s/p anterolateal decompression and fusion of T12-L1 and L1-L2 on 1/06. Pt developed post operative L ptx and is now s/p chest tube insertion. PMH includes: anxiety, arthritis, HTN, vertigo, fibromyalgia, previous lumbar fusion (L3-sacrum), and bilateral TKA.    PT Comments    Pt continues to be anxious regarding movement and onset of nausea. RN brought in new anti-nausea meds. Pt able to amb 60'x2 with RW. Pt denied nausea, no headache, and had no drainage from incision. Pt progressing well and functioning at min guard level. Pt completing seated LE HEP. Pt with supportive husband and good home set up. Will progress ambulation and complete stair negotiation at next session. Acute PT to cont to follow.    Follow Up Recommendations  Home health PT;Supervision/Assistance - 24 hour     Equipment Recommendations  Rolling walker with 5" wheels;3in1 (PT)    Recommendations for Other Services       Precautions / Restrictions Precautions Precautions: Fall;Back Precaution Booklet Issued: Yes (comment) Precaution Comments: watch out for orthostatic hypotension Required Braces or Orthoses: Spinal Brace Spinal Brace: Lumbar corset;Applied in sitting position (pt able to don indep) Restrictions Weight Bearing Restrictions: No Other Position/Activity Restrictions: no    Mobility  Bed Mobility Overal bed mobility: Needs Assistance Bed Mobility: Rolling;Sidelying to Sit;Sit to Sidelying Rolling: Supervision Sidelying to sit: Supervision       General bed mobility comments: increased time, able to manage LEs, HOB slightly elevated, used bed rail  Transfers Overall transfer level: Needs assistance Equipment used: Rolling walker (2 wheeled) Transfers: Sit to/from Stand Sit to Stand: Min guard          General transfer comment: pt with improved fluidity and less effort, verbal cues for safe hand placement  Ambulation/Gait Ambulation/Gait assistance: Min guard Gait Distance (Feet): 60 Feet (x2) Assistive device: Rolling walker (2 wheeled) Gait Pattern/deviations: Step-through pattern;Decreased step length - right;Decreased step length - left;Decreased stride length Gait velocity: decreased Gait velocity interpretation: <1.31 ft/sec, indicative of household ambulator General Gait Details: slow and guarded, denies nausea, nearing 20' pt stating "my legs are getting like jello", pt able to amb back to room after seated rest break   Stairs             Wheelchair Mobility    Modified Rankin (Stroke Patients Only)       Balance Overall balance assessment: Needs assistance Sitting-balance support: Feet supported Sitting balance-Leahy Scale: Good Sitting balance - Comments: pt able to don brace at EOB without LOB   Standing balance support: Bilateral upper extremity supported Standing balance-Leahy Scale: Poor Standing balance comment: reliant on BUE support for amb                            Cognition Arousal/Alertness: Awake/alert Behavior During Therapy: Anxious (anxious re: ambulating) Overall Cognitive Status: Within Functional Limits for tasks assessed                                        Exercises      General Comments General comments (skin integrity, edema, etc.): no frainage from incision, BP stable, no nausea today      Pertinent Vitals/Pain Pain Assessment: Faces  Faces Pain Scale: Hurts a little bit Pain Location: back with mobility Pain Descriptors / Indicators: Discomfort;Sore Pain Intervention(s): Monitored during session    Home Living                      Prior Function            PT Goals (current goals can now be found in the care plan section) Acute Rehab PT Goals PT Goal Formulation: With  patient Time For Goal Achievement: 07/13/20 Potential to Achieve Goals: Good Progress towards PT goals: Progressing toward goals    Frequency    Min 5X/week      PT Plan Current plan remains appropriate    Co-evaluation              AM-PAC PT "6 Clicks" Mobility   Outcome Measure  Help needed turning from your back to your side while in a flat bed without using bedrails?: A Little Help needed moving from lying on your back to sitting on the side of a flat bed without using bedrails?: A Little Help needed moving to and from a bed to a chair (including a wheelchair)?: A Little Help needed standing up from a chair using your arms (e.g., wheelchair or bedside chair)?: A Little Help needed to walk in hospital room?: A Little Help needed climbing 3-5 steps with a railing? : A Little 6 Click Score: 18    End of Session Equipment Utilized During Treatment: Gait belt;Back brace Activity Tolerance: Other (comment);Patient tolerated treatment well Patient left: in bed;with call bell/phone within reach;with family/visitor present;in chair Nurse Communication: Mobility status PT Visit Diagnosis: Other abnormalities of gait and mobility (R26.89);Pain     Time: 2703-5009 PT Time Calculation (min) (ACUTE ONLY): 15 min  Charges:  $Gait Training: 8-22 mins                     [name], PT, DPT Acute Rehabilitation Services Pager #: [phone] Office #: [phone]    [name] 07/11/2020, 12:44 PM

## 2020-07-11 NOTE — Progress Notes (Signed)
CONSULT PROGRESS NOTE  [name]:[id number] DOB: 1955/08/21 DOA: 06/28/2020 PCP: Georgianne Fick, MD   Requesting physician: Dr. Danielle Dess, neurosurgery. Reason for consult: Intractable abdominal pain and intractable nausea.  HPI/Recap of past 12 hours: 65 year old female patient with past medical history of rheumatoid arthritis, lumbar stenosis, hypothyroidism, fibromyalgia, HTN, presented with worsening back pain and underwent elective back surgery by neurosurgery on 06/28/2020.  TRH was consulted on 07/04/2020 because of worsening abdominal pain.  07/11/20: Seen and examined with her husband at her bedside.  She reports persistent diffuse abdominal pain associated with nausea but no vomiting.  Added Bentyl for abdominal spasm and IV Phenergan as needed for intractable nausea.  Vistaril is available as needed for nausea not responding to Zofran.      Assessment/Plan: Active Problems:   Status post lumbar spinal fusion   Thoracic spondylosis with myelopathy   Right lower quadrant abdominal pain    Intractable abdominal pain/intractable nausea Unclear etiology Work-up has been unremarkable LFTs are normal Imaging nonrevealing Started on Bentyl on 07/11/2020 for abdominal spasm Started IV Phenergan as needed for intractable nausea and vomiting. Will continue to monitor and assist as indicated.  Scoliosis with lumbar spinal stenosis status post T12-L2 decompression -Management as per primary neurosurgical team.  Patient was put on doxycycline by neurosurgery on 07/05/2020 for drainage from the surgical site.   Rheumatoid arthritis -Continue home regimen.  Outpatient follow-up with rheumatology  Hypertension -Blood pressure stable.  Continue losartan and hydrochlorothiazide  Hypothyroidism -Continue levothyroxine    Code Status: Full code  Family Communication: Updated husband at bedside.  Disposition Plan: Per primary team.   Consultants:  TRH is  consulting.  Procedures: Status post T12-L2 decompression  Antimicrobials:  Doxycycline  DVT prophylaxis: Subcu Lovenox daily    Thank you for allowing [place] to participate in the care of this patient.  We will continue to follow along with you.   Objective: Vitals:   07/11/20 0007 07/11/20 0354 07/11/20 0811 07/11/20 1148  BP: (!) 129/59 (!) 118/59 110/66 (!) 114/56  Pulse: 82 82 98 86  Resp: 18 18 16 16   Temp: 98.1 F (36.7 C) 98.4 F (36.9 C) 98 F (36.7 C) 98.2 F (36.8 C)  TempSrc: Oral Oral Oral Oral  SpO2: 98% 99% 99% 98%  Weight:      Height:       No intake or output data in the 24 hours ending 07/11/20 1542 Filed Weights   06/28/20 0611  Weight: 87.5 kg    Exam:  . General: 65 y.o. year-old female well developed well nourished in no acute distress.  Alert and oriented x3. . Cardiovascular: Regular rate and rhythm with no rubs or gallops.  No thyromegaly or JVD noted.   76 Respiratory: Clear to auscultation with no wheezes or rales. Good inspiratory effort. . Abdomen: Soft diffuse tenderness worse on right upper quadrant nondistended with normal bowel sounds x4 quadrants. . Musculoskeletal: No lower extremity edema bilaterally.   Marland Kitchen Psychiatry: Mood is appropriate for condition and setting   Data Reviewed: CBC: Recent Labs  Lab 07/05/20 0944 07/06/20 0345 07/08/20 0107 07/11/20 1358  WBC 5.6 7.0 7.9 7.2  NEUTROABS  --   --  4.3 4.8  HGB 8.8* 9.1* 10.5* 10.7*  HCT 25.0* 27.4* 30.6* 32.9*  MCV 91.9 93.8 93.3 95.1  PLT 320 332 422* 467*   Basic Metabolic Panel: Recent Labs  Lab 07/05/20 1005 07/08/20 0107 07/11/20 1358  NA 137 135 133*  K 2.9*  3.9 3.8  CL 97* 100 97*  CO2 30 24 26   GLUCOSE 102* 102* 124*  BUN 7* 7* 14  CREATININE 0.73 0.75 1.01*  CALCIUM 8.9 9.2 9.2  MG  --  1.8  --    GFR: Estimated Creatinine Clearance: 57.1 mL/min (A) (by C-G formula based on SCr of 1.01 mg/dL (H)). Liver Function Tests: Recent Labs  Lab  07/05/20 1005 07/08/20 0107 07/11/20 1358  AST 27 46* 23  ALT 11 15 15   ALKPHOS 60 74 84  BILITOT 0.8 0.8 0.4  PROT 5.4* 5.7* 6.0*  ALBUMIN 2.7* 2.9* 3.0*   Recent Labs  Lab 07/04/20 1751  LIPASE 20   No results for input(s): AMMONIA in the last 168 hours. Coagulation Profile: No results for input(s): INR, PROTIME in the last 168 hours. Cardiac Enzymes: No results for input(s): CKTOTAL, CKMB, CKMBINDEX, TROPONINI in the last 168 hours. BNP (last 3 results) No results for input(s): PROBNP in the last 8760 hours. HbA1C: No results for input(s): HGBA1C in the last 72 hours. CBG: No results for input(s): GLUCAP in the last 168 hours. Lipid Profile: No results for input(s): CHOL, HDL, LDLCALC, TRIG, CHOLHDL, LDLDIRECT in the last 72 hours. Thyroid Function Tests: No results for input(s): TSH, T4TOTAL, FREET4, T3FREE, THYROIDAB in the last 72 hours. Anemia Panel: No results for input(s): VITAMINB12, FOLATE, FERRITIN, TIBC, IRON, RETICCTPCT in the last 72 hours. Urine analysis:    Component Value Date/Time   COLORURINE YELLOW 07/04/2020 1033   APPEARANCEUR CLEAR 07/04/2020 1033   LABSPEC 1.015 07/04/2020 1033   PHURINE 6.0 07/04/2020 1033   GLUCOSEU NEGATIVE 07/04/2020 1033   HGBUR NEGATIVE 07/04/2020 1033   BILIRUBINUR NEGATIVE 07/04/2020 1033   KETONESUR NEGATIVE 07/04/2020 1033   PROTEINUR NEGATIVE 07/04/2020 1033   UROBILINOGEN 0.2 02/20/2015 1028   NITRITE NEGATIVE 07/04/2020 1033   LEUKOCYTESUR NEGATIVE 07/04/2020 1033   Sepsis Labs: @LABRCNTIP (procalcitonin:4,lacticidven:4)  )No results found for this or any previous visit (from the past 240 hour(s)).    Studies: No results found.  Scheduled Meds: . acidophilus  2 capsule Oral Daily  . buPROPion  450 mg Oral Daily  . doxycycline  100 mg Oral Q12H  . enoxaparin (LOVENOX) injection  40 mg Subcutaneous Q24H  . losartan  100 mg Oral Daily   And  . hydrochlorothiazide  12.5 mg Oral Daily  . leflunomide  20  mg Oral QHS  . levothyroxine  100 mcg Oral Q Sat  . levothyroxine  200 mcg Oral Once per day on Mon Tue Wed Thu Fri  . pantoprazole  40 mg Oral BID  . pramipexole  0.125 mg Oral TID  . senna-docusate  1 tablet Oral BID  . silver sulfADIAZINE   Topical BID  . sodium chloride flush  3 mL Intravenous Q12H    Continuous Infusions: . sodium chloride    . lactated ringers 50 mL/hr at 06/30/20 1457  . methocarbamol (ROBAXIN) IV       LOS: 13 days     Fri, MD [facility] Pager [phone]  If 7PM-7AM, please contact night-coverage [email or web address] Password [facility] 07/11/2020, 3:42 PM

## 2020-07-12 MED ORDER — OXYCODONE-ACETAMINOPHEN 5-325 MG PO TABS
1.0000 | ORAL_TABLET | ORAL | Status: DC | PRN
  Administered 2020-07-12 (×4): 2 via ORAL
  Administered 2020-07-13: 1 via ORAL
  Administered 2020-07-13 (×2): 2 via ORAL
  Filled 2020-07-12 (×7): qty 2

## 2020-07-12 NOTE — TOC Progression Note (Signed)
Transition of Care [facility]) - Progression Note    Patient Details  Name: [name] MRN: [id number] Date of Birth: [birth year]  [facility]) CM/SW Contact  Beckie Busing, RN Phone Number: [phone]  07/12/2020, 4:13 PM  Clinical Narrative:    Home Health has been set up with [facility]. Info has been added to avs.        Expected Discharge Plan and Services                                     HH Arranged: PT,OT HH Agency: [facility] Date [facility] Agency Contacted: 07/12/20 Time HH Agency Contacted: 1612 Representative spoke with at [facility] Agency: Kandee Keen   Social Determinants of Health (SDOH) Interventions    Readmission Risk Interventions No flowsheet data found.

## 2020-07-12 NOTE — Progress Notes (Signed)
Occupational Therapy Treatment Patient Details Name: [name] MRN: [id number] DOB: [birth year] Today's Date: 07/12/2020    History of present illness The pt is a 65 yo female presenting s/p anterolateal decompression and fusion of T12-L1 and L1-L2 on 1/06. Pt developed post operative L ptx and is now s/p chest tube insertion. PMH includes: anxiety, arthritis, HTN, vertigo, fibromyalgia, previous lumbar fusion (L3-sacrum), and bilateral TKA.   OT comments  OT treatment session with focus on self-care re-education, ADL transfers, and pain management. Patient reporting 6/10 pain at rest and with activity s/p premedication. Patient able to complete all functional transfers, observed ADLs, and short distance functional mobility with use of RW and supervision A. Education provided on acquisition and use of LB AE to maximize independence and increase adherence to back precautions. Further education and demonatration to be provided at next session. Patient hesitant about d/c home 2/2 pain despite encouragement. Patient would benefit from continued acute OT services in prep for safe d/c home with [facility] and assist from husband as needed.    Follow Up Recommendations  Home health OT;Supervision/Assistance - 24 hour    Equipment Recommendations  3 in 1 bedside commode    Recommendations for Other Services      Precautions / Restrictions Precautions Precautions: Fall;Back Precaution Booklet Issued: Yes (comment) Precaution Comments: watch out for orthostatic hypotension Required Braces or Orthoses: Spinal Brace Spinal Brace: Lumbar corset;Applied in sitting position (pt able to don indep) Restrictions Weight Bearing Restrictions: No       Mobility Bed Mobility Overal bed mobility: Needs Assistance             General bed mobility comments: Seated in recliner upon entry.  Transfers Overall transfer level: Needs assistance Equipment used: Rolling walker (2 wheeled) Transfers: Sit  to/from Stand Sit to Stand: Supervision         General transfer comment: S from recliner x2 and from [facility]. Good recall of hand placement and technique.    Balance Overall balance assessment: Needs assistance Sitting-balance support: Feet supported Sitting balance-Leahy Scale: Good Sitting balance - Comments: pt able to don brace in unsupportd sitting position without LOB   Standing balance support: Bilateral upper extremity supported Standing balance-Leahy Scale: Fair Standing balance comment: Able to maintain standing balance without UE support during grooming standing at sink.                           ADL either performed or assessed with clinical judgement   ADL                           Toilet Transfer: Supervision/safety Toilet Transfer Details (indicate cue type and reason): Supervision A with RW. Toileting- Clothing Manipulation and Hygiene: Supervision/safety Toileting - Clothing Manipulation Details (indicate cue type and reason): S for safety. Good adherence to back precautions during hygiene/clothing management..     Functional mobility during ADLs: Supervision/safety General ADL Comments: S in hospital room with use of RW.     Vision       Perception     Praxis      Cognition Arousal/Alertness: Awake/alert Behavior During Therapy: Anxious (anxious 2/2 pain/nausea) Overall Cognitive Status: Within Functional Limits for tasks assessed  Exercises     Shoulder Instructions       General Comments Clean, dry dressing at incision.    Pertinent Vitals/ Pain       Pain Assessment: 0-10 Pain Score: 6  Pain Location: back with mobility Pain Descriptors / Indicators: Discomfort;Sore  Home Living                                          Prior Functioning/Environment              Frequency  Min 2X/week        Progress Toward Goals  OT  Goals(current goals can now be found in the care plan section)  Progress towards OT goals: Progressing toward goals  Acute Rehab OT Goals Patient Stated Goal: Decrease pain OT Goal Formulation: With patient/family Time For Goal Achievement: 07/26/20 Potential to Achieve Goals: Good ADL Goals Additional ADL Goal #2: Patient will don LB clothing in sitting/standing with supervision A and use of LRAD and AE PRN.  Plan Discharge plan remains appropriate;Frequency remains appropriate    Co-evaluation                 AM-PAC OT "6 Clicks" Daily Activity     Outcome Measure   Help from another person eating meals?: A Little Help from another person taking care of personal grooming?: A Little Help from another person toileting, which includes using toliet, bedpan, or urinal?: A Little Help from another person bathing (including washing, rinsing, drying)?: A Little Help from another person to put on and taking off regular upper body clothing?: A Little Help from another person to put on and taking off regular lower body clothing?: A Little 6 Click Score: 18    End of Session Equipment Utilized During Treatment: Rolling walker;Back brace  OT Visit Diagnosis: Unsteadiness on feet (R26.81);Pain Pain - part of body:  (Low back (incisional))   Activity Tolerance Patient tolerated treatment well   Patient Left in chair;with call bell/phone within reach;with family/visitor present (Husband Jimmy present at bedside.)   Nurse Communication          Time: [phone] OT Time Calculation (min): 17 min  Charges: OT General Charges $OT Visit: 1 Visit OT Treatments $Self Care/Home Management : 8-22 mins  [name] H. OTR/L Supplemental OT, Department of rehab services [phone]   Brettney Ficken R H. 07/12/2020, 8:38 AM

## 2020-07-12 NOTE — Progress Notes (Signed)
Patient ID: [name], female   DOB: 06-Dec-1955, 65 y.o.   MRN: [id number] Patient had worsening episode of back pain early this morning not relieved with hydrocodone and required some morphine.  Pain medication has been changed to Percocet and now seems to be doing well however this morning patient was noted to have hypotension and cannot fully participate with PT currently she is reasonably comfortable and dressing changed today demonstrates that the incision is continuing to heal nicely no drainage has been noted.  We will observe the patient clinically and continue to reassess her ability to ambulate independently and plans for discharge home.

## 2020-07-12 NOTE — Progress Notes (Signed)
Physical Therapy Treatment Patient Details Name: [name] MRN: [id number] DOB: 02/18/1956 Today's Date: 07/12/2020    History of Present Illness The pt is a 65 yo female presenting s/p anterolateal decompression and fusion of T12-L1 and L1-L2 on 1/06. Pt developed post operative L ptx and is now s/p chest tube insertion. PMH includes: anxiety, arthritis, HTN, vertigo, fibromyalgia, previous lumbar fusion (L3-sacrum), and bilateral TKA.    PT Comments    Patient received in bed, very pleasant and cooperative today. Able to mobilize on a supervision level for bed mobility and transfers, however once standing felt nauseated and dizzy. Orthostatics taken as follows:  Sitting at EOB 115/87  Standing 91/57  Standing x3 minutes 83/50  Return to sitting 116/66  Session limited by symptomatic BP drop. Patient returned to bed (feels more comfortable waiting until her husband gets back to get back up to the chair), and RN and MD alerted regarding findings today. Will continue to follow.     Follow Up Recommendations  Home health PT;Supervision/Assistance - 24 hour     Equipment Recommendations  Rolling walker with 5" wheels;3in1 (PT)    Recommendations for Other Services       Precautions / Restrictions Precautions Precautions: Fall;Back Precaution Booklet Issued: Yes (comment) Precaution Comments: watch out for orthostatic hypotension Required Braces or Orthoses: Spinal Brace Spinal Brace: Lumbar corset;Applied in sitting position Restrictions Weight Bearing Restrictions: No    Mobility  Bed Mobility Overal bed mobility: Needs Assistance Bed Mobility: Rolling;Sidelying to Sit;Sit to Sidelying Rolling: Supervision Sidelying to sit: Supervision     Sit to sidelying: Supervision General bed mobility comments: S for safety, demonstrated good carryover of technique and precautions with bed mobility  Transfers Overall transfer level: Needs assistance Equipment used:  Rolling walker (2 wheeled) Transfers: Sit to/from Stand Sit to Stand: Supervision         General transfer comment: S for safefty, no physical assist given, good carryover of hand placement and technique  Ambulation/Gait             General Gait Details: deferred- symptomatic orthostatic hypotension   Stairs             Wheelchair Mobility    Modified Rankin (Stroke Patients Only)       Balance Overall balance assessment: Needs assistance Sitting-balance support: Feet supported Sitting balance-Leahy Scale: Good Sitting balance - Comments: pt able to don brace in unsupportd sitting position without LOB   Standing balance support: Bilateral upper extremity supported Standing balance-Leahy Scale: Fair Standing balance comment: Able to maintain standing balance without UE support during grooming standing at sink.                            Cognition Arousal/Alertness: Awake/alert Behavior During Therapy: WFL for tasks assessed/performed Overall Cognitive Status: Within Functional Limits for tasks assessed                                 General Comments: calm but just frustrated regarding how long she has been in the hospital and return of orthostatic hypotension      Exercises      General Comments General comments (skin integrity, edema, etc.): Clean, dry dressing at incision.      Pertinent Vitals/Pain Pain Assessment: No/denies pain Pain Score: 4  Pain Location: back with mobility Pain Descriptors / Indicators: Discomfort;Sore Pain Intervention(s): Limited activity  within patient's tolerance;Monitored during session    Home Living                      Prior Function            PT Goals (current goals can now be found in the care plan section) Acute Rehab PT Goals Patient Stated Goal: Decrease pain PT Goal Formulation: With patient Time For Goal Achievement: 07/13/20 Potential to Achieve Goals:  Good Progress towards PT goals: Not progressing toward goals - comment (limited by orthostatic hypotension today)    Frequency    Min 5X/week      PT Plan Current plan remains appropriate    Co-evaluation              AM-PAC PT "6 Clicks" Mobility   Outcome Measure  Help needed turning from your back to your side while in a flat bed without using bedrails?: None Help needed moving from lying on your back to sitting on the side of a flat bed without using bedrails?: A Little Help needed moving to and from a bed to a chair (including a wheelchair)?: A Little Help needed standing up from a chair using your arms (e.g., wheelchair or bedside chair)?: A Little Help needed to walk in hospital room?: A Little Help needed climbing 3-5 steps with a railing? : A Little 6 Click Score: 19    End of Session Equipment Utilized During Treatment: Back brace Activity Tolerance: Treatment limited secondary to medical complications (Comment) (limited by symptomatic orthostatic hypotension) Patient left: in bed;with call bell/phone within reach Nurse Communication: Mobility status;Other (comment) (orthostatics) PT Visit Diagnosis: Other abnormalities of gait and mobility (R26.89);Pain Pain - part of body:  (back and headache)     Time: 6578-4696 PT Time Calculation (min) (ACUTE ONLY): 23 min  Charges:  $Therapeutic Activity: 23-37 mins                     Madelaine Etienne, DPT, PN1   Supplemental Physical Therapist [facility]    Pager [phone] Acute Rehab Office [phone]

## 2020-07-12 NOTE — Progress Notes (Addendum)
CONSULT PROGRESS NOTE  BRIE EPPARD IRS:[id number] DOB: 01-31-1956 DOA: 06/28/2020 PCP: Georgianne Fick, MD   Requesting physician: Dr. Danielle Dess, neurosurgery. Reason for consult: Intractable abdominal pain and intractable nausea.  HPI/Recap of past 33 hours: 65 year old female patient with past medical history of rheumatoid arthritis, lumbar stenosis, hypothyroidism, fibromyalgia, HTN, presented with worsening back pain and underwent elective back surgery by neurosurgery on 06/28/2020.  TRH was consulted on 07/04/2020 because of worsening abdominal pain.  Added Bentyl for abdominal spasm and IV Phenergan as needed for intractable nausea on 07/11/20.  Vistaril is available as needed for nausea not responding to Zofran.  07/12/20: Patient was seen and examined at her bedside.  Her husband was present.  Abdominal pain and nausea are improved.  She reports significant pain in her back around 4 o'clock this morning.  Improving now with changes in her pain medication.  She requests Percocet.  Bowel regimen in place.  Was seen by PT OT with recommendation for home health PT OT and 24-hour supervision.  Also recommendation for DME rolling walker and 3 in 1 commode, ordered.  TOC consulted to assist with home health services and DME arrangement.      Assessment/Plan: Active Problems:   Status post lumbar spinal fusion   Thoracic spondylosis with myelopathy   Right lower quadrant abdominal pain    Improved intractable abdominal pain/intractable nausea Likely secondary to abdominal spasm. Work-up has been unremarkable LFTs are normal Imaging nonrevealing Started on Bentyl on 07/11/2020 for abdominal spasm Started IV Phenergan as needed for intractable nausea and vomiting. Will continue to monitor and assist as indicated.  Scoliosis with lumbar spinal stenosis status post T12-L2 decompression -Management as per primary neurosurgical team.  Patient was put on doxycycline by neurosurgery on  07/05/2020 for drainage from the surgical site.  Pain control in place, Percocet added at patient's request. Continue bowel regimen.  Rheumatoid arthritis -Continue home regimen.  Outpatient follow-up with rheumatology  Hypertension -Blood pressure stable.  Continue losartan and hydrochlorothiazide  Hypothyroidism -Continue levothyroxine  Ambulated dysfunction PT OT assessed and recommended home health PT OT Continue PT OT with assistance and fall precautions TOC consulted to assist with home health services and DME arrangement    Code Status: Full code  Family Communication: Updated husband at bedside.  Disposition Plan: Per primary team.   Consultants:  TRH is consulting.  Procedures: Status post T12-L2 decompression  Antimicrobials:  Doxycycline  DVT prophylaxis: Subcu Lovenox daily    Thank you for allowing [place] to participate in the care of this patient.  We will continue to follow along with you.   Objective: Vitals:   07/11/20 1957 07/11/20 2252 07/12/20 0352 07/12/20 0742  BP: (!) 116/55 (!) 124/51 111/61 (!) 115/49  Pulse: 85 86 78 79  Resp: 18 18 18 18   Temp: 98.3 F (36.8 C) 98.4 F (36.9 C) 98.1 F (36.7 C) 98.2 F (36.8 C)  TempSrc: Oral Oral Oral Oral  SpO2: 100% 98% 99% 99%  Weight:      Height:       No intake or output data in the 24 hours ending 07/12/20 1018 Filed Weights   06/28/20 0611  Weight: 87.5 kg    Exam:  . General: 65 y.o. year-old female well-developed well-nourished in no acute distress.  Alert and oriented x3. . Cardiovascular: Regular rate and rhythm no rubs or gallops.  76 Respiratory: Clear to auscultation no wheezes or rales.   . Abdomen: Soft nontender normal bowel sounds present.  Marland Kitchen  Musculoskeletal: No lower extremity edema bilaterally. Marland Kitchen Psychiatry: Mood is appropriate for condition and setting.   Data Reviewed: CBC: Recent Labs  Lab 07/06/20 0345 07/08/20 0107 07/11/20 1358  WBC 7.0 7.9 7.2   NEUTROABS  --  4.3 4.8  HGB 9.1* 10.5* 10.7*  HCT 27.4* 30.6* 32.9*  MCV 93.8 93.3 95.1  PLT 332 422* 467*   Basic Metabolic Panel: Recent Labs  Lab 07/08/20 0107 07/11/20 1358  NA 135 133*  K 3.9 3.8  CL 100 97*  CO2 24 26  GLUCOSE 102* 124*  BUN 7* 14  CREATININE 0.75 1.01*  CALCIUM 9.2 9.2  MG 1.8  --    GFR: Estimated Creatinine Clearance: 57.1 mL/min (A) (by C-G formula based on SCr of 1.01 mg/dL (H)). Liver Function Tests: Recent Labs  Lab 07/08/20 0107 07/11/20 1358  AST 46* 23  ALT 15 15  ALKPHOS 74 84  BILITOT 0.8 0.4  PROT 5.7* 6.0*  ALBUMIN 2.9* 3.0*   No results for input(s): LIPASE, AMYLASE in the last 168 hours. No results for input(s): AMMONIA in the last 168 hours. Coagulation Profile: No results for input(s): INR, PROTIME in the last 168 hours. Cardiac Enzymes: No results for input(s): CKTOTAL, CKMB, CKMBINDEX, TROPONINI in the last 168 hours. BNP (last 3 results) No results for input(s): PROBNP in the last 8760 hours. HbA1C: No results for input(s): HGBA1C in the last 72 hours. CBG: No results for input(s): GLUCAP in the last 168 hours. Lipid Profile: No results for input(s): CHOL, HDL, LDLCALC, TRIG, CHOLHDL, LDLDIRECT in the last 72 hours. Thyroid Function Tests: No results for input(s): TSH, T4TOTAL, FREET4, T3FREE, THYROIDAB in the last 72 hours. Anemia Panel: No results for input(s): VITAMINB12, FOLATE, FERRITIN, TIBC, IRON, RETICCTPCT in the last 72 hours. Urine analysis:    Component Value Date/Time   COLORURINE YELLOW 07/04/2020 1033   APPEARANCEUR CLEAR 07/04/2020 1033   LABSPEC 1.015 07/04/2020 1033   PHURINE 6.0 07/04/2020 1033   GLUCOSEU NEGATIVE 07/04/2020 1033   HGBUR NEGATIVE 07/04/2020 1033   BILIRUBINUR NEGATIVE 07/04/2020 1033   KETONESUR NEGATIVE 07/04/2020 1033   PROTEINUR NEGATIVE 07/04/2020 1033   UROBILINOGEN 0.2 02/20/2015 1028   NITRITE NEGATIVE 07/04/2020 1033   LEUKOCYTESUR NEGATIVE 07/04/2020 1033    Sepsis Labs: @LABRCNTIP (procalcitonin:4,lacticidven:4)  )No results found for this or any previous visit (from the past 240 hour(s)).    Studies: No results found.  Scheduled Meds: . acidophilus  2 capsule Oral Daily  . buPROPion  450 mg Oral Daily  . dicyclomine  10 mg Oral TID AC & HS  . doxycycline  100 mg Oral Q12H  . enoxaparin (LOVENOX) injection  40 mg Subcutaneous Q24H  . losartan  100 mg Oral Daily   And  . hydrochlorothiazide  12.5 mg Oral Daily  . leflunomide  20 mg Oral QHS  . levothyroxine  100 mcg Oral Q Sat  . levothyroxine  200 mcg Oral Once per day on Mon Tue Wed Thu Fri  . pantoprazole  40 mg Oral BID  . pramipexole  0.125 mg Oral TID  . senna-docusate  1 tablet Oral BID  . silver sulfADIAZINE   Topical BID  . sodium chloride flush  3 mL Intravenous Q12H    Continuous Infusions: . sodium chloride    . lactated ringers 50 mL/hr at 06/30/20 1457  . methocarbamol (ROBAXIN) IV       LOS: 14 days     08/28/20, MD [facility] Pager [phone]  If 7PM-7AM, please contact night-coverage [email or web address] Password [email or web address] 07/12/2020, 10:18 AM

## 2020-07-13 MED ORDER — OXYCODONE-ACETAMINOPHEN 5-325 MG PO TABS: 1.0000 | ORAL_TABLET | ORAL | 0 refills | Status: DC | PRN

## 2020-07-13 MED ORDER — DICYCLOMINE HCL 10 MG/5ML PO SOLN
10.0000 mg | Freq: Once | ORAL | Status: AC
  Administered 2020-07-13: 10 mg via ORAL
  Filled 2020-07-13: qty 5

## 2020-07-13 MED ORDER — DOXYCYCLINE HYCLATE 100 MG PO TABS: 100.0000 mg | ORAL_TABLET | Freq: Two times a day (BID) | ORAL | 0 refills | Status: DC

## 2020-07-13 MED ORDER — SODIUM CHLORIDE 0.9 % IV SOLN: INTRAVENOUS | Status: DC

## 2020-07-13 MED ORDER — HYDROXYZINE HCL 25 MG PO TABS: 25.0000 mg | ORAL_TABLET | Freq: Four times a day (QID) | ORAL | 3 refills | Status: DC | PRN

## 2020-07-13 MED ORDER — DICYCLOMINE HCL 10 MG PO CAPS: 10.0000 mg | ORAL_CAPSULE | Freq: Three times a day (TID) | ORAL | 3 refills | Status: DC

## 2020-07-13 NOTE — Progress Notes (Signed)
Patient is discharged from room 3C11 at this time. Alert and in stable condition. IV site d/c'd, instructions read to patient and spouse, dressing change education also done with understanding verbalized and all questions answered. Left unit via wheelchair with all belongings at side.

## 2020-07-13 NOTE — Progress Notes (Signed)
Patient ID: [name], female   DOB: [birth year], 65 y.o.   MRN: [id number] Vital signs are stable today patient feels well and is wishing to go home Her incision has been clean and dry and I have been observing it daily I believe that this is well on the way to healing.  We will send him home with Percocet for pain medication Vistaril as an antinausea medication and dicyclomine for her abdomen and bowel pain.  I also plan on leaving her on doxycycline for the next 10 days time.

## 2020-07-13 NOTE — Discharge Instructions (Signed)

## 2020-07-13 NOTE — Progress Notes (Addendum)
CONSULT PROGRESS NOTE  [name]:[id number] DOB: [birth year] DOA: 06/28/2020 PCP: Georgianne Fick, MD   Requesting physician: Dr. Danielle Dess, neurosurgery. Reason for consult: Intractable abdominal pain and intractable nausea.  HPI/Recap of past 64 hours: 65 year old female patient with past medical history of rheumatoid arthritis, lumbar stenosis, hypothyroidism, fibromyalgia, HTN, presented with worsening back pain and underwent elective back surgery by neurosurgery on 06/28/2020.  TRH was consulted on 07/04/2020 because of worsening abdominal pain.  Added Bentyl for abdominal spasm and IV Phenergan as needed for intractable nausea on 07/11/20.  Vistaril is available as needed for nausea not responding to Zofran.  Positive orthostatic vital signs on 07/12/2020 with related vertigo for which she was started on normal saline at 75 cc/h.  Patient advised to avoid dehydration.  She understands and agrees with plan.  07/13/20: She states she feels better this morning. She has an abdominal binder in place. She is eager to go home.   Assessment/Plan: Active Problems:   Status post lumbar spinal fusion   Thoracic spondylosis with myelopathy   Right lower quadrant abdominal pain   Resolved intractable abdominal pain/intractable nausea Likely secondary to abdominal spasm. LFTs, lactic acid, CRP, procalcitonin, all normal Imaging nonrevealing Started on Bentyl on 07/11/2020 for abdominal spasm with improvement of symptomatology.  GERD Continue home PPI Received a dose of GI cocktail on 07/13/2020 Advised to avoid triggers, to wait at least 2 hours after eating to lay down in bed and to increase the head of her bed angle to at least 30 degree to avoid exacerbation.  Scoliosis with lumbar spinal stenosis status post T12-L2 decompression -Management as per primary neurosurgical team.  Patient was put on doxycycline by neurosurgery on 07/05/2020 for drainage from the surgical site. She is also  on probiotics. Pain control in place, Percocet added at patient's request. Bowel regimen also in place to avoid opioid-induced constipation.  Rheumatoid arthritis -Continue home regimen.   -Outpatient follow-up with rheumatology  Hypertension -Blood pressure stable.   -Continue losartan and hydrochlorothiazide  Hypothyroidism -TSH 2.404 on 06/24/20. -Continue home levothyroxine  Ambulated dysfunction PT OT assessed and recommended home health PT OT Continue PT OT with assistance and fall precautions TOC consulted to assist with home health services and DMEs arrangement    Code Status: Full code  Family Communication: Updated husband at bedside on 07/13/2020.  Disposition Plan: Per primary team.   Consultants:  TRH is consulting.  Procedures: Status post T12-L2 decompression  Antimicrobials:  Doxycycline  DVT prophylaxis: Subcu Lovenox daily    Thank you for allowing [place] to participate in the care of this patient.  We are signing off.   Objective: Vitals:   07/13/20 0617 07/13/20 0619 07/13/20 0720 07/13/20 1154  BP: 125/68 122/72 137/68 (!) 105/51  Pulse: 73 100 81 84  Resp:   18 18  Temp:   98.7 F (37.1 C) 98.3 F (36.8 C)  TempSrc:   Oral Oral  SpO2:   100% 100%  Weight:      Height:        Intake/Output Summary (Last 24 hours) at 07/13/2020 1221 Last data filed at 07/13/2020 9562 Gross per 24 hour  Intake 750 ml  Output --  Net 750 ml   Filed Weights   06/28/20 0611  Weight: 87.5 kg    Exam: No significant changes.  . General: 65 y.o. year-old female well-developed well-nourished in no acute distress.  Alert and oriented x3. . Cardiovascular: Regular rate and rhythm no rubs or  gallops.  Marland Kitchen Respiratory: Clear to auscultation no wheezes or rales.   . Abdomen: Soft nontender normal bowel sounds present.  . Musculoskeletal: No lower extremity edema bilaterally. Marland Kitchen Psychiatry: Mood is appropriate for condition and setting.   Data  Reviewed: CBC: Recent Labs  Lab 07/08/20 0107 07/11/20 1358  WBC 7.9 7.2  NEUTROABS 4.3 4.8  HGB 10.5* 10.7*  HCT 30.6* 32.9*  MCV 93.3 95.1  PLT 422* 467*   Basic Metabolic Panel: Recent Labs  Lab 07/08/20 0107 07/11/20 1358  NA 135 133*  K 3.9 3.8  CL 100 97*  CO2 24 26  GLUCOSE 102* 124*  BUN 7* 14  CREATININE 0.75 1.01*  CALCIUM 9.2 9.2  MG 1.8  --    GFR: Estimated Creatinine Clearance: 57.1 mL/min (A) (by C-G formula based on SCr of 1.01 mg/dL (H)). Liver Function Tests: Recent Labs  Lab 07/08/20 0107 07/11/20 1358  AST 46* 23  ALT 15 15  ALKPHOS 74 84  BILITOT 0.8 0.4  PROT 5.7* 6.0*  ALBUMIN 2.9* 3.0*   No results for input(s): LIPASE, AMYLASE in the last 168 hours. No results for input(s): AMMONIA in the last 168 hours. Coagulation Profile: No results for input(s): INR, PROTIME in the last 168 hours. Cardiac Enzymes: No results for input(s): CKTOTAL, CKMB, CKMBINDEX, TROPONINI in the last 168 hours. BNP (last 3 results) No results for input(s): PROBNP in the last 8760 hours. HbA1C: No results for input(s): HGBA1C in the last 72 hours. CBG: No results for input(s): GLUCAP in the last 168 hours. Lipid Profile: No results for input(s): CHOL, HDL, LDLCALC, TRIG, CHOLHDL, LDLDIRECT in the last 72 hours. Thyroid Function Tests: No results for input(s): TSH, T4TOTAL, FREET4, T3FREE, THYROIDAB in the last 72 hours. Anemia Panel: No results for input(s): VITAMINB12, FOLATE, FERRITIN, TIBC, IRON, RETICCTPCT in the last 72 hours. Urine analysis:    Component Value Date/Time   COLORURINE YELLOW 07/04/2020 1033   APPEARANCEUR CLEAR 07/04/2020 1033   LABSPEC 1.015 07/04/2020 1033   PHURINE 6.0 07/04/2020 1033   GLUCOSEU NEGATIVE 07/04/2020 1033   HGBUR NEGATIVE 07/04/2020 1033   BILIRUBINUR NEGATIVE 07/04/2020 1033   KETONESUR NEGATIVE 07/04/2020 1033   PROTEINUR NEGATIVE 07/04/2020 1033   UROBILINOGEN 0.2 02/20/2015 1028   NITRITE NEGATIVE  07/04/2020 1033   LEUKOCYTESUR NEGATIVE 07/04/2020 1033   Sepsis Labs: @LABRCNTIP (procalcitonin:4,lacticidven:4)  )No results found for this or any previous visit (from the past 240 hour(s)).    Studies: No results found.  Scheduled Meds: . acidophilus  2 capsule Oral Daily  . buPROPion  450 mg Oral Daily  . dicyclomine  10 mg Oral TID AC & HS  . doxycycline  100 mg Oral Q12H  . enoxaparin (LOVENOX) injection  40 mg Subcutaneous Q24H  . losartan  100 mg Oral Daily   And  . hydrochlorothiazide  12.5 mg Oral Daily  . leflunomide  20 mg Oral QHS  . levothyroxine  100 mcg Oral Q Sat  . levothyroxine  200 mcg Oral Once per day on Mon Tue Wed Thu Fri  . pantoprazole  40 mg Oral BID  . pramipexole  0.125 mg Oral TID  . senna-docusate  1 tablet Oral BID  . silver sulfADIAZINE   Topical BID  . sodium chloride flush  3 mL Intravenous Q12H    Continuous Infusions: . sodium chloride    . sodium chloride 75 mL/hr at 07/13/20 0605  . lactated ringers 50 mL/hr at 06/30/20 1457  . methocarbamol (ROBAXIN)  IV       LOS: 15 days     Darlin Drop, MD [facility] Pager [phone]  If 7PM-7AM, please contact night-coverage [email or web address] Password [facility] 07/13/2020, 12:21 PM

## 2020-07-13 NOTE — Discharge Summary (Signed)
Physician Discharge Summary  Patient ID: [name] MRN: [id number] DOB/AGE: Oct 02, 1955 65 y.o.  Admit date: 06/28/2020 Discharge date: 07/13/2020  Admission Diagnoses: Degenerative scoliosis, lumbar spinal stenosis, neurogenic claudication history of fusion L3 to sacrum with history of lumbar wound infection  Discharge Diagnoses: Degenerative scoliosis, lumbar spinal stenosis, neurogenic claudication.  History of fusion L3 to sacrum with history of lumbar wound infection post surgery Active Problems:   Status post lumbar spinal fusion   Thoracic spondylosis with myelopathy   Right lower quadrant abdominal pain   Discharged Condition: good  Hospital Course: Patient was admitted to undergo surgical decompression and stabilization across the thoracolumbar junction having had a previous fusion L3 to sacrum.  Patient had degenerative scoliosis.  She did have evidence of a spinal fluid leak at the time of surgery which was oversewn primarily.  As the patient was recovering she had some drainage from the wound that developed nearly a week after her surgical intervention.  She was then treated at bedrest for several days and careful wound care was applied and the patient's wound has sealed and healed over.  She is not having any headache and is progressing with her activity at this time.  She will require home health physical therapy.  She is on doxycycline as an oral antibiotic.  Consults: Hospitalist  Significant Diagnostic Studies: None  Treatments: IV antibiotics surgery decompression stabilization from T10-L3 with revision of hardware from L3-S1.  Physical therapy occupational therapy.  Hospitalist service for medical management  Discharge Exam: Blood pressure 137/68, pulse 81, temperature 98.7 F (37.1 C), temperature source Oral, resp. rate 18, height 5\' 2"  (1.575 m), weight 87.5 kg, SpO2 100 %. Incision remains clean and dry motor function is good in the lower extremities tenderness  noted in the midportion of the back overlying the incision but erythema and swelling in the incision itself is diminished.  Disposition: Discharge disposition: 01-Home or Self Care       Discharge Instructions    Call MD for:  redness, tenderness, or signs of infection (pain, swelling, redness, odor or green/yellow discharge around incision site)   Complete by: As directed    Call MD for:  severe uncontrolled pain   Complete by: As directed    Call MD for:  temperature >100.4   Complete by: As directed    Diet - low sodium heart healthy   Complete by: As directed    Diet general   Complete by: As directed    Discharge wound care:   Complete by: As directed    Okay to shower, paint incision with Betadine after shower and place dry gauze for the next 3 days.  If wound remains dry and clean can discontinue dressing change.   Incentive spirometry RT   Complete by: As directed    Increase activity slowly   Complete by: As directed      Allergies as of 07/13/2020      Reactions   Prednisone Other (See Comments)   Chest pain   Clindamycin/lincomycin Rash   Influenza Vaccines Hives   Lodine [etodolac] Rash      Medication List    STOP taking these medications   meloxicam 15 MG tablet Commonly known as: MOBIC     TAKE these medications   acetaminophen 650 MG CR tablet Commonly known as: TYLENOL Take 1,300 mg by mouth every 8 (eight) hours as needed for pain.   ALPRAZolam 0.25 MG tablet Commonly known as: XANAX Take 0.25 mg by  mouth 2 (two) times daily as needed for anxiety or sleep.   buPROPion 150 MG 24 hr tablet Commonly known as: WELLBUTRIN XL Take 450 mg by mouth daily.   dicyclomine 10 MG capsule Commonly known as: BENTYL Take 1 capsule (10 mg total) by mouth 4 (four) times daily -  before meals and at bedtime.   doxycycline 100 MG tablet Commonly known as: VIBRA-TABS Take 1 tablet (100 mg total) by mouth every 12 (twelve) hours.   esomeprazole 20 MG  capsule Commonly known as: NEXIUM Take 20 mg by mouth daily.   hydroxychloroquine 200 MG tablet Commonly known as: PLAQUENIL Take 200 mg by mouth 2 (two) times daily.   hydrOXYzine 25 MG tablet Commonly known as: ATARAX/VISTARIL Take 1 tablet (25 mg total) by mouth every 6 (six) hours as needed for nausea or vomiting.   leflunomide 20 MG tablet Commonly known as: ARAVA Take 20 mg by mouth at bedtime.   levothyroxine 200 MCG tablet Commonly known as: SYNTHROID Take 200 mcg by mouth See admin instructions. Take 200 mcg daily except on Saturday take 100 mcg. Skip dose on Sundays.   losartan-hydrochlorothiazide 100-12.5 MG tablet Commonly known as: HYZAAR Take 1 tablet by mouth every morning.   oxyCODONE-acetaminophen 5-325 MG tablet Commonly known as: PERCOCET/ROXICET Take 1-2 tablets by mouth every 4 (four) hours as needed for moderate pain or severe pain.   pramipexole 0.125 MG tablet Commonly known as: MIRAPEX Take 0.125 mg by mouth 3 (three) times daily.   PROBIOTIC PO Take 1 tablet by mouth every morning.   Vitamin D (Ergocalciferol) 1.25 MG (50000 UNIT) Caps capsule Commonly known as: DRISDOL Take 50,000 Units by mouth every Sunday.            Durable Medical Equipment  (From admission, onward)         Start     Ordered   07/12/20 0700  For home use only DME Walker rolling  Once       Question Answer Comment  Walker: With 5 Inch Wheels   Patient needs a walker to treat with the following condition Ambulatory dysfunction      07/12/20 0544   07/12/20 0700  For home use only DME 3 n 1  Once        01 /20/22 0544           Discharge Care Instructions  (From admission, onward)         Start     Ordered   07/13/20 0000  Discharge wound care:       Comments: Okay to shower, paint incision with Betadine after shower and place dry gauze for the next 3 days.  If wound remains dry and clean can discontinue dressing change.   07/13/20 0829           Follow-up Information    [facility] Follow up.   Specialty: Home Health Services Why: Your home health has been set up with [facility]. The agency will call you with start of service dates. For any questions please call the number above.  Contact information: [address] [place] [phone]               Signed: [phone] 07/13/2020, 8:30 AM

## 2020-07-13 NOTE — Progress Notes (Signed)
Physical Therapy Treatment Patient Details Name: [name] MRN: [id number] DOB: 03-24-1956 Today's Date: 07/13/2020    History of Present Illness The pt is a 65 yo female presenting s/p anterolateal decompression and fusion of T12-L1 and L1-L2 on 1/06. Pt developed post operative L ptx and is now s/p chest tube insertion. PMH includes: anxiety, arthritis, HTN, vertigo, fibromyalgia, previous lumbar fusion (L3-sacrum), and bilateral TKA.    PT Comments    Patient received in bed, pleasant and cooperative. Nursing reports they took orthostatics earlier and she did much better, was negative. Able to progress gait distance significantly with RW, also able to introduce stairs today with min guard and step to pattern with use of R railing. Moving well at a supervision to min guard level but fatigued easily. Discussed and demonstrated car transfers as well with good understanding from patient and husband. Left in bed with all needs met, spouse present.    Follow Up Recommendations  Home health PT;Supervision/Assistance - 24 hour     Equipment Recommendations  Rolling walker with 5" wheels;3in1 (PT)    Recommendations for Other Services       Precautions / Restrictions Precautions Precautions: Fall;Back Precaution Comments: watch out for orthostatic hypotension Required Braces or Orthoses: Spinal Brace Spinal Brace: Lumbar corset;Applied in sitting position Restrictions Weight Bearing Restrictions: No    Mobility  Bed Mobility Overal bed mobility: Needs Assistance   Rolling: Supervision Sidelying to sit: Supervision     Sit to sidelying: Supervision General bed mobility comments: S for safety, demonstrated good carryover of technique and precautions with bed mobility  Transfers Overall transfer level: Needs assistance Equipment used: Rolling walker (2 wheeled) Transfers: Sit to/from Stand Sit to Stand: Supervision         General transfer comment: S for safefty, no  physical assist given, good carryover of hand placement and technique  Ambulation/Gait Ambulation/Gait assistance: Min guard Gait Distance (Feet): 100 Feet Assistive device: Rolling walker (2 wheeled) Gait Pattern/deviations: Step-through pattern;Decreased step length - right;Decreased step length - left;Decreased stride length Gait velocity: decreased   General Gait Details: slow but steady with OR, no orthostatic hypotension today and tolerated activity well, just easily fatigued. Cues to avoid twisting with dynamic activities.   Stairs Stairs: Yes Stairs assistance: Min guard Stair Management: One rail Right;Step to pattern;Forwards Number of Stairs: 3 General stair comments: use of R rail and min guard for safety, cues for sequencing and technique. Fatigued but steady and safe   Wheelchair Mobility    Modified Rankin (Stroke Patients Only)       Balance Overall balance assessment: Needs assistance Sitting-balance support: Feet supported Sitting balance-Leahy Scale: Good Sitting balance - Comments: pt able to don brace in unsupportd sitting position without LOB   Standing balance support: Bilateral upper extremity supported Standing balance-Leahy Scale: Fair Standing balance comment: Able to maintain standing balance without UE support during grooming standing at sink.                            Cognition Arousal/Alertness: Awake/alert Behavior During Therapy: WFL for tasks assessed/performed Overall Cognitive Status: Within Functional Limits for tasks assessed                                 General Comments: calm today, excited about possibility of going home today      Exercises  General Comments        Pertinent Vitals/Pain Pain Assessment: 0-10 Pain Score: 5  Pain Location: back with mobility Pain Descriptors / Indicators: Discomfort;Sore Pain Intervention(s): Limited activity within patient's tolerance;Monitored during  session    Home Living                      Prior Function            PT Goals (current goals can now be found in the care plan section) Acute Rehab PT Goals Patient Stated Goal: Decrease pain PT Goal Formulation: With patient Time For Goal Achievement: 07/13/20 Potential to Achieve Goals: Good Progress towards PT goals: Progressing toward goals    Frequency    Min 5X/week      PT Plan Current plan remains appropriate    Co-evaluation              AM-PAC PT "6 Clicks" Mobility   Outcome Measure  Help needed turning from your back to your side while in a flat bed without using bedrails?: None Help needed moving from lying on your back to sitting on the side of a flat bed without using bedrails?: None Help needed moving to and from a bed to a chair (including a wheelchair)?: A Little Help needed standing up from a chair using your arms (e.g., wheelchair or bedside chair)?: A Little Help needed to walk in hospital room?: A Little Help needed climbing 3-5 steps with a railing? : A Little 6 Click Score: 20    End of Session Equipment Utilized During Treatment: Back brace Activity Tolerance: Patient tolerated treatment well Patient left: in bed;with call bell/phone within reach;with family/visitor present   PT Visit Diagnosis: Other abnormalities of gait and mobility (R26.89);Pain Pain - part of body:  (back and headache)     Time: 7342-8768 PT Time Calculation (min) (ACUTE ONLY): 25 min  Charges:  $Gait Training: 8-22 mins $Therapeutic Activity: 8-22 mins                     Windell Norfolk, DPT, PN1   Supplemental Physical Therapist [place]    Pager [phone] Acute Rehab Office [phone]

## 2020-07-17 DIAGNOSIS — M19072 Primary osteoarthritis, left ankle and foot: Secondary | ICD-10-CM | POA: Diagnosis not present

## 2020-07-17 DIAGNOSIS — Z8616 Personal history of COVID-19: Secondary | ICD-10-CM | POA: Diagnosis not present

## 2020-07-17 DIAGNOSIS — M19042 Primary osteoarthritis, left hand: Secondary | ICD-10-CM | POA: Diagnosis not present

## 2020-07-17 DIAGNOSIS — M47815 Spondylosis without myelopathy or radiculopathy, thoracolumbar region: Secondary | ICD-10-CM | POA: Diagnosis not present

## 2020-07-17 DIAGNOSIS — M4186 Other forms of scoliosis, lumbar region: Secondary | ICD-10-CM | POA: Diagnosis not present

## 2020-07-17 DIAGNOSIS — K5900 Constipation, unspecified: Secondary | ICD-10-CM | POA: Diagnosis not present

## 2020-07-17 DIAGNOSIS — Z8701 Personal history of pneumonia (recurrent): Secondary | ICD-10-CM | POA: Diagnosis not present

## 2020-07-17 DIAGNOSIS — M4185 Other forms of scoliosis, thoracolumbar region: Secondary | ICD-10-CM | POA: Diagnosis not present

## 2020-07-17 DIAGNOSIS — Z6835 Body mass index (BMI) 35.0-35.9, adult: Secondary | ICD-10-CM | POA: Diagnosis not present

## 2020-07-17 DIAGNOSIS — M48062 Spinal stenosis, lumbar region with neurogenic claudication: Secondary | ICD-10-CM | POA: Diagnosis not present

## 2020-07-17 DIAGNOSIS — M19041 Primary osteoarthritis, right hand: Secondary | ICD-10-CM | POA: Diagnosis not present

## 2020-07-17 DIAGNOSIS — I10 Essential (primary) hypertension: Secondary | ICD-10-CM | POA: Diagnosis not present

## 2020-07-17 DIAGNOSIS — M4714 Other spondylosis with myelopathy, thoracic region: Secondary | ICD-10-CM | POA: Diagnosis not present

## 2020-07-17 DIAGNOSIS — K219 Gastro-esophageal reflux disease without esophagitis: Secondary | ICD-10-CM | POA: Diagnosis not present

## 2020-07-17 DIAGNOSIS — M4805 Spinal stenosis, thoracolumbar region: Secondary | ICD-10-CM | POA: Diagnosis not present

## 2020-07-17 DIAGNOSIS — M47816 Spondylosis without myelopathy or radiculopathy, lumbar region: Secondary | ICD-10-CM | POA: Diagnosis not present

## 2020-07-17 DIAGNOSIS — M19071 Primary osteoarthritis, right ankle and foot: Secondary | ICD-10-CM | POA: Diagnosis not present

## 2020-07-17 DIAGNOSIS — E039 Hypothyroidism, unspecified: Secondary | ICD-10-CM | POA: Diagnosis not present

## 2020-07-17 DIAGNOSIS — E669 Obesity, unspecified: Secondary | ICD-10-CM | POA: Diagnosis not present

## 2020-07-17 DIAGNOSIS — F419 Anxiety disorder, unspecified: Secondary | ICD-10-CM | POA: Diagnosis not present

## 2020-07-17 DIAGNOSIS — Z4789 Encounter for other orthopedic aftercare: Secondary | ICD-10-CM | POA: Diagnosis not present

## 2020-07-17 DIAGNOSIS — Z792 Long term (current) use of antibiotics: Secondary | ICD-10-CM | POA: Diagnosis not present

## 2020-07-17 DIAGNOSIS — G2581 Restless legs syndrome: Secondary | ICD-10-CM | POA: Diagnosis not present

## 2020-07-17 DIAGNOSIS — M797 Fibromyalgia: Secondary | ICD-10-CM | POA: Diagnosis not present

## 2020-07-17 DIAGNOSIS — Z96653 Presence of artificial knee joint, bilateral: Secondary | ICD-10-CM | POA: Diagnosis not present

## 2020-07-24 DIAGNOSIS — M19072 Primary osteoarthritis, left ankle and foot: Secondary | ICD-10-CM | POA: Diagnosis not present

## 2020-07-24 DIAGNOSIS — K5900 Constipation, unspecified: Secondary | ICD-10-CM | POA: Diagnosis not present

## 2020-07-24 DIAGNOSIS — M19041 Primary osteoarthritis, right hand: Secondary | ICD-10-CM | POA: Diagnosis not present

## 2020-07-24 DIAGNOSIS — G2581 Restless legs syndrome: Secondary | ICD-10-CM | POA: Diagnosis not present

## 2020-07-24 DIAGNOSIS — F419 Anxiety disorder, unspecified: Secondary | ICD-10-CM | POA: Diagnosis not present

## 2020-07-24 DIAGNOSIS — E039 Hypothyroidism, unspecified: Secondary | ICD-10-CM | POA: Diagnosis not present

## 2020-07-24 DIAGNOSIS — M48062 Spinal stenosis, lumbar region with neurogenic claudication: Secondary | ICD-10-CM | POA: Diagnosis not present

## 2020-07-24 DIAGNOSIS — M47816 Spondylosis without myelopathy or radiculopathy, lumbar region: Secondary | ICD-10-CM | POA: Diagnosis not present

## 2020-07-24 DIAGNOSIS — Z8616 Personal history of COVID-19: Secondary | ICD-10-CM | POA: Diagnosis not present

## 2020-07-24 DIAGNOSIS — I10 Essential (primary) hypertension: Secondary | ICD-10-CM | POA: Diagnosis not present

## 2020-07-24 DIAGNOSIS — M4714 Other spondylosis with myelopathy, thoracic region: Secondary | ICD-10-CM | POA: Diagnosis not present

## 2020-07-24 DIAGNOSIS — E669 Obesity, unspecified: Secondary | ICD-10-CM | POA: Diagnosis not present

## 2020-07-24 DIAGNOSIS — M4185 Other forms of scoliosis, thoracolumbar region: Secondary | ICD-10-CM | POA: Diagnosis not present

## 2020-07-24 DIAGNOSIS — M797 Fibromyalgia: Secondary | ICD-10-CM | POA: Diagnosis not present

## 2020-07-24 DIAGNOSIS — M19071 Primary osteoarthritis, right ankle and foot: Secondary | ICD-10-CM | POA: Diagnosis not present

## 2020-07-24 DIAGNOSIS — M19042 Primary osteoarthritis, left hand: Secondary | ICD-10-CM | POA: Diagnosis not present

## 2020-07-24 DIAGNOSIS — Z96653 Presence of artificial knee joint, bilateral: Secondary | ICD-10-CM | POA: Diagnosis not present

## 2020-07-24 DIAGNOSIS — M4805 Spinal stenosis, thoracolumbar region: Secondary | ICD-10-CM | POA: Diagnosis not present

## 2020-07-24 DIAGNOSIS — Z4789 Encounter for other orthopedic aftercare: Secondary | ICD-10-CM | POA: Diagnosis not present

## 2020-07-24 DIAGNOSIS — Z6835 Body mass index (BMI) 35.0-35.9, adult: Secondary | ICD-10-CM | POA: Diagnosis not present

## 2020-07-24 DIAGNOSIS — M4186 Other forms of scoliosis, lumbar region: Secondary | ICD-10-CM | POA: Diagnosis not present

## 2020-07-24 DIAGNOSIS — Z792 Long term (current) use of antibiotics: Secondary | ICD-10-CM | POA: Diagnosis not present

## 2020-07-24 DIAGNOSIS — K219 Gastro-esophageal reflux disease without esophagitis: Secondary | ICD-10-CM | POA: Diagnosis not present

## 2020-07-24 DIAGNOSIS — M47815 Spondylosis without myelopathy or radiculopathy, thoracolumbar region: Secondary | ICD-10-CM | POA: Diagnosis not present

## 2020-07-24 DIAGNOSIS — Z8701 Personal history of pneumonia (recurrent): Secondary | ICD-10-CM | POA: Diagnosis not present

## 2020-08-01 DIAGNOSIS — M48062 Spinal stenosis, lumbar region with neurogenic claudication: Secondary | ICD-10-CM | POA: Diagnosis not present

## 2020-08-09 DIAGNOSIS — M1120 Other chondrocalcinosis, unspecified site: Secondary | ICD-10-CM | POA: Diagnosis not present

## 2020-08-09 DIAGNOSIS — E669 Obesity, unspecified: Secondary | ICD-10-CM | POA: Diagnosis not present

## 2020-08-09 DIAGNOSIS — Z79899 Other long term (current) drug therapy: Secondary | ICD-10-CM | POA: Diagnosis not present

## 2020-08-09 DIAGNOSIS — Z6832 Body mass index (BMI) 32.0-32.9, adult: Secondary | ICD-10-CM | POA: Diagnosis not present

## 2020-08-09 DIAGNOSIS — M0609 Rheumatoid arthritis without rheumatoid factor, multiple sites: Secondary | ICD-10-CM | POA: Diagnosis not present

## 2020-08-09 DIAGNOSIS — M150 Primary generalized (osteo)arthritis: Secondary | ICD-10-CM | POA: Diagnosis not present

## 2020-08-09 DIAGNOSIS — M797 Fibromyalgia: Secondary | ICD-10-CM | POA: Diagnosis not present

## 2020-08-10 DIAGNOSIS — Z96653 Presence of artificial knee joint, bilateral: Secondary | ICD-10-CM | POA: Diagnosis not present

## 2020-08-10 DIAGNOSIS — K5900 Constipation, unspecified: Secondary | ICD-10-CM | POA: Diagnosis not present

## 2020-08-10 DIAGNOSIS — M4805 Spinal stenosis, thoracolumbar region: Secondary | ICD-10-CM | POA: Diagnosis not present

## 2020-08-10 DIAGNOSIS — E669 Obesity, unspecified: Secondary | ICD-10-CM | POA: Diagnosis not present

## 2020-08-10 DIAGNOSIS — Z8701 Personal history of pneumonia (recurrent): Secondary | ICD-10-CM | POA: Diagnosis not present

## 2020-08-10 DIAGNOSIS — K219 Gastro-esophageal reflux disease without esophagitis: Secondary | ICD-10-CM | POA: Diagnosis not present

## 2020-08-10 DIAGNOSIS — Z4789 Encounter for other orthopedic aftercare: Secondary | ICD-10-CM | POA: Diagnosis not present

## 2020-08-10 DIAGNOSIS — M47816 Spondylosis without myelopathy or radiculopathy, lumbar region: Secondary | ICD-10-CM | POA: Diagnosis not present

## 2020-08-10 DIAGNOSIS — E039 Hypothyroidism, unspecified: Secondary | ICD-10-CM | POA: Diagnosis not present

## 2020-08-10 DIAGNOSIS — M4185 Other forms of scoliosis, thoracolumbar region: Secondary | ICD-10-CM | POA: Diagnosis not present

## 2020-08-10 DIAGNOSIS — M19072 Primary osteoarthritis, left ankle and foot: Secondary | ICD-10-CM | POA: Diagnosis not present

## 2020-08-10 DIAGNOSIS — G2581 Restless legs syndrome: Secondary | ICD-10-CM | POA: Diagnosis not present

## 2020-08-10 DIAGNOSIS — M797 Fibromyalgia: Secondary | ICD-10-CM | POA: Diagnosis not present

## 2020-08-10 DIAGNOSIS — M47815 Spondylosis without myelopathy or radiculopathy, thoracolumbar region: Secondary | ICD-10-CM | POA: Diagnosis not present

## 2020-08-10 DIAGNOSIS — Z6835 Body mass index (BMI) 35.0-35.9, adult: Secondary | ICD-10-CM | POA: Diagnosis not present

## 2020-08-10 DIAGNOSIS — Z8616 Personal history of COVID-19: Secondary | ICD-10-CM | POA: Diagnosis not present

## 2020-08-10 DIAGNOSIS — M48062 Spinal stenosis, lumbar region with neurogenic claudication: Secondary | ICD-10-CM | POA: Diagnosis not present

## 2020-08-10 DIAGNOSIS — M19041 Primary osteoarthritis, right hand: Secondary | ICD-10-CM | POA: Diagnosis not present

## 2020-08-10 DIAGNOSIS — F419 Anxiety disorder, unspecified: Secondary | ICD-10-CM | POA: Diagnosis not present

## 2020-08-10 DIAGNOSIS — M4186 Other forms of scoliosis, lumbar region: Secondary | ICD-10-CM | POA: Diagnosis not present

## 2020-08-10 DIAGNOSIS — M4714 Other spondylosis with myelopathy, thoracic region: Secondary | ICD-10-CM | POA: Diagnosis not present

## 2020-08-10 DIAGNOSIS — M19071 Primary osteoarthritis, right ankle and foot: Secondary | ICD-10-CM | POA: Diagnosis not present

## 2020-08-10 DIAGNOSIS — M19042 Primary osteoarthritis, left hand: Secondary | ICD-10-CM | POA: Diagnosis not present

## 2020-08-10 DIAGNOSIS — I10 Essential (primary) hypertension: Secondary | ICD-10-CM | POA: Diagnosis not present

## 2020-08-10 DIAGNOSIS — Z792 Long term (current) use of antibiotics: Secondary | ICD-10-CM | POA: Diagnosis not present

## 2020-08-16 DIAGNOSIS — M19041 Primary osteoarthritis, right hand: Secondary | ICD-10-CM | POA: Diagnosis not present

## 2020-08-16 DIAGNOSIS — M4185 Other forms of scoliosis, thoracolumbar region: Secondary | ICD-10-CM | POA: Diagnosis not present

## 2020-08-16 DIAGNOSIS — M19071 Primary osteoarthritis, right ankle and foot: Secondary | ICD-10-CM | POA: Diagnosis not present

## 2020-08-16 DIAGNOSIS — K5900 Constipation, unspecified: Secondary | ICD-10-CM | POA: Diagnosis not present

## 2020-08-16 DIAGNOSIS — K219 Gastro-esophageal reflux disease without esophagitis: Secondary | ICD-10-CM | POA: Diagnosis not present

## 2020-08-16 DIAGNOSIS — I10 Essential (primary) hypertension: Secondary | ICD-10-CM | POA: Diagnosis not present

## 2020-08-16 DIAGNOSIS — F419 Anxiety disorder, unspecified: Secondary | ICD-10-CM | POA: Diagnosis not present

## 2020-08-16 DIAGNOSIS — M4805 Spinal stenosis, thoracolumbar region: Secondary | ICD-10-CM | POA: Diagnosis not present

## 2020-08-16 DIAGNOSIS — M797 Fibromyalgia: Secondary | ICD-10-CM | POA: Diagnosis not present

## 2020-08-16 DIAGNOSIS — M19072 Primary osteoarthritis, left ankle and foot: Secondary | ICD-10-CM | POA: Diagnosis not present

## 2020-08-16 DIAGNOSIS — M47815 Spondylosis without myelopathy or radiculopathy, thoracolumbar region: Secondary | ICD-10-CM | POA: Diagnosis not present

## 2020-08-16 DIAGNOSIS — M4714 Other spondylosis with myelopathy, thoracic region: Secondary | ICD-10-CM | POA: Diagnosis not present

## 2020-08-16 DIAGNOSIS — Z96653 Presence of artificial knee joint, bilateral: Secondary | ICD-10-CM | POA: Diagnosis not present

## 2020-08-16 DIAGNOSIS — Z8701 Personal history of pneumonia (recurrent): Secondary | ICD-10-CM | POA: Diagnosis not present

## 2020-08-16 DIAGNOSIS — Z8616 Personal history of COVID-19: Secondary | ICD-10-CM | POA: Diagnosis not present

## 2020-08-16 DIAGNOSIS — Z4789 Encounter for other orthopedic aftercare: Secondary | ICD-10-CM | POA: Diagnosis not present

## 2020-08-16 DIAGNOSIS — M19042 Primary osteoarthritis, left hand: Secondary | ICD-10-CM | POA: Diagnosis not present

## 2020-08-16 DIAGNOSIS — Z792 Long term (current) use of antibiotics: Secondary | ICD-10-CM | POA: Diagnosis not present

## 2020-08-16 DIAGNOSIS — E039 Hypothyroidism, unspecified: Secondary | ICD-10-CM | POA: Diagnosis not present

## 2020-08-16 DIAGNOSIS — M4186 Other forms of scoliosis, lumbar region: Secondary | ICD-10-CM | POA: Diagnosis not present

## 2020-08-16 DIAGNOSIS — M47816 Spondylosis without myelopathy or radiculopathy, lumbar region: Secondary | ICD-10-CM | POA: Diagnosis not present

## 2020-08-16 DIAGNOSIS — Z6835 Body mass index (BMI) 35.0-35.9, adult: Secondary | ICD-10-CM | POA: Diagnosis not present

## 2020-08-16 DIAGNOSIS — M48062 Spinal stenosis, lumbar region with neurogenic claudication: Secondary | ICD-10-CM | POA: Diagnosis not present

## 2020-08-16 DIAGNOSIS — G2581 Restless legs syndrome: Secondary | ICD-10-CM | POA: Diagnosis not present

## 2020-08-16 DIAGNOSIS — E669 Obesity, unspecified: Secondary | ICD-10-CM | POA: Diagnosis not present

## 2020-08-29 ENCOUNTER — Other Ambulatory Visit (HOSPITAL_COMMUNITY): Payer: Self-pay | Admitting: Neurological Surgery

## 2020-08-29 ENCOUNTER — Ambulatory Visit (HOSPITAL_COMMUNITY)
Admission: RE | Admit: 2020-08-29 | Discharge: 2020-08-29 | Disposition: A | Discharge status: Home / Self Care | Payer: PPO | Source: Ambulatory Visit | Attending: Neurological Surgery | Admitting: Neurological Surgery

## 2020-08-29 ENCOUNTER — Other Ambulatory Visit: Payer: Self-pay

## 2020-08-29 DIAGNOSIS — R600 Localized edema: Secondary | ICD-10-CM | POA: Diagnosis not present

## 2020-08-29 DIAGNOSIS — M40204 Unspecified kyphosis, thoracic region: Secondary | ICD-10-CM | POA: Diagnosis not present

## 2020-08-29 NOTE — Progress Notes (Signed)
Bilateral lower extremity venous duplex has been completed. Preliminary results can be found in CV Proc through chart review.  Results were given to [facility] at Dr. Verlee Rossetti office.  08/29/20 2:37 PM Olen Cordial RVT

## 2020-09-11 ENCOUNTER — Other Ambulatory Visit: Payer: Self-pay

## 2020-09-11 ENCOUNTER — Ambulatory Visit: Payer: PPO | Admitting: Podiatry

## 2020-09-11 ENCOUNTER — Ambulatory Visit (INDEPENDENT_AMBULATORY_CARE_PROVIDER_SITE_OTHER): Payer: PPO

## 2020-09-11 DIAGNOSIS — R609 Edema, unspecified: Secondary | ICD-10-CM | POA: Diagnosis not present

## 2020-09-11 DIAGNOSIS — M25572 Pain in left ankle and joints of left foot: Secondary | ICD-10-CM

## 2020-09-11 DIAGNOSIS — M4155 Other secondary scoliosis, thoracolumbar region: Secondary | ICD-10-CM | POA: Diagnosis not present

## 2020-09-11 DIAGNOSIS — M545 Low back pain, unspecified: Secondary | ICD-10-CM | POA: Diagnosis not present

## 2020-09-11 DIAGNOSIS — M1990 Unspecified osteoarthritis, unspecified site: Secondary | ICD-10-CM

## 2020-09-11 DIAGNOSIS — M722 Plantar fascial fibromatosis: Secondary | ICD-10-CM | POA: Diagnosis not present

## 2020-09-12 DIAGNOSIS — M545 Low back pain, unspecified: Secondary | ICD-10-CM | POA: Diagnosis not present

## 2020-09-12 DIAGNOSIS — M4155 Other secondary scoliosis, thoracolumbar region: Secondary | ICD-10-CM | POA: Diagnosis not present

## 2020-09-15 NOTE — Progress Notes (Signed)
Subjective:   Patient ID: [name], female   DOB: 65 y.o.   MRN: [id number]   HPI 64 year old female presents the office with concerns of swelling to left ankle to the ankle for last 1 month.  She has had a history of bilateral foot reconstructions about 10 years ago.  She recently had back surgery in January and she states that since that is when the swelling started.  She had ultrasounds that were negative for DVT.  She thyromegaly over time but has not yet.  No recent injury or falls or changes otherwise.  She has no other concerns.   Review of Systems  All other systems reviewed and are negative.  Past Medical History:  Diagnosis Date  . Anxiety   . Arthritis   . Complication of anesthesia    during surgery she could hear staff talking and could feel them preping her arm over 30 yrs. ago  . Fibromyalgia   . GERD (gastroesophageal reflux disease)   . Hypertension   . Hypothyroidism   . Restless leg syndrome   . Staph infection    2015  . Wears glasses     Past Surgical History:  Procedure Laterality Date  . ABDOMINAL HYSTERECTOMY  1981   partial  . ANTERIOR LAT LUMBAR FUSION N/A 06/28/2020   Procedure: Thoracic Twelve to Lumbar One, Lumbar One-Two; Anterior lateral fusion, pedicle screws Thoracic Ten to Lumbar Three, Laminectomy Thoracic Twelve--Lumbar Two;  Surgeon: Barnett Abu, MD;  Location: [facility] OR;  Service: Neurosurgery;  Laterality: N/A;  . APPLICATION OF ROBOTIC ASSISTANCE FOR SPINAL PROCEDURE N/A 06/28/2020   Procedure: APPLICATION OF ROBOTIC ASSISTANCE FOR SPINAL PROCEDURE;  Surgeon: Barnett Abu, MD;  Location: [facility] OR;  Service: Neurosurgery;  Laterality: N/A;  . APPLICATION OF WOUND VAC N/A 04/25/2014   Procedure: APPLICATION OF WOUND VAC;  Surgeon: [name], MD;  Location: [facility] NEURO ORS;  Service: Neurosurgery;  Laterality: N/A;  . BACK SURGERY  2007  . BLADDER SUSPENSION     2004  . CERVICAL FUSION  1990  . CERVICAL FUSION     2000  . CYST EXCISION  Left 1985   wrist  . CYST EXCISION  1985   chest  . FOOT SURGERY Right 2009   reconstruction with hardware  . FOOT SURGERY Left 2013   reconstruction with hardware  . LUMBAR FUSION  03/2014  . LUMBAR PERCUTANEOUS PEDICLE SCREW 4 LEVEL N/A 06/28/2020   Procedure: LUMBAR PERCUTANEOUS PEDICLE SCREW Thoracic Ten to Lumbar Three, Laminectomy Thoracic Twelve--Lumbar Two;  Surgeon: Barnett Abu, MD;  Location: [facility] OR;  Service: Neurosurgery;  Laterality: N/A;  posterior  . LUMBAR WOUND DEBRIDEMENT N/A 04/25/2014   Procedure: LUMBAR WOUND DEBRIDEMENT Placement of Woundvac);  Surgeon: [name], MD;  Location: [facility] NEUR[facility];  Service: Neurosurgery;  Laterality: N/A;  . PICC LINE PLACE PERIPHERAL ([facility] HX)     2015-infusion therapy secondary to STAPH infection   . TONSILLECTOMY    . TOTAL KNEE ARTHROPLASTY Bilateral 02/27/2015   Procedure: RIGHT TOTAL KNEE ARTHROPLASTY, LEFT KNEE CORTISONE INJECTION;  Surgeon: Durene Romans, MD;  Location: [facility] ORS;  Service: Orthopedics;  Laterality: Bilateral;  . TOTAL KNEE ARTHROPLASTY Left 06/19/2015   Procedure: LEFT TOTAL KNEE ARTHROPLASTY;  Surgeon: Durene Romans, MD;  Location: [facility] ORS;  Service: Orthopedics;  Laterality: Left;  . TUBAL LIGATION       Current Outpatient Medications:  .  calcium carbonate (SUPER CALCIUM) 1500 (600 Ca) MG TABS tablet, 1 tablet with  meals, Disp: , Rfl:  .  Cholecalciferol (VITAMIN D) 50 MCG (2000 UT) CAPS, 1 capsule, Disp: , Rfl:  .  acetaminophen (TYLENOL) 650 MG CR tablet, Take 1,300 mg by mouth every 8 (eight) hours as needed for pain., Disp: , Rfl:  .  ALPRAZolam (XANAX) 0.25 MG tablet, Take 0.25 mg by mouth 2 (two) times daily as needed for anxiety or sleep., Disp: , Rfl:  .  buPROPion (WELLBUTRIN XL) 150 MG 24 hr tablet, Take 450 mg by mouth daily., Disp: , Rfl:  .  colchicine (COLCRYS) 0.6 MG tablet, Take by mouth., Disp: , Rfl:  .  dicyclomine (BENTYL) 10 MG capsule, Take 1 capsule (10 mg total) by mouth 4 (four) times  daily -  before meals and at bedtime., Disp: 40 capsule, Rfl: 3 .  doxycycline (VIBRA-TABS) 100 MG tablet, Take 1 tablet (100 mg total) by mouth every 12 (twelve) hours., Disp: 20 tablet, Rfl: 0 .  esomeprazole (NEXIUM) 20 MG capsule, Take 20 mg by mouth daily. , Disp: , Rfl:  .  hydroxychloroquine (PLAQUENIL) 200 MG tablet, Take 200 mg by mouth 2 (two) times daily., Disp: , Rfl:  .  hydrOXYzine (ATARAX/VISTARIL) 25 MG tablet, Take 1 tablet (25 mg total) by mouth every 6 (six) hours as needed for nausea or vomiting., Disp: 30 tablet, Rfl: 3 .  leflunomide (ARAVA) 20 MG tablet, Take 20 mg by mouth at bedtime., Disp: , Rfl:  .  levothyroxine (SYNTHROID) 200 MCG tablet, Take 200 mcg by mouth See admin instructions. Take 200 mcg daily except on Saturday take 100 mcg. Skip dose on Sundays., Disp: , Rfl:  .  losartan-hydrochlorothiazide (HYZAAR) 100-12.5 MG per tablet, Take 1 tablet by mouth every morning. , Disp: , Rfl:  .  meclizine (ANTIVERT) 25 MG tablet, TAKE ONE TO TWO TABLETS BY MOUTH 4 TIMES DAILY AS NEEDED, Disp: , Rfl:  .  meloxicam (MOBIC) 15 MG tablet, 1 tablet, Disp: , Rfl:  .  oxyCODONE-acetaminophen (PERCOCET/ROXICET) 5-325 MG tablet, Take 1-2 tablets by mouth every 4 (four) hours as needed for moderate pain or severe pain., Disp: 60 tablet, Rfl: 0 .  pramipexole (MIRAPEX) 0.125 MG tablet, Take 0.125 mg by mouth 3 (three) times daily., Disp: , Rfl:  .  Probiotic Product (PROBIOTIC PO), Take 1 tablet by mouth every morning., Disp: , Rfl:  .  Vitamin D, Ergocalciferol, (DRISDOL) 1.25 MG (50000 UNIT) CAPS capsule, Take 50,000 Units by mouth every Sunday., Disp: , Rfl:   Allergies  Allergen Reactions  . Prednisone Other (See Comments)    Chest pain  . Clindamycin/Lincomycin Rash  . Influenza Vaccines Hives  . Lodine [Etodolac] Rash         Objective:  Physical Exam  General: AAO x3, NAD  Dermatological: Skin is warm, dry and supple bilateral.  There are no open sores, no  preulcerative lesions, no rash or signs of infection present.  Vascular: Dorsalis Pedis artery and Posterior Tibial artery pedal pulses are 2/4 bilateral with immedate capillary fill time. There is no pain with calf compression, swelling, warmth, erythema.   Neruologic: Grossly intact via light touch bilateral.  Negative Tinel sign.  Musculoskeletal: There is edema mostly localized to the ankle on the left side worse along the lateral aspect.  There is mild discomfort along the sinus tarsi and the lateral aspect of the ankle.  There is no area of pinpoint tenderness.  Flexor, extensor tendons appear to be intact.  Flatfoot is present bilaterally.  Muscular strength  5/5 in all groups tested bilateral.  Gait: Unassisted, Nonantalgic.       Assessment:   65 year old female with left ankle swelling, arthritis    Plan:  -Treatment options discussed including all alternatives, risks, and complications -Etiology of symptoms were discussed -X-rays were obtained and reviewed with the patient.  On the left side there is a negative calcaneal inclination angle.  Arthritic changes present.  Hardware intact from prior surgeries. -Today an unna boot and precautions were advised on when to remove this. -Hopefully the swelling comes down to get back to wearing better supportive shoe also be helpful.  She been having more flexible shoes given swelling. -If no improvement MRI  Vivi Barrack DPM

## 2020-09-20 DIAGNOSIS — K219 Gastro-esophageal reflux disease without esophagitis: Secondary | ICD-10-CM | POA: Diagnosis not present

## 2020-09-20 DIAGNOSIS — I519 Heart disease, unspecified: Secondary | ICD-10-CM | POA: Diagnosis not present

## 2020-09-20 DIAGNOSIS — E039 Hypothyroidism, unspecified: Secondary | ICD-10-CM | POA: Diagnosis not present

## 2020-09-20 DIAGNOSIS — I10 Essential (primary) hypertension: Secondary | ICD-10-CM | POA: Diagnosis not present

## 2020-09-25 DIAGNOSIS — M4155 Other secondary scoliosis, thoracolumbar region: Secondary | ICD-10-CM | POA: Diagnosis not present

## 2020-09-25 DIAGNOSIS — M545 Low back pain, unspecified: Secondary | ICD-10-CM | POA: Diagnosis not present

## 2020-09-26 DIAGNOSIS — M4155 Other secondary scoliosis, thoracolumbar region: Secondary | ICD-10-CM | POA: Diagnosis not present

## 2020-09-26 DIAGNOSIS — M545 Low back pain, unspecified: Secondary | ICD-10-CM | POA: Diagnosis not present

## 2020-10-03 DIAGNOSIS — M545 Low back pain, unspecified: Secondary | ICD-10-CM | POA: Diagnosis not present

## 2020-10-03 DIAGNOSIS — M4155 Other secondary scoliosis, thoracolumbar region: Secondary | ICD-10-CM | POA: Diagnosis not present

## 2020-10-15 DIAGNOSIS — M4155 Other secondary scoliosis, thoracolumbar region: Secondary | ICD-10-CM | POA: Diagnosis not present

## 2020-10-15 DIAGNOSIS — M545 Low back pain, unspecified: Secondary | ICD-10-CM | POA: Diagnosis not present

## 2020-10-17 DIAGNOSIS — M545 Low back pain, unspecified: Secondary | ICD-10-CM | POA: Diagnosis not present

## 2020-10-17 DIAGNOSIS — M4155 Other secondary scoliosis, thoracolumbar region: Secondary | ICD-10-CM | POA: Diagnosis not present

## 2020-10-20 DIAGNOSIS — E039 Hypothyroidism, unspecified: Secondary | ICD-10-CM | POA: Diagnosis not present

## 2020-10-20 DIAGNOSIS — K219 Gastro-esophageal reflux disease without esophagitis: Secondary | ICD-10-CM | POA: Diagnosis not present

## 2020-10-20 DIAGNOSIS — I10 Essential (primary) hypertension: Secondary | ICD-10-CM | POA: Diagnosis not present

## 2020-10-20 DIAGNOSIS — I519 Heart disease, unspecified: Secondary | ICD-10-CM | POA: Diagnosis not present

## 2020-10-22 DIAGNOSIS — E039 Hypothyroidism, unspecified: Secondary | ICD-10-CM | POA: Diagnosis not present

## 2020-10-22 DIAGNOSIS — I519 Heart disease, unspecified: Secondary | ICD-10-CM | POA: Diagnosis not present

## 2020-10-22 DIAGNOSIS — I10 Essential (primary) hypertension: Secondary | ICD-10-CM | POA: Diagnosis not present

## 2020-10-23 ENCOUNTER — Ambulatory Visit: Payer: PPO | Admitting: Podiatry

## 2020-10-23 DIAGNOSIS — M545 Low back pain, unspecified: Secondary | ICD-10-CM | POA: Diagnosis not present

## 2020-10-23 DIAGNOSIS — M4155 Other secondary scoliosis, thoracolumbar region: Secondary | ICD-10-CM | POA: Diagnosis not present

## 2020-10-24 DIAGNOSIS — M4155 Other secondary scoliosis, thoracolumbar region: Secondary | ICD-10-CM | POA: Diagnosis not present

## 2020-10-24 DIAGNOSIS — M545 Low back pain, unspecified: Secondary | ICD-10-CM | POA: Diagnosis not present

## 2020-10-31 DIAGNOSIS — M545 Low back pain, unspecified: Secondary | ICD-10-CM | POA: Diagnosis not present

## 2020-10-31 DIAGNOSIS — M4155 Other secondary scoliosis, thoracolumbar region: Secondary | ICD-10-CM | POA: Diagnosis not present

## 2020-11-06 DIAGNOSIS — Z79899 Other long term (current) drug therapy: Secondary | ICD-10-CM | POA: Diagnosis not present

## 2020-11-06 DIAGNOSIS — M1120 Other chondrocalcinosis, unspecified site: Secondary | ICD-10-CM | POA: Diagnosis not present

## 2020-11-06 DIAGNOSIS — Z6832 Body mass index (BMI) 32.0-32.9, adult: Secondary | ICD-10-CM | POA: Diagnosis not present

## 2020-11-06 DIAGNOSIS — M797 Fibromyalgia: Secondary | ICD-10-CM | POA: Diagnosis not present

## 2020-11-06 DIAGNOSIS — M0609 Rheumatoid arthritis without rheumatoid factor, multiple sites: Secondary | ICD-10-CM | POA: Diagnosis not present

## 2020-11-06 DIAGNOSIS — E669 Obesity, unspecified: Secondary | ICD-10-CM | POA: Diagnosis not present

## 2020-11-06 DIAGNOSIS — M150 Primary generalized (osteo)arthritis: Secondary | ICD-10-CM | POA: Diagnosis not present

## 2020-11-07 DIAGNOSIS — M545 Low back pain, unspecified: Secondary | ICD-10-CM | POA: Diagnosis not present

## 2020-11-07 DIAGNOSIS — M4155 Other secondary scoliosis, thoracolumbar region: Secondary | ICD-10-CM | POA: Diagnosis not present

## 2020-11-20 DIAGNOSIS — E039 Hypothyroidism, unspecified: Secondary | ICD-10-CM | POA: Diagnosis not present

## 2020-11-20 DIAGNOSIS — I10 Essential (primary) hypertension: Secondary | ICD-10-CM | POA: Diagnosis not present

## 2020-11-20 DIAGNOSIS — I519 Heart disease, unspecified: Secondary | ICD-10-CM | POA: Diagnosis not present

## 2020-11-20 DIAGNOSIS — M545 Low back pain, unspecified: Secondary | ICD-10-CM | POA: Diagnosis not present

## 2020-11-20 DIAGNOSIS — M4155 Other secondary scoliosis, thoracolumbar region: Secondary | ICD-10-CM | POA: Diagnosis not present

## 2020-11-23 DIAGNOSIS — M5412 Radiculopathy, cervical region: Secondary | ICD-10-CM | POA: Diagnosis not present

## 2020-11-23 DIAGNOSIS — Z6834 Body mass index (BMI) 34.0-34.9, adult: Secondary | ICD-10-CM | POA: Diagnosis not present

## 2020-11-23 DIAGNOSIS — I10 Essential (primary) hypertension: Secondary | ICD-10-CM | POA: Diagnosis not present

## 2020-11-27 ENCOUNTER — Other Ambulatory Visit: Payer: Self-pay | Admitting: Neurological Surgery

## 2020-11-27 DIAGNOSIS — M5412 Radiculopathy, cervical region: Secondary | ICD-10-CM

## 2020-12-10 DIAGNOSIS — I700 Atherosclerosis of aorta: Secondary | ICD-10-CM | POA: Diagnosis not present

## 2020-12-10 DIAGNOSIS — M797 Fibromyalgia: Secondary | ICD-10-CM | POA: Diagnosis not present

## 2020-12-10 DIAGNOSIS — K317 Polyp of stomach and duodenum: Secondary | ICD-10-CM | POA: Diagnosis not present

## 2020-12-10 DIAGNOSIS — M0600 Rheumatoid arthritis without rheumatoid factor, unspecified site: Secondary | ICD-10-CM | POA: Diagnosis not present

## 2020-12-10 DIAGNOSIS — M5416 Radiculopathy, lumbar region: Secondary | ICD-10-CM | POA: Diagnosis not present

## 2020-12-10 DIAGNOSIS — E785 Hyperlipidemia, unspecified: Secondary | ICD-10-CM | POA: Diagnosis not present

## 2020-12-10 DIAGNOSIS — E039 Hypothyroidism, unspecified: Secondary | ICD-10-CM | POA: Diagnosis not present

## 2020-12-10 DIAGNOSIS — E559 Vitamin D deficiency, unspecified: Secondary | ICD-10-CM | POA: Diagnosis not present

## 2020-12-10 DIAGNOSIS — I10 Essential (primary) hypertension: Secondary | ICD-10-CM | POA: Diagnosis not present

## 2020-12-10 DIAGNOSIS — D509 Iron deficiency anemia, unspecified: Secondary | ICD-10-CM | POA: Diagnosis not present

## 2020-12-10 DIAGNOSIS — M170 Bilateral primary osteoarthritis of knee: Secondary | ICD-10-CM | POA: Diagnosis not present

## 2020-12-11 ENCOUNTER — Ambulatory Visit
Admission: RE | Admit: 2020-12-11 | Discharge: 2020-12-11 | Disposition: A | Discharge status: Home / Self Care | Payer: PPO | Source: Ambulatory Visit | Attending: Neurological Surgery | Admitting: Neurological Surgery

## 2020-12-11 DIAGNOSIS — M5412 Radiculopathy, cervical region: Secondary | ICD-10-CM

## 2020-12-11 DIAGNOSIS — M4802 Spinal stenosis, cervical region: Secondary | ICD-10-CM | POA: Diagnosis not present

## 2020-12-12 DIAGNOSIS — M5412 Radiculopathy, cervical region: Secondary | ICD-10-CM | POA: Diagnosis not present

## 2020-12-19 DIAGNOSIS — R197 Diarrhea, unspecified: Secondary | ICD-10-CM | POA: Diagnosis not present

## 2020-12-19 DIAGNOSIS — R140 Abdominal distension (gaseous): Secondary | ICD-10-CM | POA: Diagnosis not present

## 2020-12-19 DIAGNOSIS — R1013 Epigastric pain: Secondary | ICD-10-CM | POA: Diagnosis not present

## 2020-12-20 DIAGNOSIS — I10 Essential (primary) hypertension: Secondary | ICD-10-CM | POA: Diagnosis not present

## 2020-12-20 DIAGNOSIS — K219 Gastro-esophageal reflux disease without esophagitis: Secondary | ICD-10-CM | POA: Diagnosis not present

## 2020-12-20 DIAGNOSIS — E039 Hypothyroidism, unspecified: Secondary | ICD-10-CM | POA: Diagnosis not present

## 2020-12-20 DIAGNOSIS — I519 Heart disease, unspecified: Secondary | ICD-10-CM | POA: Diagnosis not present

## 2021-01-07 DIAGNOSIS — R002 Palpitations: Secondary | ICD-10-CM | POA: Diagnosis not present

## 2021-01-07 DIAGNOSIS — I519 Heart disease, unspecified: Secondary | ICD-10-CM | POA: Diagnosis not present

## 2021-01-07 DIAGNOSIS — I700 Atherosclerosis of aorta: Secondary | ICD-10-CM | POA: Diagnosis not present

## 2021-01-07 DIAGNOSIS — J302 Other seasonal allergic rhinitis: Secondary | ICD-10-CM | POA: Diagnosis not present

## 2021-01-14 DIAGNOSIS — E039 Hypothyroidism, unspecified: Secondary | ICD-10-CM | POA: Diagnosis not present

## 2021-01-20 DIAGNOSIS — K219 Gastro-esophageal reflux disease without esophagitis: Secondary | ICD-10-CM | POA: Diagnosis not present

## 2021-01-20 DIAGNOSIS — I519 Heart disease, unspecified: Secondary | ICD-10-CM | POA: Diagnosis not present

## 2021-01-20 DIAGNOSIS — E039 Hypothyroidism, unspecified: Secondary | ICD-10-CM | POA: Diagnosis not present

## 2021-01-20 DIAGNOSIS — I10 Essential (primary) hypertension: Secondary | ICD-10-CM | POA: Diagnosis not present

## 2021-01-21 DIAGNOSIS — R002 Palpitations: Secondary | ICD-10-CM | POA: Diagnosis not present

## 2021-01-22 DIAGNOSIS — R002 Palpitations: Secondary | ICD-10-CM | POA: Diagnosis not present

## 2021-01-24 ENCOUNTER — Other Ambulatory Visit: Payer: Self-pay | Admitting: Internal Medicine

## 2021-01-24 DIAGNOSIS — I471 Supraventricular tachycardia, unspecified: Secondary | ICD-10-CM

## 2021-01-24 DIAGNOSIS — R002 Palpitations: Secondary | ICD-10-CM

## 2021-02-06 DIAGNOSIS — M0609 Rheumatoid arthritis without rheumatoid factor, multiple sites: Secondary | ICD-10-CM | POA: Diagnosis not present

## 2021-02-06 DIAGNOSIS — M797 Fibromyalgia: Secondary | ICD-10-CM | POA: Diagnosis not present

## 2021-02-06 DIAGNOSIS — M150 Primary generalized (osteo)arthritis: Secondary | ICD-10-CM | POA: Diagnosis not present

## 2021-02-06 DIAGNOSIS — Z6834 Body mass index (BMI) 34.0-34.9, adult: Secondary | ICD-10-CM | POA: Diagnosis not present

## 2021-02-06 DIAGNOSIS — Z79899 Other long term (current) drug therapy: Secondary | ICD-10-CM | POA: Diagnosis not present

## 2021-02-06 DIAGNOSIS — E669 Obesity, unspecified: Secondary | ICD-10-CM | POA: Diagnosis not present

## 2021-02-06 DIAGNOSIS — M1120 Other chondrocalcinosis, unspecified site: Secondary | ICD-10-CM | POA: Diagnosis not present

## 2021-02-07 DIAGNOSIS — R002 Palpitations: Secondary | ICD-10-CM | POA: Diagnosis not present

## 2021-02-07 DIAGNOSIS — I10 Essential (primary) hypertension: Secondary | ICD-10-CM | POA: Diagnosis not present

## 2021-02-07 DIAGNOSIS — I519 Heart disease, unspecified: Secondary | ICD-10-CM | POA: Diagnosis not present

## 2021-02-07 DIAGNOSIS — J302 Other seasonal allergic rhinitis: Secondary | ICD-10-CM | POA: Diagnosis not present

## 2021-02-20 DIAGNOSIS — I519 Heart disease, unspecified: Secondary | ICD-10-CM | POA: Diagnosis not present

## 2021-02-20 DIAGNOSIS — E039 Hypothyroidism, unspecified: Secondary | ICD-10-CM | POA: Diagnosis not present

## 2021-02-20 DIAGNOSIS — I10 Essential (primary) hypertension: Secondary | ICD-10-CM | POA: Diagnosis not present

## 2021-02-20 DIAGNOSIS — K219 Gastro-esophageal reflux disease without esophagitis: Secondary | ICD-10-CM | POA: Diagnosis not present

## 2021-03-08 ENCOUNTER — Other Ambulatory Visit: Payer: Self-pay

## 2021-03-08 ENCOUNTER — Ambulatory Visit: Payer: PPO

## 2021-03-08 DIAGNOSIS — R002 Palpitations: Secondary | ICD-10-CM | POA: Diagnosis not present

## 2021-03-08 DIAGNOSIS — I471 Supraventricular tachycardia: Secondary | ICD-10-CM | POA: Diagnosis not present

## 2021-03-08 DIAGNOSIS — I700 Atherosclerosis of aorta: Secondary | ICD-10-CM | POA: Diagnosis not present

## 2021-03-09 LAB — PCV CARDIAC STRESS TEST
Angina Index: 0
ST Depression (mm): 0 mm

## 2021-03-22 DIAGNOSIS — I10 Essential (primary) hypertension: Secondary | ICD-10-CM | POA: Diagnosis not present

## 2021-03-22 DIAGNOSIS — E039 Hypothyroidism, unspecified: Secondary | ICD-10-CM | POA: Diagnosis not present

## 2021-03-22 DIAGNOSIS — I519 Heart disease, unspecified: Secondary | ICD-10-CM | POA: Diagnosis not present

## 2021-03-22 DIAGNOSIS — K219 Gastro-esophageal reflux disease without esophagitis: Secondary | ICD-10-CM | POA: Diagnosis not present

## 2021-04-09 DIAGNOSIS — R0989 Other specified symptoms and signs involving the circulatory and respiratory systems: Secondary | ICD-10-CM | POA: Diagnosis not present

## 2021-04-09 DIAGNOSIS — M25512 Pain in left shoulder: Secondary | ICD-10-CM | POA: Diagnosis not present

## 2021-04-09 DIAGNOSIS — M5412 Radiculopathy, cervical region: Secondary | ICD-10-CM | POA: Diagnosis not present

## 2021-04-09 DIAGNOSIS — I350 Nonrheumatic aortic (valve) stenosis: Secondary | ICD-10-CM | POA: Diagnosis not present

## 2021-04-12 DIAGNOSIS — I10 Essential (primary) hypertension: Secondary | ICD-10-CM | POA: Diagnosis not present

## 2021-04-12 DIAGNOSIS — Z6835 Body mass index (BMI) 35.0-35.9, adult: Secondary | ICD-10-CM | POA: Diagnosis not present

## 2021-04-12 DIAGNOSIS — M5412 Radiculopathy, cervical region: Secondary | ICD-10-CM | POA: Diagnosis not present

## 2021-04-15 ENCOUNTER — Other Ambulatory Visit: Payer: Self-pay | Admitting: Neurological Surgery

## 2021-04-17 NOTE — Pre-Procedure Instructions (Signed)
Surgical Instructions    Your procedure is scheduled on Tuesday, November 1st.  Report to [facility] Main Entrance "A" at 9:20 A.M., then check in with the Admitting office.  Call this number if you have problems the morning of surgery:  [phone]   If you have any questions prior to your surgery date call [phone]: Open Monday-Friday 8am-4pm    Remember:  Do not eat after midnight the night before your surgery  You may drink clear liquids until 8:20 a.m. the morning of your surgery.   Clear liquids allowed are: Water, Non-Citrus Juices (without pulp), Carbonated Beverages, Clear Tea, Black Coffee Only, and Gatorade    Take these medicines the morning of surgery with A SIP OF WATER  buPROPion (WELLBUTRIN XL)  cetirizine (KLS ALLER-TEC) gabapentin (NEURONTIN)  levothyroxine (SYNTHROID) omeprazole (PRILOSEC) pramipexole (MIRAPEX)  As needed: acetaminophen (TYLENOL) meclizine (ANTIVERT) tiZANidine (ZANAFLEX)  traMADol Janean Sark)   Consult with your primary physician or Rheumatologist regarding your hydroxychloroquine (PLAQUENIL) and leflunomide (ARAVA). These types of medications may need to be held prior to surgery.   As of today, STOP taking any Aspirin (unless otherwise instructed by your surgeon) Aleve, Naproxen, Ibuprofen, Motrin, Advil, Goody's, BC's, all herbal medications, fish oil, and all vitamins. This includes: meloxicam (MOBIC).                    Do NOT Smoke (Tobacco/Vaping) or drink Alcohol 24 hours prior to your procedure.  If you use a CPAP at night, you may bring all equipment for your overnight stay.   Contacts, glasses, piercing's, hearing aid's, dentures or partials may not be worn into surgery, please bring cases for these belongings.    For patients admitted to the hospital, discharge time will be determined by your treatment team.   Patients discharged the day of surgery will not be allowed to drive home, and someone needs to stay with them for  24 hours.  NO VISITORS WILL BE ALLOWED IN PRE-OP WHERE PATIENTS GET READY FOR SURGERY.  ONLY 1 SUPPORT PERSON MAY BE PRESENT IN THE WAITING ROOM WHILE YOU ARE IN SURGERY.  IF YOU ARE TO BE ADMITTED, ONCE YOU ARE IN YOUR ROOM YOU WILL BE ALLOWED TWO (2) VISITORS.  Minor children may have two parents present. Special consideration for safety and communication needs will be reviewed on a case by case basis.   Special instructions:   [facility]- Preparing For Surgery  Before surgery, you can play an important role. Because skin is not sterile, your skin needs to be as free of germs as possible. You can reduce the number of germs on your skin by washing with CHG (chlorahexidine gluconate) Soap before surgery.  CHG is an antiseptic cleaner which kills germs and bonds with the skin to continue killing germs even after washing.    Oral Hygiene is also important to reduce your risk of infection.  Remember - BRUSH YOUR TEETH THE MORNING OF SURGERY WITH YOUR REGULAR TOOTHPASTE  Please do not use if you have an allergy to CHG or antibacterial soaps. If your skin becomes reddened/irritated stop using the CHG.  Do not shave (including legs and underarms) for at least 48 hours prior to first CHG shower. It is OK to shave your face.  Please follow these instructions carefully.   Shower the NIGHT BEFORE SURGERY and the MORNING OF SURGERY  If you chose to wash your hair, wash your hair first as usual with your normal shampoo.  After you shampoo,  rinse your hair and body thoroughly to remove the shampoo.  Use CHG Soap as you would any other liquid soap. You can apply CHG directly to the skin and wash gently with a scrungie or a clean washcloth.   Apply the CHG Soap to your body ONLY FROM THE NECK DOWN.  Do not use on open wounds or open sores. Avoid contact with your eyes, ears, mouth and genitals (private parts). Wash Face and genitals (private parts)  with your normal soap.   Wash thoroughly, paying  special attention to the area where your surgery will be performed.  Thoroughly rinse your body with warm water from the neck down.  DO NOT shower/wash with your normal soap after using and rinsing off the CHG Soap.  Pat yourself dry with a CLEAN TOWEL.  Wear CLEAN PAJAMAS to bed the night before surgery  Place CLEAN SHEETS on your bed the night before your surgery  DO NOT SLEEP WITH PETS.   Day of Surgery: Shower with CHG soap. Do not wear jewelry, make up, nail polish, gel polish, artificial nails, or any other type of covering on natural nails including finger and toenails. If patients have artificial nails, gel coating, etc. that need to be removed by a nail salon please have this removed prior to surgery. Surgery may need to be canceled/delayed if the surgeon/ anesthesia feels like the patient is unable to be adequately monitored. Do not wear lotions, powders, perfumes, or deodorant. Do not shave 48 hours prior to surgery.  Do not bring valuables to the hospital. [facility] is not responsible for any belongings or valuables. Wear Clean/Comfortable clothing the morning of surgery Remember to brush your teeth WITH YOUR REGULAR TOOTHPASTE.   Please read over the following fact sheets that you were given.   3 days prior to your procedure or After your COVID test   You are not required to quarantine however you are required to wear a well-fitting mask when you are out and around people not in your household. If your mask becomes wet or soiled, replace with a new one.   Wash your hands often with soap and water for 20 seconds or clean your hands with an alcohol-based hand sanitizer that contains at least 60% alcohol.   Do not share personal items.   Notify your provider:  o if you are in close contact with someone who has COVID  o or if you develop a fever of 100.4 or greater, sneezing, cough, sore throat, shortness of breath or body aches.

## 2021-04-18 ENCOUNTER — Other Ambulatory Visit: Payer: Self-pay

## 2021-04-18 ENCOUNTER — Encounter (HOSPITAL_COMMUNITY): Payer: Self-pay

## 2021-04-18 ENCOUNTER — Encounter (HOSPITAL_COMMUNITY)
Admission: RE | Admit: 2021-04-18 | Discharge: 2021-04-18 | Disposition: A | Discharge status: Home / Self Care | Payer: PPO | Source: Ambulatory Visit | Attending: Neurological Surgery | Admitting: Neurological Surgery

## 2021-04-18 VITALS — BP 145/73 | HR 86 | Temp 97.9°F | Resp 18 | Ht 62.0 in | Wt 211.1 lb

## 2021-04-18 DIAGNOSIS — Z01818 Encounter for other preprocedural examination: Secondary | ICD-10-CM | POA: Insufficient documentation

## 2021-04-18 DIAGNOSIS — I10 Essential (primary) hypertension: Secondary | ICD-10-CM | POA: Insufficient documentation

## 2021-04-18 DIAGNOSIS — M48062 Spinal stenosis, lumbar region with neurogenic claudication: Secondary | ICD-10-CM | POA: Insufficient documentation

## 2021-04-18 HISTORY — DX: Cardiac arrhythmia, unspecified: I49.9

## 2021-04-18 HISTORY — DX: Cardiac murmur, unspecified: R01.1

## 2021-04-18 LAB — BASIC METABOLIC PANEL
Anion gap: 7 (ref 5–15)
BUN: 21 mg/dL (ref 8–23)
CO2: 25 mmol/L (ref 22–32)
Calcium: 9.3 mg/dL (ref 8.9–10.3)
Chloride: 103 mmol/L (ref 98–111)
Creatinine, Ser: 0.85 mg/dL (ref 0.44–1.00)
GFR, Estimated: >60 mL/min (ref >60)
Glucose, Bld: 98 mg/dL (ref 70–99)
Potassium: 3.8 mmol/L (ref 3.5–5.1)
Sodium: 135 mmol/L (ref 135–145)

## 2021-04-18 LAB — SURGICAL PCR SCREEN
MRSA, PCR: NEGATIVE
Staphylococcus aureus: NEGATIVE

## 2021-04-18 LAB — CBC
HCT: 38.4 % (ref 36.0–46.0)
Hemoglobin: 12.6 g/dL (ref 12.0–15.0)
MCH: 29.7 pg (ref 26.0–34.0)
MCHC: 32.8 g/dL (ref 30.0–36.0)
MCV: 90.6 fL (ref 80.0–100.0)
Platelets: 249 10*3/uL (ref 150–400)
RBC: 4.24 MIL/uL (ref 3.87–5.11)
RDW: 14 % (ref 11.5–15.5)
WBC: 4.8 10*3/uL (ref 4.0–10.5)
nRBC: 0 % (ref 0.0–0.2)

## 2021-04-18 NOTE — Progress Notes (Signed)
PCP - Dr. Georgianne Fick Cardiologist - Denies Rhuematologist: Dr. Zenovia Jordan  PPM/ICD - Denies  Chest x-ray - 07/01/20 - 1 view EKG - 04/18/21 Stress Test - 03/09/21 ECHO - 05/03/20 Cardiac Cath - Denies  Sleep Study - Denies  Patient denies having diabetes.  Blood Thinner Instructions: N/A Aspirin Instructions: N/A  ERAS Protcol - Yes PRE-SURGERY Ensure or G2- No  COVID TEST- Scheduled 04/22/21 @ 0900   Anesthesia review: Yes, abnormal EKG  Patient denies shortness of breath, fever, cough and chest pain at PAT appointment   All instructions explained to the patient, with a verbal understanding of the material. Patient agrees to go over the instructions while at home for a better understanding. Patient also instructed to self quarantine after being tested for COVID-19. The opportunity to ask questions was provided.

## 2021-04-22 ENCOUNTER — Other Ambulatory Visit (HOSPITAL_COMMUNITY)
Admission: RE | Admit: 2021-04-22 | Discharge: 2021-04-22 | Disposition: A | Discharge status: Home / Self Care | Payer: PPO | Source: Ambulatory Visit | Attending: Neurological Surgery | Admitting: Neurological Surgery

## 2021-04-22 DIAGNOSIS — Z20822 Contact with and (suspected) exposure to covid-19: Secondary | ICD-10-CM | POA: Insufficient documentation

## 2021-04-22 DIAGNOSIS — Z01812 Encounter for preprocedural laboratory examination: Secondary | ICD-10-CM | POA: Diagnosis not present

## 2021-04-22 DIAGNOSIS — Z01818 Encounter for other preprocedural examination: Secondary | ICD-10-CM

## 2021-04-22 LAB — SARS CORONAVIRUS 2 (TAT 6-24 HRS): SARS Coronavirus 2: NEGATIVE

## 2021-04-22 NOTE — Progress Notes (Signed)
Anesthesia Chart Review:  Patient's PCP Dr. Nicholos Johns recently ordered cardiac testing to evaluate patient report of "heart fluttering".  Echo November 2021 showed EF 64%, normal wall motion, mild AS (mean gradient 13 mmHg) impaired relaxation, mild LAE.  Cardiac event monitor July 2022 showed 13 runs of supraventricular tachycardia.  Stress test was ordered to further evaluate.  Exercise treadmill stress test September 2022 showed normal heart rate and hemodynamic response, stress EKG showed no ischemic changes, ruled intermediate risk due to low exercise capacity (patient achieved 6.7 METS).  She was prescribed prn diltiazem for episodes of palpitations.  Follows with rheumatology for history of rheumatoid arthritis.  Maintained on methotrexate, leflunomide, and hydroxychloroquine.  Preop labs reviewed, WNL.  EKG 04/18/2021: Sinus rhythm with marked sinus arrhythmia.  Rate 88.  Exercise treadmill stress test 03/08/2021: Exercise treadmill stress test performed using Bruce protocol.  Patient reached 6.7 METS, and 101% of age predicted maximum heart rate.  Exercise capacity was low.  No chest pain reported, dyspnea and dizziness reported.  Normal heart rate and hemodynamic response. Stress EKG revealed no ischemic changes. Intermediate risk study due to low exercise capacity.   TTE 05/03/2020 (copy on chart): Left ventricle: LV chamber and wall dimensions are normal.  LV chamber size is normal.  LV wall thickness is normal.  LV systolic function is normal.  There are no wall motion abnormalities observed.  The calculated LV ejection fraction is 64.22%.  There is impaired left ventricular relaxation. Right ventricle: There is normal right ventricular size, wall dimension, and systolic function. Left atrium: Left atrium chamber is mildly dilated.  LA diameter is 4.24 cm.  The LA volume index is 42.29 mL/m. Right atrium: RA chamber size is normal. Aortic valve: There is a trileaflet aortic valve.   The aortic valve is mildly calcified.  There is no aortic regurgitation.  There is mild aortic valve stenosis. Mitral valve: There is normal mitral valve structure and function.  There is no mitral valve stenosis or regurgitation. Tricuspid valve: There is normal tricuspid valve structure and function.  There is trace tricuspid regurgitation. Pulmonary valve: There is normal pulmonic valve structure and function.  There is no pulmonic valve stenosis or regurgitation. Pericardium: The pericardium is normal. Aorta: The size of the visualized portion of the aortic root is within normal limits. Venous: The inferior vena cava dimension is normal.    [facility]/Anesthesiology Phone [phone] 04/22/2021 2:39 PM

## 2021-04-23 NOTE — H&P (Addendum)
[name] is an 65 y.o. female.   Chief Complaint: Neck and left shoulder and arm pain HPI: [name] is a 65 year old individual whose had extensive lumbar spondylosis and had undergone fusion from T12 down to the sacrum this last year.  She recovered well from that surgery but has been developing increasing neck and shoulder pain she has had previous anterior cervical decompression arthrodesis.  She now has evidence of advanced spondylosis at the C6-7 T1 level and also at T1-T2 causing severe left foraminal stenosis.  She has been advised regarding surgical decompression using a posterior approach and Metrix technique for foraminotomies at C7-T1 and T1-T2 on the left.  Past Medical History:  Diagnosis Date   Anxiety    Arthritis    Complication of anesthesia    during surgery she could hear staff talking and could feel them preping her arm over 30 yrs. ago   Dysrhythmia    Patient states back in July she was told by PCP that she had an irregular heart rhythm, but was not told what it was.   Fibromyalgia    GERD (gastroesophageal reflux disease)    Heart murmur    Hypertension    Hypothyroidism    Restless leg syndrome    Staph infection    2015   Wears glasses     Past Surgical History:  Procedure Laterality Date   ABDOMINAL HYSTERECTOMY  1981   partial   ANTERIOR LAT LUMBAR FUSION N/A 06/28/2020   Procedure: Thoracic Twelve to Lumbar One, Lumbar One-Two; Anterior lateral fusion, pedicle screws Thoracic Ten to Lumbar Three, Laminectomy Thoracic Twelve--Lumbar Two;  Surgeon: Brianna Abu, MD;  Location: [facility] OR;  Service: Neurosurgery;  Laterality: N/A;   APPLICATION OF ROBOTIC ASSISTANCE FOR SPINAL PROCEDURE N/A 06/28/2020   Procedure: APPLICATION OF ROBOTIC ASSISTANCE FOR SPINAL PROCEDURE;  Surgeon: Brianna Abu, MD;  Location: [facility] OR;  Service: Neurosurgery;  Laterality: N/A;   APPLICATION OF WOUND VAC N/A 04/25/2014   Procedure: APPLICATION OF WOUND VAC;  Surgeon: Brianna Shorts, MD;  Location: [facility] NEURO ORS;  Service: Neurosurgery;  Laterality: N/A;   BACK SURGERY  2007   BLADDER SUSPENSION     2004   CERVICAL FUSION  1990   CERVICAL FUSION     2000   CYST EXCISION Left 1985   wrist   CYST EXCISION  1985   chest   FOOT SURGERY Right 2009   reconstruction with hardware   FOOT SURGERY Left 2013   reconstruction with hardware   LUMBAR FUSION  03/2014   LUMBAR PERCUTANEOUS PEDICLE SCREW 4 LEVEL N/A 06/28/2020   Procedure: LUMBAR PERCUTANEOUS PEDICLE SCREW Thoracic Ten to Lumbar Three, Laminectomy Thoracic Twelve--Lumbar Two;  Surgeon: Brianna Abu, MD;  Location: [facility] OR;  Service: Neurosurgery;  Laterality: N/A;  posterior   LUMBAR WOUND DEBRIDEMENT N/A 04/25/2014   Procedure: LUMBAR WOUND DEBRIDEMENT Placement of Woundvac);  Surgeon: Brianna Shorts, MD;  Location: [facility] NEURO ORS;  Service: Neurosurgery;  Laterality: N/A;   PICC LINE PLACE PERIPHERAL ([facility] HX)     2015-infusion therapy secondary to STAPH infection    TONSILLECTOMY     TOTAL KNEE ARTHROPLASTY Bilateral 02/27/2015   Procedure: RIGHT TOTAL KNEE ARTHROPLASTY, LEFT KNEE CORTISONE INJECTION;  Surgeon: Brianna Romans, MD;  Location: [facility] ORS;  Service: Orthopedics;  Laterality: Bilateral;   TOTAL KNEE ARTHROPLASTY Left 06/19/2015   Procedure: LEFT TOTAL KNEE ARTHROPLASTY;  Surgeon: Brianna Romans, MD;  Location: [facility] ORS;  Service: Orthopedics;  Laterality:  Left;   TUBAL LIGATION      No family history on file. Social History:  reports that she has never smoked. She has never used smokeless tobacco. She reports that she does not drink alcohol and does not use drugs.  Allergies:  Allergies  Allergen Reactions   Prednisone Other (See Comments)    Chest pain   Clindamycin/Lincomycin Rash   Influenza Vaccines Hives   Lodine [Etodolac] Rash    No medications prior to admission.    Results for orders placed or performed during the hospital encounter of 04/22/21 (from the past 48 hour(s))  SARS  CORONAVIRUS 2 (TAT 6-24 HRS) Nasopharyngeal Nasopharyngeal Swab     Status: None   Collection Time: 04/22/21  9:10 AM   Specimen: Nasopharyngeal Swab  Result Value Ref Range   SARS Coronavirus 2 NEGATIVE NEGATIVE    Comment: (NOTE) SARS-CoV-2 target nucleic acids are NOT DETECTED.  The SARS-CoV-2 RNA is generally detectable in upper and lower respiratory specimens during the acute phase of infection. Negative results do not preclude SARS-CoV-2 infection, do not rule out co-infections with other pathogens, and should not be used as the sole basis for treatment or other patient management decisions. Negative results must be combined with clinical observations, patient history, and epidemiological information. The expected result is Negative.  Fact Sheet for Patients: HairSlick.no  Fact Sheet for Healthcare Providers: quierodirigir.com  This test is not yet approved or cleared by the [place] FDA and  has been authorized for detection and/or diagnosis of SARS-CoV-2 by FDA under an Emergency Use Authorization (EUA). This EUA will remain  in effect (meaning this test can be used) for the duration of the COVID-19 declaration under Se ction 564(b)(1) of the Act, 21 U.S.C. section 360bbb-3(b)(1), unless the authorization is terminated or revoked sooner.  Performed at [facility], [address] [place], [place] [address]    No results found.  Review of Systems  Constitutional: Negative.   Musculoskeletal:  Positive for neck pain.  Skin: Negative.   Neurological:  Positive for numbness.  Hematological: Negative.   Psychiatric/Behavioral: Negative.    All other systems reviewed and are negative.  There were no vitals taken for this visit. Physical Exam Constitutional:      Appearance: Normal appearance.  HENT:     Head: Normocephalic and atraumatic.     Right Ear: Tympanic membrane normal.     Left Ear:  Tympanic membrane normal.     Nose: Nose normal.     Mouth/Throat:     Mouth: Mucous membranes are moist.  Eyes:     Extraocular Movements: Extraocular movements intact.     Conjunctiva/sclera: Conjunctivae normal.     Pupils: Pupils are equal, round, and reactive to light.  Cardiovascular:     Rate and Rhythm: Normal rate and regular rhythm.     Pulses: Normal pulses.     Heart sounds: Normal heart sounds.  Pulmonary:     Effort: Pulmonary effort is normal.     Breath sounds: Normal breath sounds.  Abdominal:     General: Abdomen is flat. Bowel sounds are normal.     Palpations: Abdomen is soft.  Musculoskeletal:        General: Normal range of motion.     Cervical back: Normal range of motion and neck supple.  Skin:    General: Skin is warm and dry.     Capillary Refill: Capillary refill takes less than 2 seconds.  Neurological:  Mental Status: She is alert.     Comments: Mild intrinsic weakness in the left upper extremity triceps function appears normal absent triceps reflex biceps function is normal with 1+ bicep reflex bilaterally sensation appears intact in the distal upper extremities bilaterally.  Station and gait are normal.  Psychiatric:        Mood and Affect: Mood normal.        Behavior: Behavior normal.        Thought Content: Thought content normal.        Judgment: Judgment normal.     Assessment/Plan Cervical laminal foraminotomies C7-T1 T1-T2 on the left using Metrix retractor system.  Diagnosis left C8 and T1 radiculopathies secondary to advanced spondylosis.  History of fusion in the cervical spine from C5-C7  [name], MD 04/23/2021, 8:01 AM

## 2021-04-25 ENCOUNTER — Encounter (HOSPITAL_COMMUNITY): Payer: Self-pay | Admitting: Neurological Surgery

## 2021-04-25 ENCOUNTER — Ambulatory Visit (HOSPITAL_COMMUNITY): Payer: PPO

## 2021-04-25 ENCOUNTER — Encounter (HOSPITAL_COMMUNITY)
Admission: RE | Disposition: A | Discharge status: Home / Self Care | Payer: Self-pay | Source: Home / Self Care | Attending: Neurological Surgery

## 2021-04-25 ENCOUNTER — Other Ambulatory Visit: Payer: Self-pay

## 2021-04-25 ENCOUNTER — Ambulatory Visit (HOSPITAL_COMMUNITY): Payer: PPO | Admitting: Physician Assistant

## 2021-04-25 ENCOUNTER — Observation Stay (HOSPITAL_COMMUNITY)
Admission: RE | Admit: 2021-04-25 | Discharge: 2021-04-26 | Disposition: A | Discharge status: Home / Self Care | Payer: PPO | Attending: Neurological Surgery | Admitting: Neurological Surgery

## 2021-04-25 DIAGNOSIS — E039 Hypothyroidism, unspecified: Secondary | ICD-10-CM | POA: Insufficient documentation

## 2021-04-25 DIAGNOSIS — I10 Essential (primary) hypertension: Secondary | ICD-10-CM | POA: Diagnosis not present

## 2021-04-25 DIAGNOSIS — M4322 Fusion of spine, cervical region: Secondary | ICD-10-CM | POA: Diagnosis not present

## 2021-04-25 DIAGNOSIS — Z96653 Presence of artificial knee joint, bilateral: Secondary | ICD-10-CM | POA: Diagnosis not present

## 2021-04-25 DIAGNOSIS — M5413 Radiculopathy, cervicothoracic region: Secondary | ICD-10-CM | POA: Diagnosis not present

## 2021-04-25 DIAGNOSIS — M5412 Radiculopathy, cervical region: Secondary | ICD-10-CM | POA: Diagnosis not present

## 2021-04-25 DIAGNOSIS — M4723 Other spondylosis with radiculopathy, cervicothoracic region: Secondary | ICD-10-CM | POA: Diagnosis not present

## 2021-04-25 DIAGNOSIS — Z419 Encounter for procedure for purposes other than remedying health state, unspecified: Secondary | ICD-10-CM

## 2021-04-25 DIAGNOSIS — Z981 Arthrodesis status: Secondary | ICD-10-CM | POA: Diagnosis not present

## 2021-04-25 HISTORY — PX: POSTERIOR CERVICAL LAMINECTOMY WITH MET- RX: SHX6035

## 2021-04-25 SURGERY — POSTERIOR CERVICAL LAMINECTOMY WITH MET- RX
Anesthesia: General | Site: Back | Laterality: Left

## 2021-04-25 MED ORDER — SODIUM CHLORIDE 0.9 % IV SOLN
250.0000 mL | INTRAVENOUS | Status: DC
  Administered 2021-04-25: 250 mL via INTRAVENOUS

## 2021-04-25 MED ORDER — MAGNESIUM OXIDE -MG SUPPLEMENT 400 (240 MG) MG PO TABS: 400.0000 mg | ORAL_TABLET | Freq: Every day | ORAL | Status: DC

## 2021-04-25 MED ORDER — LIDOCAINE-EPINEPHRINE 1 %-1:100000 IJ SOLN
INTRAMUSCULAR | Status: DC | PRN
  Administered 2021-04-25: 5 mL

## 2021-04-25 MED ORDER — LORATADINE 10 MG PO TABS: 10.0000 mg | ORAL_TABLET | Freq: Every day | ORAL | Status: DC

## 2021-04-25 MED ORDER — ACETAMINOPHEN 650 MG RE SUPP: 650.0000 mg | RECTAL | Status: DC | PRN

## 2021-04-25 MED ORDER — SODIUM CHLORIDE 0.9% FLUSH
3.0000 mL | Freq: Two times a day (BID) | INTRAVENOUS | Status: DC
  Administered 2021-04-25 (×2): 3 mL via INTRAVENOUS

## 2021-04-25 MED ORDER — SUGAMMADEX SODIUM 200 MG/2ML IV SOLN
INTRAVENOUS | Status: DC | PRN
  Administered 2021-04-25: 200 mg via INTRAVENOUS

## 2021-04-25 MED ORDER — FENTANYL CITRATE (PF) 100 MCG/2ML IJ SOLN
INTRAMUSCULAR | Status: AC
  Filled 2021-04-25: qty 2

## 2021-04-25 MED ORDER — PHENOL 1.4 % MT LIQD: 1.0000 | OROMUCOSAL | Status: DC | PRN

## 2021-04-25 MED ORDER — BUPROPION HCL ER (XL) 300 MG PO TB24
450.0000 mg | ORAL_TABLET | Freq: Every day | ORAL | Status: DC
  Filled 2021-04-25 (×2): qty 1

## 2021-04-25 MED ORDER — METHOCARBAMOL 1000 MG/10ML IJ SOLN
500.0000 mg | Freq: Four times a day (QID) | INTRAVENOUS | Status: DC | PRN
  Filled 2021-04-25: qty 5

## 2021-04-25 MED ORDER — PROPOFOL 10 MG/ML IV BOLUS
INTRAVENOUS | Status: DC | PRN
  Administered 2021-04-25: 120 mg via INTRAVENOUS
  Administered 2021-04-25: 50 mg via INTRAVENOUS

## 2021-04-25 MED ORDER — FENTANYL CITRATE (PF) 250 MCG/5ML IJ SOLN
INTRAMUSCULAR | Status: AC
  Filled 2021-04-25: qty 5

## 2021-04-25 MED ORDER — FENTANYL CITRATE (PF) 250 MCG/5ML IJ SOLN
INTRAMUSCULAR | Status: DC | PRN
  Administered 2021-04-25 (×2): 50 ug via INTRAVENOUS
  Administered 2021-04-25: 100 ug via INTRAVENOUS

## 2021-04-25 MED ORDER — BUPIVACAINE HCL (PF) 0.5 % IJ SOLN
INTRAMUSCULAR | Status: AC
  Filled 2021-04-25: qty 30

## 2021-04-25 MED ORDER — LOSARTAN POTASSIUM-HCTZ 100-12.5 MG PO TABS: 1.0000 | ORAL_TABLET | Freq: Every morning | ORAL | Status: DC

## 2021-04-25 MED ORDER — ROCURONIUM BROMIDE 10 MG/ML (PF) SYRINGE
PREFILLED_SYRINGE | INTRAVENOUS | Status: DC | PRN
  Administered 2021-04-25: 80 mg via INTRAVENOUS
  Administered 2021-04-25: 20 mg via INTRAVENOUS

## 2021-04-25 MED ORDER — BACITRACIN ZINC 500 UNIT/GM EX OINT
TOPICAL_OINTMENT | CUTANEOUS | Status: AC
  Filled 2021-04-25: qty 28.35

## 2021-04-25 MED ORDER — HYDROCODONE-ACETAMINOPHEN 5-325 MG PO TABS
1.0000 | ORAL_TABLET | ORAL | Status: DC | PRN
  Administered 2021-04-25 – 2021-04-26 (×4): 2 via ORAL
  Filled 2021-04-25 (×4): qty 2

## 2021-04-25 MED ORDER — EPHEDRINE SULFATE 50 MG/ML IJ SOLN
INTRAMUSCULAR | Status: DC | PRN
  Administered 2021-04-25 (×2): 5 mg via INTRAVENOUS
  Administered 2021-04-25: 10 mg via INTRAVENOUS

## 2021-04-25 MED ORDER — MIDAZOLAM HCL 5 MG/5ML IJ SOLN
INTRAMUSCULAR | Status: DC | PRN
Start: 2021-04-25 — End: 2021-04-25
  Administered 2021-04-25: 2 mg via INTRAVENOUS

## 2021-04-25 MED ORDER — DOCUSATE SODIUM 100 MG PO CAPS
100.0000 mg | ORAL_CAPSULE | Freq: Two times a day (BID) | ORAL | Status: DC
  Administered 2021-04-25 – 2021-04-26 (×3): 100 mg via ORAL
  Filled 2021-04-25 (×3): qty 1

## 2021-04-25 MED ORDER — POLYETHYLENE GLYCOL 3350 17 G PO PACK: 17.0000 g | PACK | Freq: Every day | ORAL | Status: DC | PRN

## 2021-04-25 MED ORDER — ONDANSETRON HCL 4 MG/2ML IJ SOLN
INTRAMUSCULAR | Status: DC | PRN
  Administered 2021-04-25: 4 mg via INTRAVENOUS

## 2021-04-25 MED ORDER — MIDAZOLAM HCL 2 MG/2ML IJ SOLN
INTRAMUSCULAR | Status: AC
  Filled 2021-04-25: qty 2

## 2021-04-25 MED ORDER — TIZANIDINE HCL 4 MG PO TABS: 4.0000 mg | ORAL_TABLET | Freq: Three times a day (TID) | ORAL | Status: DC | PRN

## 2021-04-25 MED ORDER — CEFAZOLIN SODIUM-DEXTROSE 2-4 GM/100ML-% IV SOLN
2.0000 g | INTRAVENOUS | Status: AC
  Administered 2021-04-25: 2 g via INTRAVENOUS
  Filled 2021-04-25: qty 100

## 2021-04-25 MED ORDER — ACETAMINOPHEN 500 MG PO TABS: 1000.0000 mg | ORAL_TABLET | Freq: Once | ORAL | Status: DC

## 2021-04-25 MED ORDER — LOSARTAN POTASSIUM 50 MG PO TABS: 100.0000 mg | ORAL_TABLET | Freq: Every day | ORAL | Status: DC

## 2021-04-25 MED ORDER — FENTANYL CITRATE (PF) 100 MCG/2ML IJ SOLN
25.0000 ug | INTRAMUSCULAR | Status: DC | PRN
  Administered 2021-04-25 (×3): 50 ug via INTRAVENOUS

## 2021-04-25 MED ORDER — SENNOSIDES 8.6 MG PO TABS: 1.0000 | ORAL_TABLET | Freq: Every day | ORAL | Status: DC

## 2021-04-25 MED ORDER — SODIUM CHLORIDE 0.9% FLUSH: 3.0000 mL | INTRAVENOUS | Status: DC | PRN

## 2021-04-25 MED ORDER — THROMBIN 5000 UNITS EX SOLR
CUTANEOUS | Status: DC | PRN
  Administered 2021-04-25 (×2): 5000 [IU] via TOPICAL

## 2021-04-25 MED ORDER — GABAPENTIN 300 MG PO CAPS
300.0000 mg | ORAL_CAPSULE | Freq: Two times a day (BID) | ORAL | Status: DC
  Administered 2021-04-25 – 2021-04-26 (×3): 300 mg via ORAL
  Filled 2021-04-25 (×3): qty 1

## 2021-04-25 MED ORDER — FLEET ENEMA 7-19 GM/118ML RE ENEM: 1.0000 | ENEMA | Freq: Once | RECTAL | Status: DC | PRN

## 2021-04-25 MED ORDER — THROMBIN 5000 UNITS EX SOLR
CUTANEOUS | Status: AC
  Filled 2021-04-25: qty 15000

## 2021-04-25 MED ORDER — ALPRAZOLAM 0.5 MG PO TABS: 0.2500 mg | ORAL_TABLET | Freq: Every evening | ORAL | Status: DC | PRN

## 2021-04-25 MED ORDER — HYDROMORPHONE HCL 1 MG/ML IJ SOLN
INTRAMUSCULAR | Status: AC
  Filled 2021-04-25: qty 1

## 2021-04-25 MED ORDER — LEVOTHYROXINE SODIUM 100 MCG PO TABS
200.0000 ug | ORAL_TABLET | Freq: Every day | ORAL | Status: DC
  Administered 2021-04-25: 200 ug via ORAL
  Filled 2021-04-25: qty 2

## 2021-04-25 MED ORDER — ONDANSETRON HCL 4 MG/2ML IJ SOLN: 4.0000 mg | Freq: Four times a day (QID) | INTRAMUSCULAR | Status: DC | PRN

## 2021-04-25 MED ORDER — CHLORHEXIDINE GLUCONATE CLOTH 2 % EX PADS: 6.0000 | MEDICATED_PAD | Freq: Once | CUTANEOUS | Status: DC

## 2021-04-25 MED ORDER — BUPIVACAINE HCL (PF) 0.5 % IJ SOLN
INTRAMUSCULAR | Status: DC | PRN
  Administered 2021-04-25: 20 mL
  Administered 2021-04-25: 5 mL

## 2021-04-25 MED ORDER — TRAMADOL HCL 50 MG PO TABS: 50.0000 mg | ORAL_TABLET | Freq: Four times a day (QID) | ORAL | Status: DC | PRN

## 2021-04-25 MED ORDER — LIDOCAINE-EPINEPHRINE 1 %-1:100000 IJ SOLN
INTRAMUSCULAR | Status: AC
  Filled 2021-04-25: qty 1

## 2021-04-25 MED ORDER — THROMBIN 5000 UNITS EX SOLR
OROMUCOSAL | Status: DC | PRN
  Administered 2021-04-25: 5 mL via TOPICAL

## 2021-04-25 MED ORDER — HYDROCODONE-ACETAMINOPHEN 5-325 MG PO TABS: 1.0000 | ORAL_TABLET | ORAL | Status: DC | PRN

## 2021-04-25 MED ORDER — MENTHOL 3 MG MT LOZG: 1.0000 | LOZENGE | OROMUCOSAL | Status: DC | PRN

## 2021-04-25 MED ORDER — ONDANSETRON HCL 4 MG PO TABS: 4.0000 mg | ORAL_TABLET | Freq: Four times a day (QID) | ORAL | Status: DC | PRN

## 2021-04-25 MED ORDER — HEMOSTATIC AGENTS (NO CHARGE) OPTIME
TOPICAL | Status: DC | PRN
  Administered 2021-04-25: 1 via TOPICAL

## 2021-04-25 MED ORDER — MORPHINE SULFATE (PF) 2 MG/ML IV SOLN
2.0000 mg | INTRAVENOUS | Status: DC | PRN
  Administered 2021-04-25: 2 mg via INTRAVENOUS
  Filled 2021-04-25: qty 1

## 2021-04-25 MED ORDER — CHLORHEXIDINE GLUCONATE 0.12 % MT SOLN
15.0000 mL | Freq: Once | OROMUCOSAL | Status: AC
  Administered 2021-04-25: 15 mL via OROMUCOSAL
  Filled 2021-04-25: qty 15

## 2021-04-25 MED ORDER — LACTATED RINGERS IV SOLN: INTRAVENOUS | Status: DC

## 2021-04-25 MED ORDER — METHOCARBAMOL 500 MG PO TABS
500.0000 mg | ORAL_TABLET | Freq: Four times a day (QID) | ORAL | Status: DC | PRN
  Administered 2021-04-25 – 2021-04-26 (×3): 500 mg via ORAL
  Filled 2021-04-25 (×3): qty 1

## 2021-04-25 MED ORDER — BISACODYL 10 MG RE SUPP: 10.0000 mg | Freq: Every day | RECTAL | Status: DC | PRN

## 2021-04-25 MED ORDER — PANTOPRAZOLE SODIUM 40 MG PO TBEC
40.0000 mg | DELAYED_RELEASE_TABLET | Freq: Every day | ORAL | Status: DC
  Administered 2021-04-26: 40 mg via ORAL
  Filled 2021-04-25: qty 1

## 2021-04-25 MED ORDER — HYDROCHLOROTHIAZIDE 12.5 MG PO TABS: 12.5000 mg | ORAL_TABLET | Freq: Every day | ORAL | Status: DC

## 2021-04-25 MED ORDER — ORAL CARE MOUTH RINSE: 15.0000 mL | Freq: Once | OROMUCOSAL | Status: AC

## 2021-04-25 MED ORDER — HYDROMORPHONE HCL 1 MG/ML IJ SOLN
0.2500 mg | INTRAMUSCULAR | Status: DC | PRN
  Administered 2021-04-25: 0.5 mg via INTRAVENOUS
  Administered 2021-04-25: 0.25 mg via INTRAVENOUS
  Administered 2021-04-25: 0.5 mg via INTRAVENOUS
  Administered 2021-04-25: 0.25 mg via INTRAVENOUS

## 2021-04-25 MED ORDER — LACTATED RINGERS IV SOLN: INTRAVENOUS | Status: DC | PRN

## 2021-04-25 MED ORDER — CEFAZOLIN SODIUM-DEXTROSE 2-4 GM/100ML-% IV SOLN
2.0000 g | Freq: Three times a day (TID) | INTRAVENOUS | Status: AC
  Administered 2021-04-25 – 2021-04-26 (×2): 2 g via INTRAVENOUS
  Filled 2021-04-25 (×2): qty 100

## 2021-04-25 MED ORDER — PHENYLEPHRINE HCL-NACL 20-0.9 MG/250ML-% IV SOLN
INTRAVENOUS | Status: DC | PRN
  Administered 2021-04-25: 40 ug/min via INTRAVENOUS

## 2021-04-25 MED ORDER — PHENYLEPHRINE 40 MCG/ML (10ML) SYRINGE FOR IV PUSH (FOR BLOOD PRESSURE SUPPORT)
PREFILLED_SYRINGE | INTRAVENOUS | Status: DC | PRN
  Administered 2021-04-25: 80 ug via INTRAVENOUS
  Administered 2021-04-25: 40 ug via INTRAVENOUS
  Administered 2021-04-25: 80 ug via INTRAVENOUS
  Administered 2021-04-25: 40 ug via INTRAVENOUS
  Administered 2021-04-25: 80 ug via INTRAVENOUS
  Administered 2021-04-25: 40 ug via INTRAVENOUS

## 2021-04-25 MED ORDER — AMOXICILLIN 500 MG PO CAPS: 2000.0000 mg | ORAL_CAPSULE | Freq: Every day | ORAL | Status: DC

## 2021-04-25 MED ORDER — PROPOFOL 10 MG/ML IV BOLUS
INTRAVENOUS | Status: AC
  Filled 2021-04-25: qty 20

## 2021-04-25 MED ORDER — DEXAMETHASONE SODIUM PHOSPHATE 10 MG/ML IJ SOLN
INTRAMUSCULAR | Status: DC | PRN
Start: 2021-04-25 — End: 2021-04-25
  Administered 2021-04-25: 10 mg via INTRAVENOUS

## 2021-04-25 MED ORDER — LIDOCAINE 2% (20 MG/ML) 5 ML SYRINGE
INTRAMUSCULAR | Status: DC | PRN
  Administered 2021-04-25: 80 mg via INTRAVENOUS

## 2021-04-25 MED ORDER — 0.9 % SODIUM CHLORIDE (POUR BTL) OPTIME
TOPICAL | Status: DC | PRN
  Administered 2021-04-25: 1000 mL

## 2021-04-25 MED ORDER — MECLIZINE HCL 25 MG PO TABS: 25.0000 mg | ORAL_TABLET | Freq: Two times a day (BID) | ORAL | Status: DC | PRN

## 2021-04-25 MED ORDER — SENNA 8.6 MG PO TABS
1.0000 | ORAL_TABLET | Freq: Two times a day (BID) | ORAL | Status: DC
  Administered 2021-04-25 (×2): 8.6 mg via ORAL
  Filled 2021-04-25 (×2): qty 1

## 2021-04-25 MED ORDER — TRAMADOL HCL 50 MG PO TABS
50.0000 mg | ORAL_TABLET | Freq: Four times a day (QID) | ORAL | Status: DC | PRN
  Administered 2021-04-25: 100 mg via ORAL
  Filled 2021-04-25: qty 2

## 2021-04-25 MED ORDER — ACETAMINOPHEN 325 MG PO TABS: 650.0000 mg | ORAL_TABLET | ORAL | Status: DC | PRN

## 2021-04-25 MED ORDER — PRAMIPEXOLE DIHYDROCHLORIDE 0.25 MG PO TABS
0.1250 mg | ORAL_TABLET | Freq: Three times a day (TID) | ORAL | Status: DC
  Administered 2021-04-25 – 2021-04-26 (×3): 0.125 mg via ORAL
  Filled 2021-04-25 (×3): qty 1

## 2021-04-25 SURGICAL SUPPLY — 57 items
ADH SKN CLS APL DERMABOND .7 (GAUZE/BANDAGES/DRESSINGS) ×1
BAG COUNTER SPONGE SURGICOUNT (BAG) ×4 IMPLANT
BAG SPNG CNTER NS LX DISP (BAG) ×2
BAND INSRT 18 STRL LF DISP RB (MISCELLANEOUS) ×2
BAND RUBBER #18 3X1/16 STRL (MISCELLANEOUS) ×4 IMPLANT
BLADE CLIPPER SURG (BLADE) ×2 IMPLANT
BLADE SURG 15 STRL LF DISP TIS (BLADE) ×1 IMPLANT
BLADE SURG 15 STRL SS (BLADE) ×2
BUR 2.5 MTCH HD 16 (BUR) ×2 IMPLANT
BUR PRECISION MATCH 2.5 (BURR) ×2 IMPLANT
BUR PRECISION MATCH 3.0 13 (BURR) IMPLANT
CANISTER SUCT 3000ML PPV (MISCELLANEOUS) ×2 IMPLANT
CARTRIDGE OIL MAESTRO DRILL (MISCELLANEOUS) IMPLANT
COVER MAYO STAND STRL (DRAPES) IMPLANT
DECANTER SPIKE VIAL GLASS SM (MISCELLANEOUS) ×2 IMPLANT
DERMABOND ADVANCED (GAUZE/BANDAGES/DRESSINGS) ×1
DERMABOND ADVANCED .7 DNX12 (GAUZE/BANDAGES/DRESSINGS) ×1 IMPLANT
DIFFUSER DRILL AIR PNEUMATIC (MISCELLANEOUS) IMPLANT
DRAPE C-ARM 42X72 X-RAY (DRAPES) ×4 IMPLANT
DRAPE LAPAROTOMY 100X72 PEDS (DRAPES) ×2 IMPLANT
DRAPE MICROSCOPE LEICA (MISCELLANEOUS) ×2 IMPLANT
DURAPREP 6ML APPLICATOR 50/CS (WOUND CARE) ×2 IMPLANT
ELECT BLADE 4.0 EZ CLEAN MEGAD (MISCELLANEOUS) ×2
ELECT REM PT RETURN 9FT ADLT (ELECTROSURGICAL) ×2
ELECTRODE BLDE 4.0 EZ CLN MEGD (MISCELLANEOUS) ×1 IMPLANT
ELECTRODE REM PT RTRN 9FT ADLT (ELECTROSURGICAL) ×1 IMPLANT
GAUZE 4X4 16PLY ~~LOC~~+RFID DBL (SPONGE) ×2 IMPLANT
GLOVE EXAM NITRILE XL STR (GLOVE) IMPLANT
GLOVE SURG LTX SZ8.5 (GLOVE) ×2 IMPLANT
GLOVE SURG UNDER POLY LF SZ7.5 (GLOVE) ×6 IMPLANT
GLOVE SURG UNDER POLY LF SZ8.5 (GLOVE) ×2 IMPLANT
GOWN STRL REUS W/ TWL LRG LVL3 (GOWN DISPOSABLE) ×3 IMPLANT
GOWN STRL REUS W/ TWL XL LVL3 (GOWN DISPOSABLE) IMPLANT
GOWN STRL REUS W/TWL 2XL LVL3 (GOWN DISPOSABLE) ×2 IMPLANT
GOWN STRL REUS W/TWL LRG LVL3 (GOWN DISPOSABLE) ×6
GOWN STRL REUS W/TWL XL LVL3 (GOWN DISPOSABLE)
HEMOSTAT POWDER KIT SURGIFOAM (HEMOSTASIS) ×2 IMPLANT
KIT BASIN OR (CUSTOM PROCEDURE TRAY) ×2 IMPLANT
KIT TURNOVER KIT B (KITS) ×2 IMPLANT
NEEDLE HYPO 18GX1.5 BLUNT FILL (NEEDLE) IMPLANT
NEEDLE HYPO 22GX1.5 SAFETY (NEEDLE) ×2 IMPLANT
NEEDLE SPNL 20GX3.5 QUINCKE YW (NEEDLE) IMPLANT
NS IRRIG 1000ML POUR BTL (IV SOLUTION) ×2 IMPLANT
OIL CARTRIDGE MAESTRO DRILL (MISCELLANEOUS)
PACK LAMINECTOMY NEURO (CUSTOM PROCEDURE TRAY) ×2 IMPLANT
PAD ARMBOARD 7.5X6 YLW CONV (MISCELLANEOUS) IMPLANT
PIN MAYFIELD SKULL DISP (PIN) ×2 IMPLANT
PIN SKULL DORO LUCENT (PIN) ×1 IMPLANT
PINS SKULL DORO LUCENT (PIN) ×2
SPONGE SURGIFOAM ABS GEL SZ50 (HEMOSTASIS) ×2 IMPLANT
SPONGE T-LAP 4X18 ~~LOC~~+RFID (SPONGE) ×4 IMPLANT
SUT VIC AB 3-0 SH 8-18 (SUTURE) ×2 IMPLANT
SUT VIC AB 4-0 RB1 18 (SUTURE) ×2 IMPLANT
SYR 5ML LL (SYRINGE) IMPLANT
TOWEL GREEN STERILE (TOWEL DISPOSABLE) ×2 IMPLANT
TOWEL GREEN STERILE FF (TOWEL DISPOSABLE) ×2 IMPLANT
WATER STERILE IRR 1000ML POUR (IV SOLUTION) ×2 IMPLANT

## 2021-04-25 NOTE — Anesthesia Procedure Notes (Signed)
Procedure Name: Intubation Date/Time: 04/25/2021 8:12 AM Performed by: Jenkins Rouge, RN Pre-anesthesia Checklist: Patient identified, Emergency Drugs available, Suction available and Patient being monitored Patient Re-evaluated:Patient Re-evaluated prior to induction Oxygen Delivery Method: Circle system utilized Preoxygenation: Pre-oxygenation with 100% oxygen Induction Type: IV induction Ventilation: Mask ventilation without difficulty Laryngoscope Size: Glidescope and 3 Grade View: Grade I Tube type: Oral Number of attempts: 1 Airway Equipment and Method: Stylet Placement Confirmation: ETT inserted through vocal cords under direct vision, positive ETCO2 and breath sounds checked- equal and bilateral Secured at: 22 cm Tube secured with: Tape Dental Injury: Teeth and Oropharynx as per pre-operative assessment  Comments: Head and neck maintained neutral throughout induction and intubation

## 2021-04-25 NOTE — Interval H&P Note (Signed)
History and Physical Interval Note:  04/25/2021 8:00 AM  [name]  has presented today for surgery, with the diagnosis of Left Cervical radiculopathy.  The various methods of treatment have been discussed with the patient and family. After consideration of risks, benefits and other options for treatment, the patient has consented to  Procedure(s) with comments: Cervical 7-Thoracic 1; Thoracic 1-2 Left Laminectomy/Foraminotomy (Left) - 3C/RM 18 as a surgical intervention.  The patient's history has been reviewed, patient examined, no change in status, stable for surgery.  I have reviewed the patient's chart and labs.  Questions were answered to the patient's satisfaction.     [name]

## 2021-04-25 NOTE — Transfer of Care (Signed)
Immediate Anesthesia Transfer of Care Note  Patient: [name]  Procedure(s) Performed: Cervical seven-Thoracic one; Thoracic one-two Left Laminectomy/Foraminotomy with metrix (Left: Back)  Patient Location: PACU  Anesthesia Type:General  Level of Consciousness: drowsy and patient cooperative  Airway & Oxygen Therapy: Patient Spontanous Breathing and Patient connected to face mask  Post-op Assessment: Report given to RN and Post -op Vital signs reviewed and stable  Post vital signs: Reviewed and stable  Last Vitals:  Vitals Value Taken Time  BP 137/58 04/25/21 1100  Temp 36.7 C 04/25/21 1100  Pulse 102 04/25/21 1104  Resp 15 04/25/21 1104  SpO2 100 % 04/25/21 1104  Vitals shown include unvalidated device data.  Last Pain:  Vitals:   04/25/21 0620  TempSrc:   PainSc: 7          Complications: No notable events documented.

## 2021-04-25 NOTE — Anesthesia Preprocedure Evaluation (Addendum)
Anesthesia Evaluation  Patient identified by MRN, date of birth, ID band Patient awake    Reviewed: Allergy & Precautions, NPO status , Patient's Chart, lab work & pertinent test results  Airway Mallampati: II  TM Distance: >3 FB Neck ROM: Limited    Dental no notable dental hx. (+) Teeth Intact, Dental Advisory Given   Pulmonary neg pulmonary ROS,    Pulmonary exam normal breath sounds clear to auscultation       Cardiovascular hypertension, Pt. on medications Normal cardiovascular exam+ dysrhythmias  Rhythm:Regular Rate:Normal  Exercise treadmill stress test 03/08/2021: Exercise treadmill stress test performed using Bruce protocol. Patient reached 6.7 METS, and 101% of age predicted maximum heart rate. Exercise capacity was low. No chest pain reported, dyspnea and dizziness reported. Normal heart rate and hemodynamic response. Stress EKG revealed no ischemic changes. Intermediate risk study due to low exercise capacity.   TTE 05/03/2020 (copy on chart): Left ventricle: LV chamber and wall dimensions are normal.  LV chamber size is normal.  LV wall thickness is normal.  LV systolic function is normal.  There are no wall motion abnormalities observed.  The calculated LV ejection fraction is 64.22%.  There is impaired left ventricular relaxation. Right ventricle: There is normal right ventricular size, wall dimension, and systolic function. Left atrium: Left atrium chamber is mildly dilated.  LA diameter is 4.24 cm.  The LA volume index is 42.29 mL/m. Right atrium: RA chamber size is normal. Aortic valve: There is a trileaflet aortic valve.  The aortic valve is mildly calcified.  There is no aortic regurgitation.  There is mild aortic valve stenosis. Mitral valve: There is normal mitral valve structure and function.  There is no mitral valve stenosis or regurgitation. Tricuspid valve: There is normal tricuspid valve structure and  function.  There is trace tricuspid regurgitation. Pulmonary valve: There is normal pulmonic valve structure and function.  There is no pulmonic valve stenosis or regurgitation. Pericardium: The pericardium is normal. Aorta: The size of the visualized portion of the aortic root is within normal limits. Venous: The inferior vena cava dimension is normal.   Neuro/Psych PSYCHIATRIC DISORDERS Anxiety negative neurological ROS     GI/Hepatic Neg liver ROS, GERD  Controlled and Medicated,  Endo/Other  Hypothyroidism   Renal/GU negative Renal ROS  negative genitourinary   Musculoskeletal  (+) Arthritis , Fibromyalgia -  Abdominal   Peds  Hematology negative hematology ROS (+)   Anesthesia Other Findings   Reproductive/Obstetrics                            Anesthesia Physical Anesthesia Plan  ASA: 3  Anesthesia Plan: General   Post-op Pain Management:    Induction: Intravenous  PONV Risk Score and Plan: 3 and Midazolam, Dexamethasone and Ondansetron  Airway Management Planned: Oral ETT and Video Laryngoscope Planned  Additional Equipment:   Intra-op Plan:   Post-operative Plan: Extubation in OR  Informed Consent: I have reviewed the patients History and Physical, chart, labs and discussed the procedure including the risks, benefits and alternatives for the proposed anesthesia with the patient or authorized representative who has indicated his/her understanding and acceptance.     Dental advisory given  Plan Discussed with: CRNA  Anesthesia Plan Comments:         Anesthesia Quick Evaluation

## 2021-04-25 NOTE — Op Note (Signed)
Date of surgery: 04/25/2021 Preoperative diagnosis: Cervical radiculopathy C8 and T1 on the left Postoperative diagnosis: Same Procedure: Laminotomies and foraminotomies C7-T1 and T1-T2 on the left with Metrix retractor operating microscope microdissection technique Surgeon: Barnett Abu First Assistant: Coletta Memos MD Anesthesia: General endotracheal Indications: Patient is a 65 year old individual whose had extensive cervical decompression in the past from C3 down to C7.  She has developed significant spondylitic degeneration at the C7-T1 level and T1-T2 with foraminal stenosis on the left side particularly creating left cervical radiculopathy in the C8 and T1 distributions.  Has been advised regarding surgical decompression via a posterior approach using a Metrix retractor and the operating microscope.  Procedure: Patient was brought to the operating room supine on the stretcher.  After the smooth induction of general endotracheal anesthesia she was carefully placed in the 3 pin headrest and turned prone onto the operating table.  The headrest was secured in the frame and the back of the neck and shoulders were taped down to allow appropriate exposure at the cervical thoracic junction.  Fluoroscopy was used to localize the C7-T1 level on the left side and the T1-T2 level also on the left side the skin was marked in the area was prepped with alcohol DuraPrep and draped in a sterile fashion.  Small vertical incision was made between these 2 areas and the skin was infiltrated with lidocaine and epinephrine.  A K wire was then passed to the laminar arch of T1 and fluoroscopic guidance was used to localize this onto the facet surface at the C7-T1 level.  Then a winding technique was used to pass a series of dilators and opened this area to a 20 mm size.  An 18 mm x 6 sonometer Metrix retractor tube was then inserted over the dilators and secured to the operating table with a clamp.  Fluoroscopic visualization  identified position of the retractor over the C7-T1 level.  The operating microscope was then used for further magnification visualization of the laminar facet junction at C7-T1 laminotomy was then created removing the inferior margin lamina of C7 out to and including the mesial third of the facet joint the superior articular process of T1 was also removed this allowed good visualization of the common dural tube and the takeoff of the T1 nerve root which appeared to be entering the foramen from inferiorly.  It was carefully protected and dissection was then carried out laterally to expose the foramen which was noted to be significantly overgrown was facet material and facet capsular material this was drilled down and carefully removed using a 1 and 2 mm Kerrison punch until the path of the T1 nerve root could be sounded free and clear in the lateral aspect of the foramen once this was accomplished hemostasis was achieved using some pledgets of Gelfoam and also Surgifoam with cottonoid padding.  Once hemostasis was achieved the cottonoids were removed and attention was then turned to T1-T2 this was achieved by redirecting the Metrix tube over the T1-T2 area and securing it with the surgical clamp.  A laminotomy was then created over T1-T2 and the foramen was then opened again identifying significant facet joint capsular material that was causing lateral recess stenosis for the left-sided T1 nerve root once this area was decompressed hemostasis was achieved in a similar fashion and then carefully the Metrix tube was removed checking hemostasis.  Once hemostasis was well achieved and additional 20 cc of half percent Marcaine was injected into the paraspinous muscles and fascia and  then the fascia was closed with a singular 3-0 Vicryl suture and 3-0 Vicryl was used in the subcuticular area to close the skin.  Dermabond was placed on the skin.  Blood loss was estimated about 100 cc.  Patient tolerated procedure well is  returned to recovery room in stable condition.

## 2021-04-26 ENCOUNTER — Encounter (HOSPITAL_COMMUNITY): Payer: Self-pay | Admitting: Neurological Surgery

## 2021-04-26 DIAGNOSIS — M5412 Radiculopathy, cervical region: Secondary | ICD-10-CM | POA: Diagnosis not present

## 2021-04-26 MED ORDER — HYDROCODONE-ACETAMINOPHEN 5-325 MG PO TABS
1.0000 | ORAL_TABLET | ORAL | 0 refills | Status: AC | PRN
Start: 2021-04-26 — End: ?

## 2021-04-26 MED ORDER — METHOCARBAMOL 500 MG PO TABS: 500.0000 mg | ORAL_TABLET | Freq: Four times a day (QID) | ORAL | 3 refills | Status: AC | PRN

## 2021-04-26 NOTE — Evaluation (Signed)
Occupational Therapy Evaluation Patient Details Name: [name] MRN: [id number] DOB: Jan 22, 1956 Today's Date: 04/26/2021   History of Present Illness Pt is a 65 y/o female s/p C7-T2 posterior cervical laminectomy. PMH includes HTN, fibromyalgia, back surgery, and bilateral TKA.   Clinical Impression   PTA, pt independent with ADLs and functional mobility. Pt lives with spouse and will have his assistance at d/c. Pt lives in single story home with 2 steps to enter; walk in shower, elevated toilet seat, and shower chair. Currently pt min guard for transfers and UE dressing, intermittently uses bed rails/counter to steady self during session, pt stating she feels mild weakness and rates pain 6/10 at surgical site. Educated pt on cervical precautions as well as compensatory strategies for LE dressing and shower transfers. Pt given booklet, able to verbalize understanding of precautions and compensatory methods. Pt has no acute OT needs, will s/o. Recommend d/c home with spouse.     Recommendations for follow up therapy are one component of a multi-disciplinary discharge planning process, led by the attending physician.  Recommendations may be updated based on patient status, additional functional criteria and insurance authorization.   Follow Up Recommendations  No OT follow up    Assistance Recommended at Discharge Intermittent Supervision/Assistance  Functional Status Assessment  Patient has had a recent decline in their functional status and demonstrates the ability to make significant improvements in function in a reasonable and predictable amount of time.  Equipment Recommendations  None recommended by OT    Recommendations for Other Services PT consult     Precautions / Restrictions Precautions Precautions: Cervical Precaution Booklet Issued: Yes (comment) Precaution Comments: pt verbalized cervical precautions from booklet issued Required Braces or Orthoses:   (None) Restrictions Weight Bearing Restrictions: No Other Position/Activity Restrictions: cervical precautions      Mobility Bed Mobility Overal bed mobility: Needs Assistance Bed Mobility: Sit to Supine Rolling: Min guard Sidelying to sit: Min guard   Sit to supine: Min guard Sit to sidelying: Min guard General bed mobility comments: required v/cing for long roll technique    Transfers Overall transfer level: Needs assistance Equipment used: None Transfers: Sit to/from Stand Sit to Stand: Min guard           General transfer comment: observed pt occasionally using bedrails to stand/holding onto walls/counter when navigating space      Balance Overall balance assessment: Mild deficits observed, not formally tested                                         ADL either performed or assessed with clinical judgement   ADL Overall ADL's : Needs assistance/impaired Eating/Feeding: Independent;Sitting   Grooming: Oral care;Min guard;Standing   Upper Body Bathing: Minimal assistance;Sitting   Lower Body Bathing: Minimal assistance;Sitting/lateral leans   Upper Body Dressing : Min guard;Sitting Upper Body Dressing Details (indicate cue type and reason): donned gown while adhering to cervical precautions Lower Body Dressing: Minimal assistance;Sit to/from stand   Toilet Transfer: Min guard   Toileting- Architect and Hygiene: Min guard         General ADL Comments: Pt able to complete oral care adhering cervical precautions, mod I for UE dress, simulated LE dressing using figure 4 method, pt simulated toilet transfer with min guard.     Vision   Vision Assessment?: No apparent visual deficits     Perception  Praxis      Pertinent Vitals/Pain Pain Assessment: Faces Pain Score: 6  Faces Pain Scale: Hurts even more Pain Location: neck Pain Descriptors / Indicators: Discomfort;Constant Pain Intervention(s): Limited activity  within patient's tolerance;Repositioned     Hand Dominance     Extremity/Trunk Assessment Upper Extremity Assessment Upper Extremity Assessment: Overall WFL for tasks assessed LUE Deficits / Details: Reports numbness in L pinky and ring finger   Lower Extremity Assessment Lower Extremity Assessment: Defer to PT evaluation   Cervical / Trunk Assessment Cervical / Trunk Assessment: Neck Surgery   Communication Communication Communication: No difficulties   Cognition Arousal/Alertness: Awake/alert Behavior During Therapy: WFL for tasks assessed/performed Overall Cognitive Status: Within Functional Limits for tasks assessed                                       General Comments  educated pt on compensatory strategies for UE/LE dressing, shower transfer, toileting, bed mobility, and grooming tasks.    Exercises     Shoulder Instructions      Home Living Family/patient expects to be discharged to:: Private residence Living Arrangements: Spouse/significant other Available Help at Discharge: Family;Available 24 hours/day Type of Home: House Home Access: Stairs to enter [employer] of Steps: 2 Entrance Stairs-Rails: Right Home Layout: One level     Bathroom Shower/Tub: Producer, television/film/video: Handicapped height Bathroom Accessibility: No   Home Equipment: Grab bars - toilet;Grab bars - tub/shower;Shower Counsellor (2 wheels)   Additional Comments: pt has shower seat      Prior Functioning/Environment Prior Level of Function : Independent/Modified Independent                        OT Problem List: Pain;Decreased range of motion      OT Treatment/Interventions:      OT Goals(Current goals can be found in the care plan section) Acute Rehab OT Goals Patient Stated Goal: return home OT Goal Formulation: With patient Time For Goal Achievement: 05/10/21 Potential to Achieve Goals: Good  OT Frequency:      Barriers to D/C:            Co-evaluation              AM-PAC OT "6 Clicks" Daily Activity     Outcome Measure Help from another person eating meals?: None Help from another person taking care of personal grooming?: None Help from another person toileting, which includes using toliet, bedpan, or urinal?: A Little Help from another person bathing (including washing, rinsing, drying)?: A Little Help from another person to put on and taking off regular upper body clothing?: A Little Help from another person to put on and taking off regular lower body clothing?: A Little 6 Click Score: 20   End of Session    Activity Tolerance: Patient tolerated treatment well Patient left: in bed;with call bell/phone within reach (sitting EOB eating breakfast)  OT Visit Diagnosis: Unsteadiness on feet (R26.81);Other abnormalities of gait and mobility (R26.89);Pain                Time: 9983-3825 OT Time Calculation (min): 14 min Charges:  OT General Charges $OT Visit: 1 Visit OT Evaluation $OT Eval Low Complexity: 1 Low  Alfonzo Beers, OTD, OTR/L Acute Rehab [phone]   Mayer Masker 04/26/2021, 9:24 AM

## 2021-04-26 NOTE — Progress Notes (Signed)
Patient alert and oriented, mae's well, voiding adequate amount of urine, swallowing without difficulty, no c/o pain at time of discharge. Patient discharged home with family. Script and discharged instructions given to patient. Patient and family stated understanding of instructions given. Patient has an appointment with Dr. Elsner  

## 2021-04-26 NOTE — Evaluation (Signed)
Physical Therapy Evaluation Patient Details Name: [name] MRN: [id number] DOB: [birth year] Today's Date: 04/26/2021  History of Present Illness  Pt is a 65 y/o female s/p C7-T2 posterior cervical laminectomy. PMH includes HTN, fibromyalgia, back surgery, and bilateral TKA.  Clinical Impression  Patient is s/p above surgery resulting in the deficits listed below (see PT Problem List). Pt with mild unsteadiness. Min guard for safety to ambulate with RW and perform stair navigation. Educated about neck precautions and generalized walking program. Patient will benefit from skilled PT to increase their independence and safety with mobility (while adhering to their precautions) to allow discharge to the venue listed below.        Recommendations for follow up therapy are one component of a multi-disciplinary discharge planning process, led by the attending physician.  Recommendations may be updated based on patient status, additional functional criteria and insurance authorization.  Follow Up Recommendations No PT follow up    Assistance Recommended at Discharge Intermittent Supervision/Assistance  Functional Status Assessment Patient has had a recent decline in their functional status and demonstrates the ability to make significant improvements in function in a reasonable and predictable amount of time.  Equipment Recommendations  None recommended by PT    Recommendations for Other Services       Precautions / Restrictions Precautions Precautions: Cervical Precaution Booklet Issued: Yes (comment) Precaution Comments: REviewed cervical precautions. Restrictions Weight Bearing Restrictions: No      Mobility  Bed Mobility Overal bed mobility: Needs Assistance Bed Mobility: Rolling;Sidelying to Sit;Sit to Sidelying Rolling: Min guard Sidelying to sit: Min guard     Sit to sidelying: Min guard General bed mobility comments: Min guard for safety. Cues for log roll technique     Transfers Overall transfer level: Needs assistance Equipment used: Rolling walker (2 wheels) Transfers: Sit to/from Stand Sit to Stand: Min guard           General transfer comment: Min guard for safety    Ambulation/Gait Ambulation/Gait assistance: Min guard Gait Distance (Feet): 200 Feet Assistive device: Rolling walker (2 wheels) Gait Pattern/deviations: Step-through pattern;Decreased stride length Gait velocity: Decreased   General Gait Details: Cues for upright posture. Min guard for safety. Pt reporting incresaed pain in neck which limited ambulation tolerance.  Stairs Stairs: Yes Stairs assistance: Min guard Stair Management: One rail Right;Step to pattern;Forwards Number of Stairs: 1 General stair comments: Min guard for safety. Cues for sequencing.  Wheelchair Mobility    Modified Rankin (Stroke Patients Only)       Balance Overall balance assessment: Mild deficits observed, not formally tested                                           Pertinent Vitals/Pain Pain Assessment: Faces Faces Pain Scale: Hurts even more Pain Location: neck Pain Descriptors / Indicators: Grimacing;Guarding Pain Intervention(s): Limited activity within patient's tolerance;Monitored during session;Repositioned    Home Living Family/patient expects to be discharged to:: Private residence Living Arrangements: Spouse/significant other Available Help at Discharge: Family;Available 24 hours/day Type of Home: House Home Access: Stairs to enter Entrance Stairs-Rails: Right Entrance Stairs-Number of Steps: 2   Home Layout: One level Home Equipment: Grab bars - toilet;Grab bars - tub/shower;Shower seat;Rolling Environmental consultant (2 wheels)      Prior Function Prior Level of Function : Independent/Modified Independent  Hand Dominance        Extremity/Trunk Assessment   Upper Extremity Assessment Upper Extremity Assessment: Defer to OT  evaluation;LUE deficits/detail LUE Deficits / Details: Reports numbness in L pinky and ring finger    Lower Extremity Assessment Lower Extremity Assessment: Generalized weakness    Cervical / Trunk Assessment Cervical / Trunk Assessment: Neck Surgery  Communication   Communication: No difficulties  Cognition Arousal/Alertness: Awake/alert Behavior During Therapy: WFL for tasks assessed/performed Overall Cognitive Status: Within Functional Limits for tasks assessed                                          General Comments General comments (skin integrity, edema, etc.): Educated about walking program to perform at home    Exercises     Assessment/Plan    PT Assessment Patient needs continued PT services  PT Problem List Decreased strength;Decreased activity tolerance;Decreased balance;Decreased mobility;Decreased knowledge of use of DME;Decreased knowledge of precautions;Pain       PT Treatment Interventions DME instruction;Gait training;Stair training;Functional mobility training;Therapeutic activities;Therapeutic exercise;Balance training;Patient/family education    PT Goals (Current goals can be found in the Care Plan section)  Acute Rehab PT Goals Patient Stated Goal: to go home PT Goal Formulation: With patient Time For Goal Achievement: 05/10/21 Potential to Achieve Goals: Good    Frequency Min 5X/week   Barriers to discharge        Co-evaluation               AM-PAC PT "6 Clicks" Mobility  Outcome Measure Help needed turning from your back to your side while in a flat bed without using bedrails?: A Little Help needed moving from lying on your back to sitting on the side of a flat bed without using bedrails?: A Little Help needed moving to and from a bed to a chair (including a wheelchair)?: A Little Help needed standing up from a chair using your arms (e.g., wheelchair or bedside chair)?: A Little Help needed to walk in hospital room?:  A Little Help needed climbing 3-5 steps with a railing? : A Little 6 Click Score: 18    End of Session Equipment Utilized During Treatment: Gait belt Activity Tolerance: Patient tolerated treatment well Patient left: in bed;with call bell/phone within reach Nurse Communication: Mobility status;Other (comment) (surgery site swelling) PT Visit Diagnosis: Unsteadiness on feet (R26.81);Muscle weakness (generalized) (M62.81)    Time: 5974-1638 PT Time Calculation (min) (ACUTE ONLY): 16 min   Charges:   PT Evaluation $PT Eval Low Complexity: 1 Low          Cindee Salt, DPT  Acute Rehabilitation Services  Pager: [phone] Office: [phone]   Lehman Prom 04/26/2021, 7:17 AM

## 2021-04-26 NOTE — Discharge Summary (Signed)
Physician Discharge Summary  Patient ID: [name] MRN: [id number] DOB/AGE: 1955-09-14 65 y.o.  Admit date: 04/25/2021 Discharge date: 04/26/2021  Admission Diagnoses: Left cervical radiculopathy C8-T1  Discharge Diagnoses: Left cervical radiculopathy C8 and T1 Active Problems:   Cervical radicular pain   Discharged Condition: good  Hospital Course: Patient was admitted to undergo surgical decompression at the C7-T1 level and T1-T2 level on the left side.  She has had cervical radiculopathy in this distribution secondary to the severe spondylitic overgrowth.  She tolerated surgery well.  Consults: None  Significant Diagnostic Studies: None  Treatments: surgery: See op note  Discharge Exam: Blood pressure (!) 118/49, pulse 71, temperature (!) 97.5 F (36.4 C), temperature source Oral, resp. rate 18, height 5\' 2"  (1.575 m), weight 95.3 kg, SpO2 98 %. Incision is clean and dry motor function is intact.  Disposition: Discharge disposition: 01-Home or Self Care       Discharge Instructions     Call MD for:  redness, tenderness, or signs of infection (pain, swelling, redness, odor or green/yellow discharge around incision site)   Complete by: As directed    Call MD for:  severe uncontrolled pain   Complete by: As directed    Call MD for:  temperature >100.4   Complete by: As directed    Diet - low sodium heart healthy   Complete by: As directed    Discharge wound care:   Complete by: As directed    Okay to shower. Do not apply salves or appointments to incision. No heavy lifting with the upper extremities greater than 10 pounds. May resume driving when not requiring pain medication and patient feels comfortable with doing so.   Incentive spirometry RT   Complete by: As directed    Increase activity slowly   Complete by: As directed       Allergies as of 04/26/2021       Reactions   Prednisone Other (See Comments)   Chest pain   Clindamycin/lincomycin Rash    Influenza Vaccines Hives   Lodine [etodolac] Rash        Medication List     TAKE these medications    acetaminophen 650 MG CR tablet Commonly known as: TYLENOL Take 1,300 mg by mouth every 8 (eight) hours as needed for pain.   ALPRAZolam 0.25 MG tablet Commonly known as: XANAX Take 0.25 mg by mouth at bedtime as needed for sleep.   amoxicillin 500 MG capsule Commonly known as: AMOXIL Take 2,000 mg by mouth daily.   buPROPion 150 MG 24 hr tablet Commonly known as: WELLBUTRIN XL Take 450 mg by mouth daily.   cetirizine 10 MG tablet Commonly known as: ZYRTEC Take 10 mg by mouth daily.   gabapentin 300 MG capsule Commonly known as: NEURONTIN Take 300 mg by mouth 2 (two) times daily.   HYDROcodone-acetaminophen 5-325 MG tablet Commonly known as: NORCO/VICODIN Take 1-2 tablets by mouth every 4 (four) hours as needed for severe pain.   hydroxychloroquine 200 MG tablet Commonly known as: PLAQUENIL Take 200 mg by mouth 2 (two) times daily.   ibuprofen 600 MG tablet Commonly known as: ADVIL Take 600 mg by mouth 3 (three) times daily as needed for pain, moderate pain or mild pain.   leflunomide 20 MG tablet Commonly known as: ARAVA Take 20 mg by mouth at bedtime.   levothyroxine 200 MCG tablet Commonly known as: SYNTHROID Take 200 mcg by mouth See admin instructions. Take 200 mcg at bedtime except on  Saturday take 100 mcg. Skip dose on Sundays.   losartan-hydrochlorothiazide 100-12.5 MG tablet Commonly known as: HYZAAR Take 1 tablet by mouth every morning.   magnesium oxide 400 MG tablet Commonly known as: MAG-OX Take 400 mg by mouth daily.   meclizine 25 MG tablet Commonly known as: ANTIVERT Take 25 mg by mouth 2 (two) times daily as needed for dizziness.   meloxicam 15 MG tablet Commonly known as: MOBIC Take 15 mg by mouth daily.   methocarbamol 500 MG tablet Commonly known as: ROBAXIN Take 1 tablet (500 mg total) by mouth every 6 (six) hours as needed  for muscle spasms.   omeprazole 20 MG capsule Commonly known as: PRILOSEC Take 20 mg by mouth daily.   pramipexole 0.125 MG tablet Commonly known as: MIRAPEX Take 0.125 mg by mouth 3 (three) times daily.   senna 8.6 MG tablet Commonly known as: SENOKOT Take 1 tablet by mouth daily. All vegatable   tiZANidine 4 MG tablet Commonly known as: ZANAFLEX Take 4 mg by mouth 3 (three) times daily as needed.   traMADol 50 MG tablet Commonly known as: ULTRAM Take 50-100 mg by mouth every 6 (six) hours as needed for pain.   Vitamin D (Ergocalciferol) 1.25 MG (50000 UNIT) Caps capsule Commonly known as: DRISDOL Take 50,000 Units by mouth every Sunday.               Discharge Care Instructions  (From admission, onward)           Start     Ordered   04/26/21 0000  Discharge wound care:       Comments: Okay to shower. Do not apply salves or appointments to incision. No heavy lifting with the upper extremities greater than 10 pounds. May resume driving when not requiring pain medication and patient feels comfortable with doing so.   04/26/21 0818             Signed: Shary Key Chaz Mcglasson 04/26/2021, 8:27 AM

## 2021-04-26 NOTE — Anesthesia Postprocedure Evaluation (Signed)
Anesthesia Post Note  Patient: MAKAYLYN SINYARD  Procedure(s) Performed: Cervical seven-Thoracic one; Thoracic one-two Left Laminectomy/Foraminotomy with metrix (Left: Back)     Patient location during evaluation: PACU Anesthesia Type: General Level of consciousness: awake and alert Pain management: pain level controlled Vital Signs Assessment: post-procedure vital signs reviewed and stable Respiratory status: spontaneous breathing, nonlabored ventilation, respiratory function stable and patient connected to nasal cannula oxygen Cardiovascular status: blood pressure returned to baseline and stable Postop Assessment: no apparent nausea or vomiting Anesthetic complications: no   No notable events documented.  Last Vitals:  Vitals:   04/26/21 0403 04/26/21 0723  BP: 121/62 (!) 118/49  Pulse: 85 71  Resp: 18 18  Temp: 36.7 C (!) 36.4 C  SpO2: 98% 98%    Last Pain:  Vitals:   04/26/21 0723  TempSrc: Oral  PainSc:                  Maleena Eddleman L Alyric Parkin

## 2021-05-20 DIAGNOSIS — Z1231 Encounter for screening mammogram for malignant neoplasm of breast: Secondary | ICD-10-CM | POA: Diagnosis not present

## 2021-05-20 DIAGNOSIS — Z01419 Encounter for gynecological examination (general) (routine) without abnormal findings: Secondary | ICD-10-CM | POA: Diagnosis not present

## 2021-05-20 DIAGNOSIS — Z6830 Body mass index (BMI) 30.0-30.9, adult: Secondary | ICD-10-CM | POA: Diagnosis not present

## 2021-05-30 DIAGNOSIS — E039 Hypothyroidism, unspecified: Secondary | ICD-10-CM | POA: Diagnosis not present

## 2021-05-30 DIAGNOSIS — I10 Essential (primary) hypertension: Secondary | ICD-10-CM | POA: Diagnosis not present

## 2021-06-06 DIAGNOSIS — I700 Atherosclerosis of aorta: Secondary | ICD-10-CM | POA: Diagnosis not present

## 2021-06-06 DIAGNOSIS — I10 Essential (primary) hypertension: Secondary | ICD-10-CM | POA: Diagnosis not present

## 2021-06-06 DIAGNOSIS — I519 Heart disease, unspecified: Secondary | ICD-10-CM | POA: Diagnosis not present

## 2021-06-06 DIAGNOSIS — E039 Hypothyroidism, unspecified: Secondary | ICD-10-CM | POA: Diagnosis not present

## 2021-06-06 DIAGNOSIS — M0600 Rheumatoid arthritis without rheumatoid factor, unspecified site: Secondary | ICD-10-CM | POA: Diagnosis not present

## 2021-06-06 DIAGNOSIS — Z0000 Encounter for general adult medical examination without abnormal findings: Secondary | ICD-10-CM | POA: Diagnosis not present

## 2021-06-06 DIAGNOSIS — M797 Fibromyalgia: Secondary | ICD-10-CM | POA: Diagnosis not present

## 2021-06-26 DIAGNOSIS — M5000 Cervical disc disorder with myelopathy, unspecified cervical region: Secondary | ICD-10-CM | POA: Diagnosis not present

## 2021-06-26 DIAGNOSIS — D509 Iron deficiency anemia, unspecified: Secondary | ICD-10-CM | POA: Diagnosis not present

## 2021-06-26 DIAGNOSIS — M797 Fibromyalgia: Secondary | ICD-10-CM | POA: Diagnosis not present

## 2021-06-26 DIAGNOSIS — E559 Vitamin D deficiency, unspecified: Secondary | ICD-10-CM | POA: Diagnosis not present

## 2021-06-26 DIAGNOSIS — M5416 Radiculopathy, lumbar region: Secondary | ICD-10-CM | POA: Diagnosis not present

## 2021-06-26 DIAGNOSIS — K317 Polyp of stomach and duodenum: Secondary | ICD-10-CM | POA: Diagnosis not present

## 2021-06-26 DIAGNOSIS — E039 Hypothyroidism, unspecified: Secondary | ICD-10-CM | POA: Diagnosis not present

## 2021-06-26 DIAGNOSIS — I10 Essential (primary) hypertension: Secondary | ICD-10-CM | POA: Diagnosis not present

## 2021-06-26 DIAGNOSIS — R269 Unspecified abnormalities of gait and mobility: Secondary | ICD-10-CM | POA: Diagnosis not present

## 2021-06-26 DIAGNOSIS — I700 Atherosclerosis of aorta: Secondary | ICD-10-CM | POA: Diagnosis not present

## 2021-06-26 DIAGNOSIS — E785 Hyperlipidemia, unspecified: Secondary | ICD-10-CM | POA: Diagnosis not present

## 2021-07-01 DIAGNOSIS — M542 Cervicalgia: Secondary | ICD-10-CM | POA: Diagnosis not present

## 2021-07-01 DIAGNOSIS — M5412 Radiculopathy, cervical region: Secondary | ICD-10-CM | POA: Diagnosis not present

## 2021-07-09 DIAGNOSIS — M5412 Radiculopathy, cervical region: Secondary | ICD-10-CM | POA: Diagnosis not present

## 2021-07-09 DIAGNOSIS — M542 Cervicalgia: Secondary | ICD-10-CM | POA: Diagnosis not present

## 2021-07-16 DIAGNOSIS — M542 Cervicalgia: Secondary | ICD-10-CM | POA: Diagnosis not present

## 2021-07-16 DIAGNOSIS — M5412 Radiculopathy, cervical region: Secondary | ICD-10-CM | POA: Diagnosis not present

## 2021-07-23 DIAGNOSIS — M5412 Radiculopathy, cervical region: Secondary | ICD-10-CM | POA: Diagnosis not present

## 2021-07-23 DIAGNOSIS — M542 Cervicalgia: Secondary | ICD-10-CM | POA: Diagnosis not present

## 2021-07-24 DIAGNOSIS — M545 Low back pain, unspecified: Secondary | ICD-10-CM | POA: Diagnosis not present

## 2021-07-24 DIAGNOSIS — M797 Fibromyalgia: Secondary | ICD-10-CM | POA: Diagnosis not present

## 2021-07-24 DIAGNOSIS — Z79899 Other long term (current) drug therapy: Secondary | ICD-10-CM | POA: Diagnosis not present

## 2021-07-24 DIAGNOSIS — M1120 Other chondrocalcinosis, unspecified site: Secondary | ICD-10-CM | POA: Diagnosis not present

## 2021-07-24 DIAGNOSIS — M0609 Rheumatoid arthritis without rheumatoid factor, multiple sites: Secondary | ICD-10-CM | POA: Diagnosis not present

## 2021-07-24 DIAGNOSIS — E669 Obesity, unspecified: Secondary | ICD-10-CM | POA: Diagnosis not present

## 2021-07-24 DIAGNOSIS — M150 Primary generalized (osteo)arthritis: Secondary | ICD-10-CM | POA: Diagnosis not present

## 2021-07-24 DIAGNOSIS — Z6835 Body mass index (BMI) 35.0-35.9, adult: Secondary | ICD-10-CM | POA: Diagnosis not present

## 2021-08-13 DIAGNOSIS — M5412 Radiculopathy, cervical region: Secondary | ICD-10-CM | POA: Diagnosis not present

## 2021-08-13 DIAGNOSIS — M542 Cervicalgia: Secondary | ICD-10-CM | POA: Diagnosis not present

## 2021-08-20 DIAGNOSIS — M542 Cervicalgia: Secondary | ICD-10-CM | POA: Diagnosis not present

## 2021-08-20 DIAGNOSIS — M5412 Radiculopathy, cervical region: Secondary | ICD-10-CM | POA: Diagnosis not present

## 2021-09-04 ENCOUNTER — Ambulatory Visit: Payer: PPO | Admitting: Dermatology

## 2021-10-21 DIAGNOSIS — M0609 Rheumatoid arthritis without rheumatoid factor, multiple sites: Secondary | ICD-10-CM | POA: Diagnosis not present

## 2021-10-28 DIAGNOSIS — R1013 Epigastric pain: Secondary | ICD-10-CM | POA: Diagnosis not present

## 2021-10-28 DIAGNOSIS — R197 Diarrhea, unspecified: Secondary | ICD-10-CM | POA: Diagnosis not present

## 2021-10-28 DIAGNOSIS — R140 Abdominal distension (gaseous): Secondary | ICD-10-CM | POA: Diagnosis not present

## 2021-12-02 DIAGNOSIS — R1013 Epigastric pain: Secondary | ICD-10-CM | POA: Diagnosis not present

## 2021-12-02 DIAGNOSIS — R197 Diarrhea, unspecified: Secondary | ICD-10-CM | POA: Diagnosis not present

## 2021-12-11 DIAGNOSIS — N941 Unspecified dyspareunia: Secondary | ICD-10-CM | POA: Diagnosis not present

## 2021-12-11 DIAGNOSIS — R300 Dysuria: Secondary | ICD-10-CM | POA: Diagnosis not present

## 2021-12-17 DIAGNOSIS — N3000 Acute cystitis without hematuria: Secondary | ICD-10-CM | POA: Diagnosis not present

## 2021-12-21 IMAGING — DX DG CHEST 1V PORT
1 series · 1 of 1 positions shown · non-contrast
Comparison: 06/28/2020 at [DATE] p.m.

CLINICAL DATA: Pneumothorax, chest tube

EXAM:
PORTABLE CHEST 1 VIEW

[chest ap]
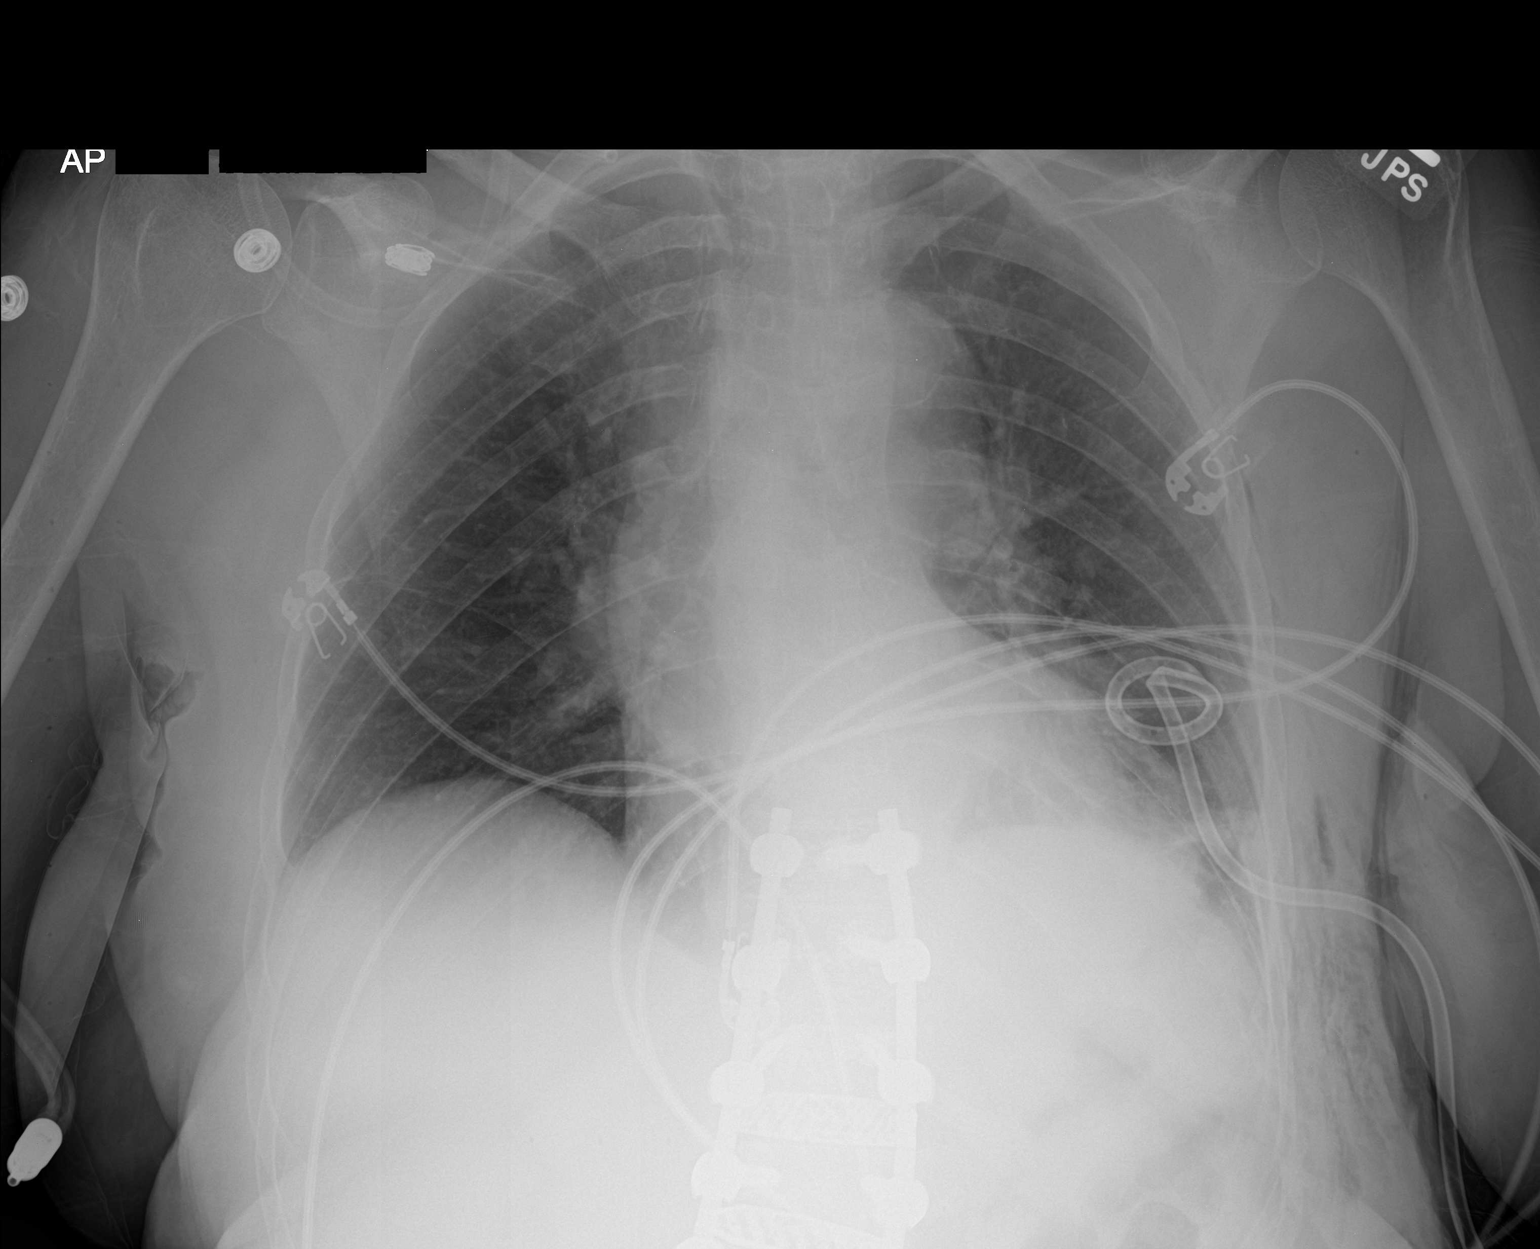

[1 of 1 positions shown; findings below may reference images not displayed]

FINDINGS: Left basilar pigtail chest tube is again identified. The catheter
appears kinked just beyond the retaining loop. Lung volumes are
small, but are symmetric. Minimal left basilar atelectasis. No
pneumothorax or pleural effusion. Cardiac size within normal limits.
Thoracolumbar fusion hardware is partially visualized. Left chest
wall subcutaneous gas again noted.
IMPRESSION: Interval slight repositioning of the a left basilar pigtail chest
tube with complete evacuation of previously noted pneumothorax. The
catheter does, however, appear kinked and correlation for
appropriate catheter position at the skin site may be helpful.

## 2021-12-23 IMAGING — DX DG CHEST 1V PORT
1 series · 1 of 1 positions shown · non-contrast
Comparison: Chest x-rays dated 06/30/2020 and 06/29/2020.

CLINICAL DATA: LEFT pneumothorax.

EXAM:
PORTABLE CHEST 1 VIEW

[chest]
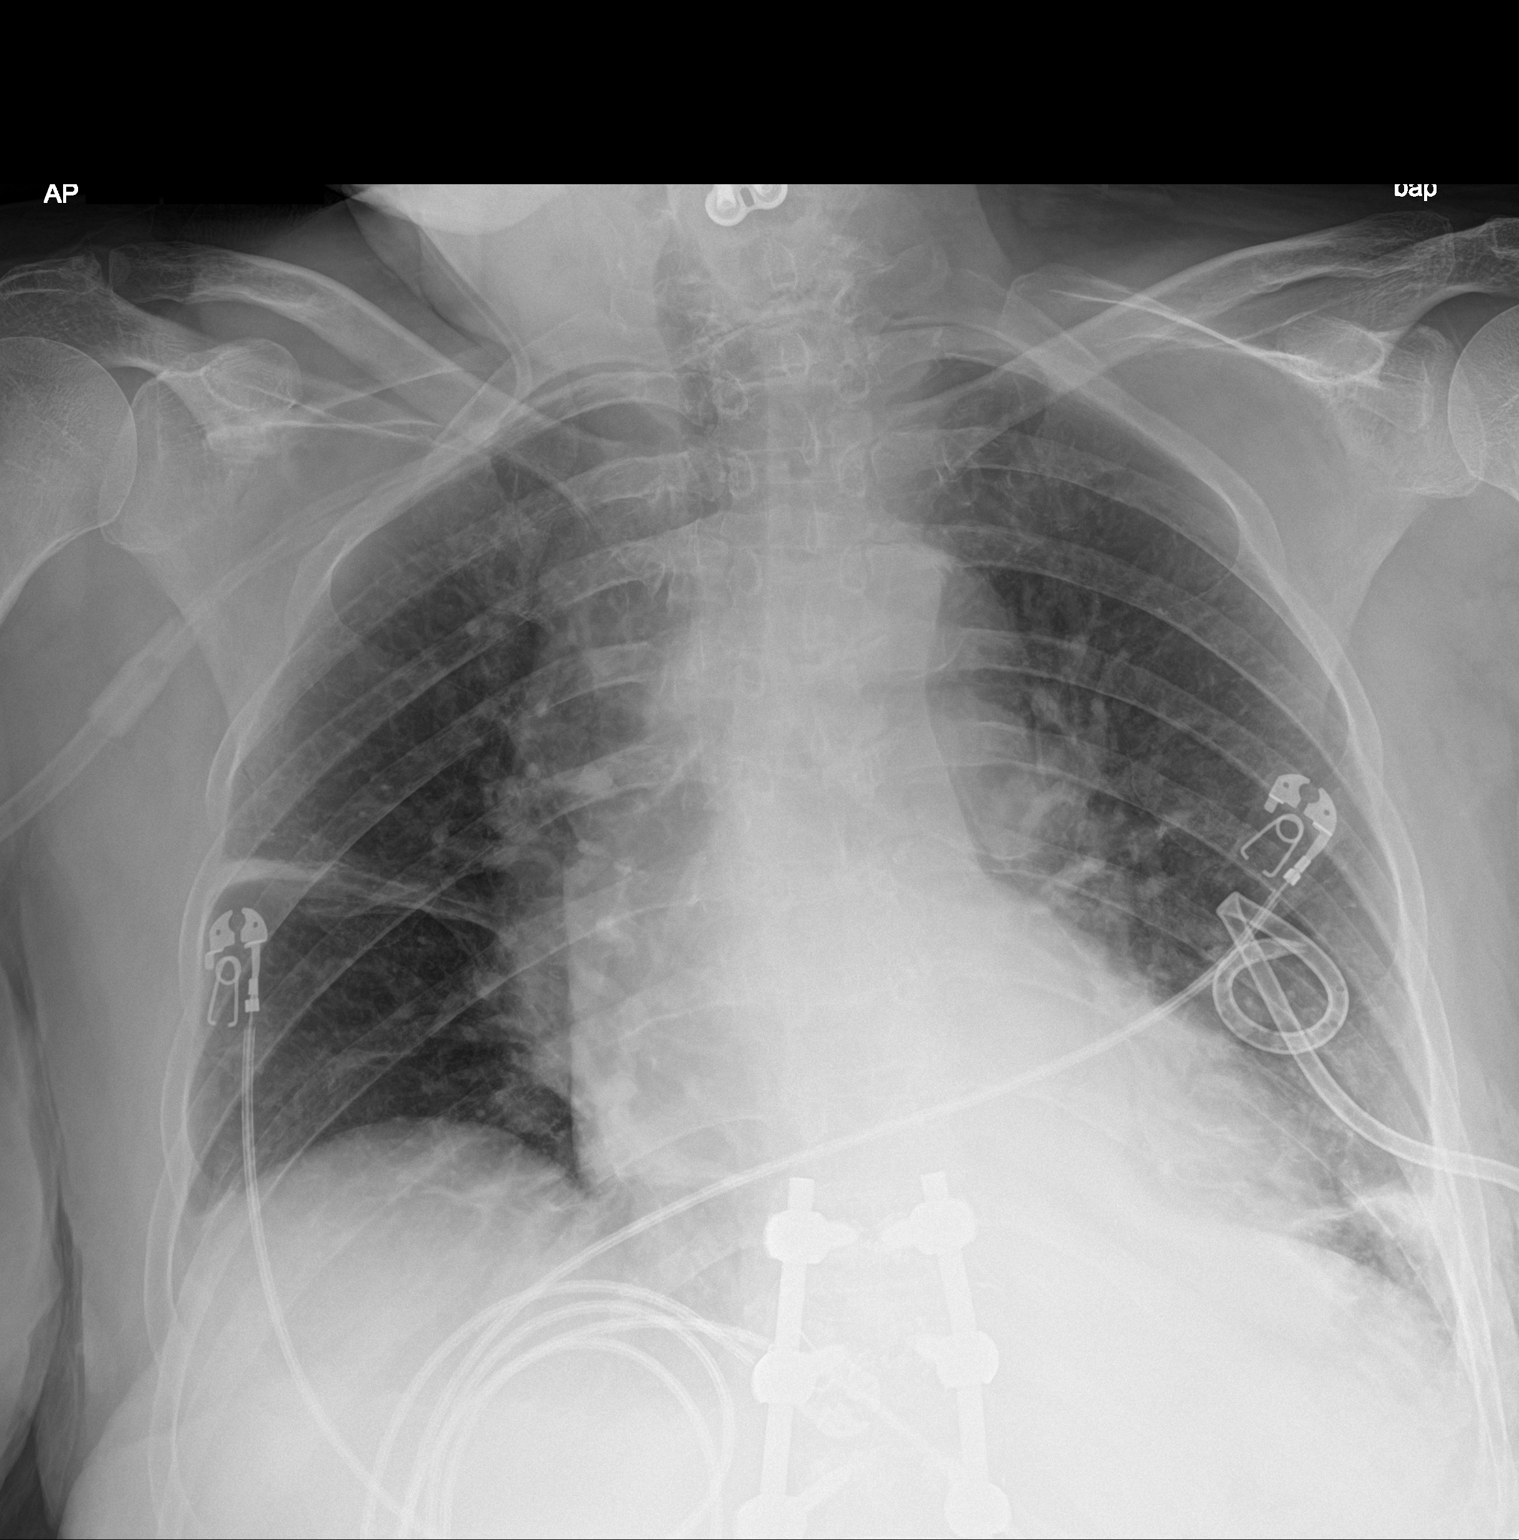

[1 of 1 positions shown; findings below may reference images not displayed]

FINDINGS: LEFT-sided chest tube is stable in position, coiled at the LEFT lung
base. No pneumothorax is seen. Persistent atelectasis at the LEFT
costophrenic angle. New thickening/fluid at the RIGHT minor fissure.
RIGHT lung is otherwise clear. Heart size and mediastinal contours
are grossly stable.
IMPRESSION: 1. LEFT-sided chest tube is stable in position. No pneumothorax
seen.
2. Persistent atelectasis at the LEFT costophrenic angle.
3. New mild thickening/fluid at the RIGHT minor fissure, favor small
RIGHT pleural effusion extending to the fissure.

## 2021-12-25 IMAGING — CR DG LUMBAR SPINE 2-3V
3 series · 3 of 3 positions shown · non-contrast
Comparison: Preoperative imaging 06/01/2020

CLINICAL DATA: Recent lumbar fusion.

EXAM:
LUMBAR SPINE - 2-3 VIEW

[l-spine ap]
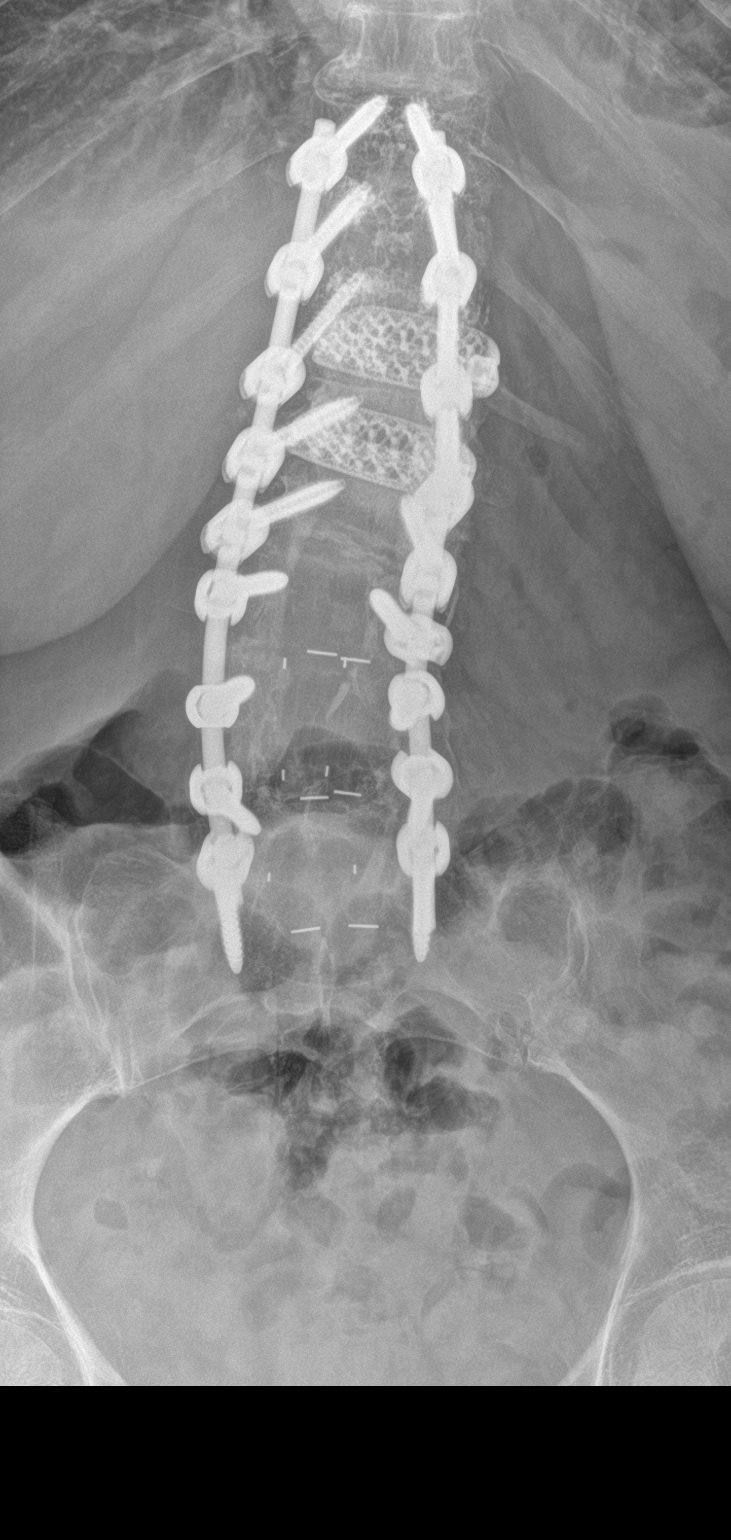

[l-spine lat]
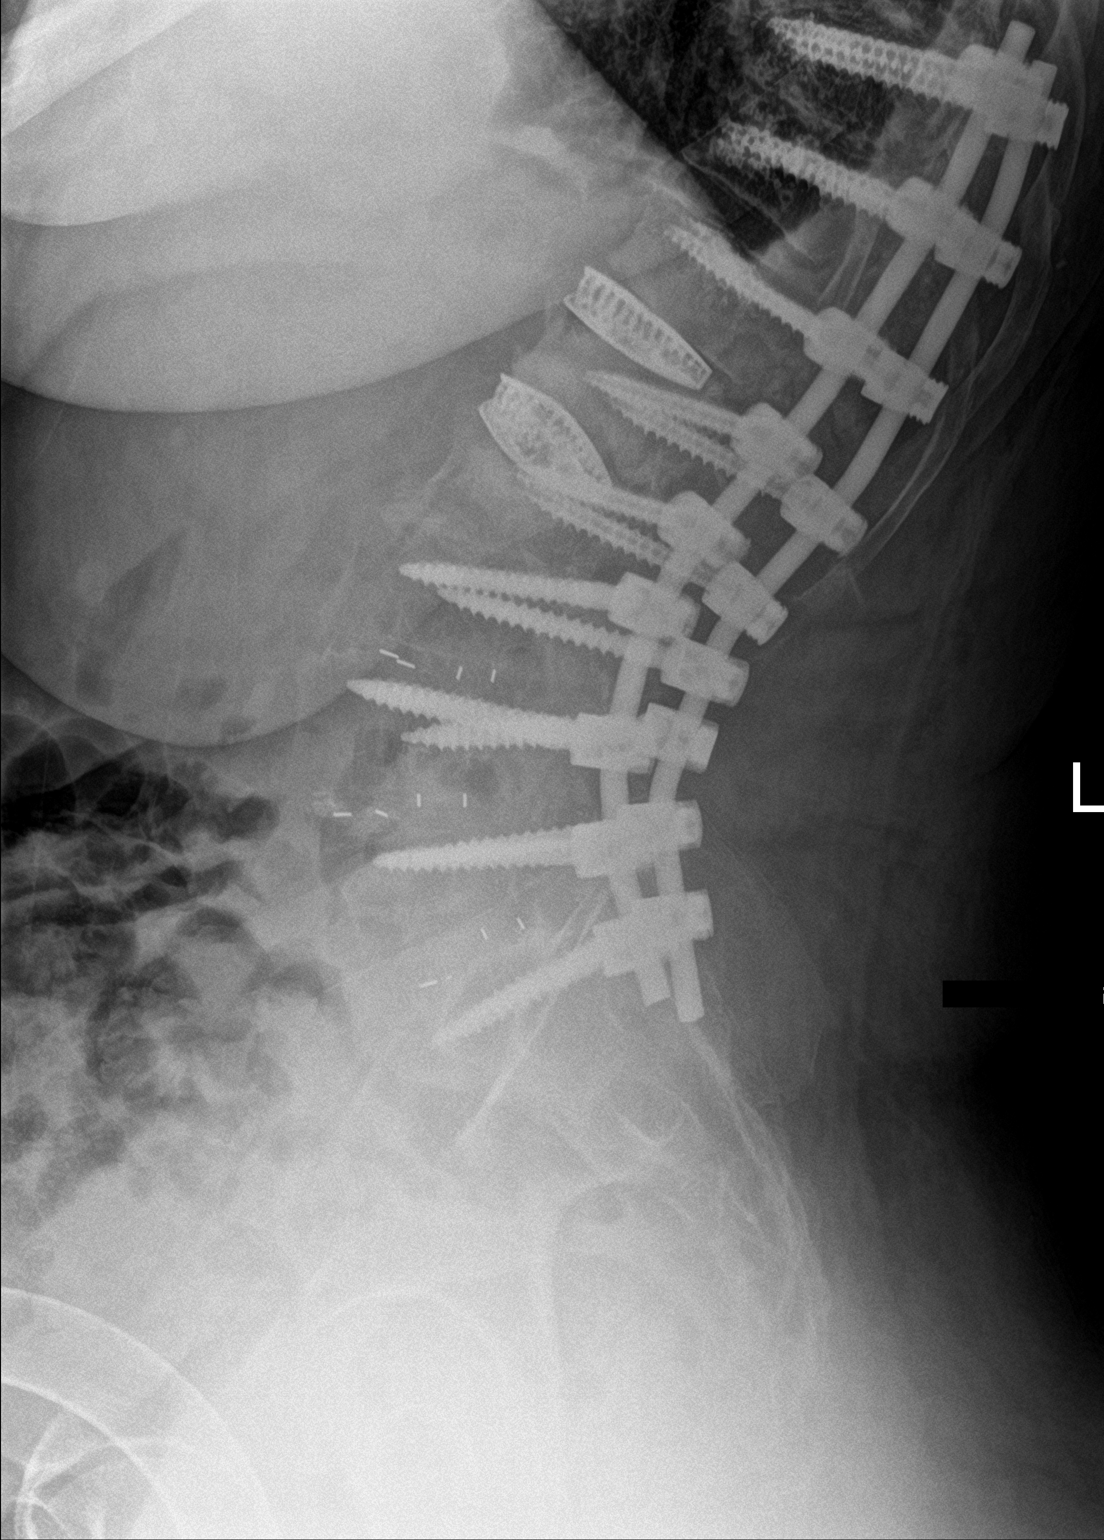

[l-spine spot]
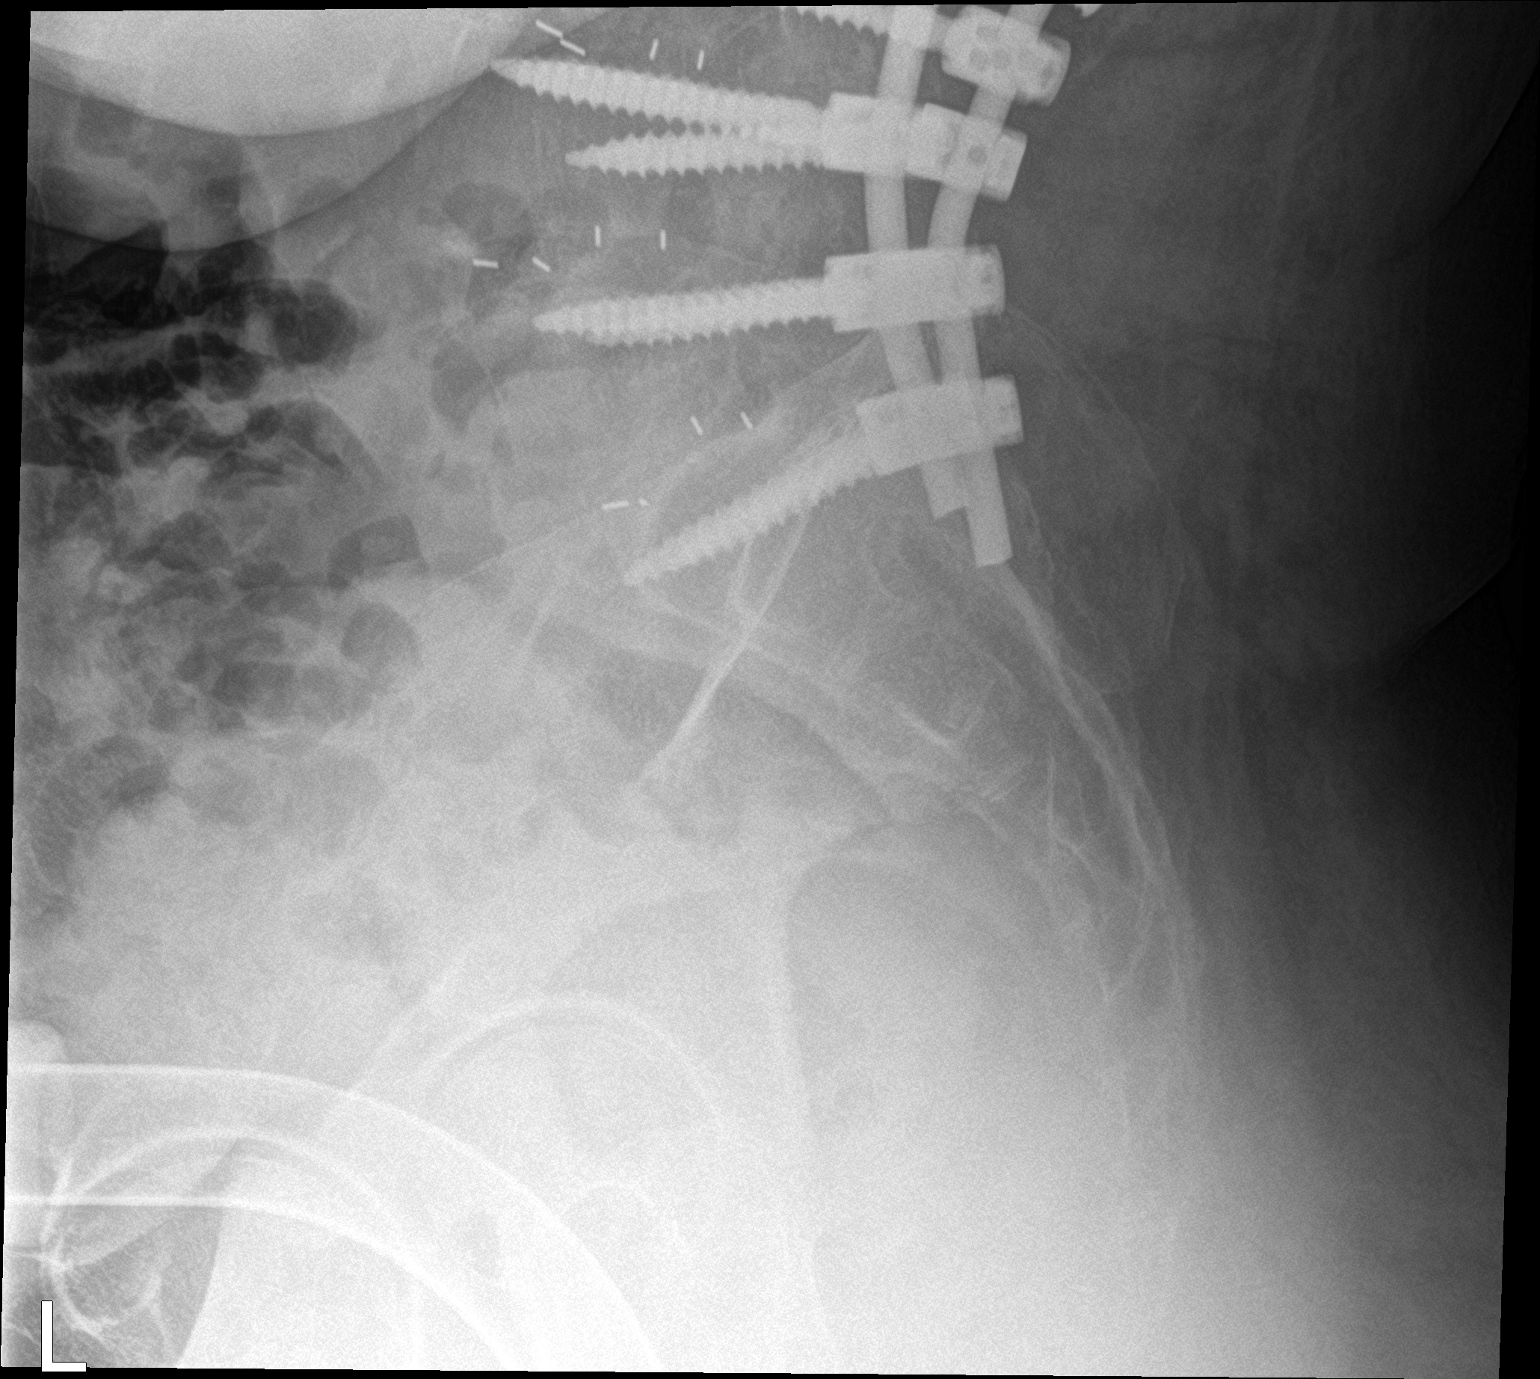

[3 of 3 positions shown; findings below may reference images not displayed]

FINDINGS: Imaging obtained standing. Extension of lumbar fusion from prior L3
through S1, now extending to the level of T10. Posterior rod with
intrapedicular screws. Slight decrease in scoliotic curvature of the
upper lumbar spine from preoperative radiograph. Interbody spacers
at T12-L1 and L1-L2. Chronic compression fractures of T12, L1, and
L2. No evidence of acute fracture.
IMPRESSION: 1. Extension of lumbar fusion from prior L3 through S1, now
extending to the level of T10.
2. Chronic mild compression fractures of T12, L1, and L2.

## 2021-12-30 DIAGNOSIS — M170 Bilateral primary osteoarthritis of knee: Secondary | ICD-10-CM | POA: Diagnosis not present

## 2021-12-30 DIAGNOSIS — D509 Iron deficiency anemia, unspecified: Secondary | ICD-10-CM | POA: Diagnosis not present

## 2021-12-30 DIAGNOSIS — M5416 Radiculopathy, lumbar region: Secondary | ICD-10-CM | POA: Diagnosis not present

## 2021-12-30 DIAGNOSIS — R269 Unspecified abnormalities of gait and mobility: Secondary | ICD-10-CM | POA: Diagnosis not present

## 2021-12-30 DIAGNOSIS — E039 Hypothyroidism, unspecified: Secondary | ICD-10-CM | POA: Diagnosis not present

## 2021-12-30 DIAGNOSIS — I700 Atherosclerosis of aorta: Secondary | ICD-10-CM | POA: Diagnosis not present

## 2021-12-30 DIAGNOSIS — M5000 Cervical disc disorder with myelopathy, unspecified cervical region: Secondary | ICD-10-CM | POA: Diagnosis not present

## 2021-12-30 DIAGNOSIS — M0600 Rheumatoid arthritis without rheumatoid factor, unspecified site: Secondary | ICD-10-CM | POA: Diagnosis not present

## 2021-12-30 DIAGNOSIS — I10 Essential (primary) hypertension: Secondary | ICD-10-CM | POA: Diagnosis not present

## 2021-12-30 DIAGNOSIS — E559 Vitamin D deficiency, unspecified: Secondary | ICD-10-CM | POA: Diagnosis not present

## 2021-12-30 DIAGNOSIS — E785 Hyperlipidemia, unspecified: Secondary | ICD-10-CM | POA: Diagnosis not present

## 2022-01-21 DIAGNOSIS — Z6833 Body mass index (BMI) 33.0-33.9, adult: Secondary | ICD-10-CM | POA: Diagnosis not present

## 2022-01-21 DIAGNOSIS — M1120 Other chondrocalcinosis, unspecified site: Secondary | ICD-10-CM | POA: Diagnosis not present

## 2022-01-21 DIAGNOSIS — M25532 Pain in left wrist: Secondary | ICD-10-CM | POA: Diagnosis not present

## 2022-01-21 DIAGNOSIS — M0609 Rheumatoid arthritis without rheumatoid factor, multiple sites: Secondary | ICD-10-CM | POA: Diagnosis not present

## 2022-01-21 DIAGNOSIS — M545 Low back pain, unspecified: Secondary | ICD-10-CM | POA: Diagnosis not present

## 2022-01-21 DIAGNOSIS — M797 Fibromyalgia: Secondary | ICD-10-CM | POA: Diagnosis not present

## 2022-01-21 DIAGNOSIS — E669 Obesity, unspecified: Secondary | ICD-10-CM | POA: Diagnosis not present

## 2022-01-21 DIAGNOSIS — M1991 Primary osteoarthritis, unspecified site: Secondary | ICD-10-CM | POA: Diagnosis not present

## 2022-01-21 DIAGNOSIS — Z79899 Other long term (current) drug therapy: Secondary | ICD-10-CM | POA: Diagnosis not present

## 2022-02-14 DIAGNOSIS — M19032 Primary osteoarthritis, left wrist: Secondary | ICD-10-CM | POA: Diagnosis not present

## 2022-03-12 DIAGNOSIS — M0609 Rheumatoid arthritis without rheumatoid factor, multiple sites: Secondary | ICD-10-CM | POA: Diagnosis not present

## 2022-03-12 DIAGNOSIS — M1991 Primary osteoarthritis, unspecified site: Secondary | ICD-10-CM | POA: Diagnosis not present

## 2022-03-12 DIAGNOSIS — M1120 Other chondrocalcinosis, unspecified site: Secondary | ICD-10-CM | POA: Diagnosis not present

## 2022-03-12 DIAGNOSIS — E669 Obesity, unspecified: Secondary | ICD-10-CM | POA: Diagnosis not present

## 2022-03-12 DIAGNOSIS — Z79899 Other long term (current) drug therapy: Secondary | ICD-10-CM | POA: Diagnosis not present

## 2022-03-12 DIAGNOSIS — M797 Fibromyalgia: Secondary | ICD-10-CM | POA: Diagnosis not present

## 2022-03-12 DIAGNOSIS — Z6833 Body mass index (BMI) 33.0-33.9, adult: Secondary | ICD-10-CM | POA: Diagnosis not present

## 2022-03-12 DIAGNOSIS — R5383 Other fatigue: Secondary | ICD-10-CM | POA: Diagnosis not present

## 2022-06-09 DIAGNOSIS — M0609 Rheumatoid arthritis without rheumatoid factor, multiple sites: Secondary | ICD-10-CM | POA: Diagnosis not present

## 2022-06-09 DIAGNOSIS — M1991 Primary osteoarthritis, unspecified site: Secondary | ICD-10-CM | POA: Diagnosis not present

## 2022-06-09 DIAGNOSIS — Z6832 Body mass index (BMI) 32.0-32.9, adult: Secondary | ICD-10-CM | POA: Diagnosis not present

## 2022-06-09 DIAGNOSIS — E669 Obesity, unspecified: Secondary | ICD-10-CM | POA: Diagnosis not present

## 2022-06-09 DIAGNOSIS — M1120 Other chondrocalcinosis, unspecified site: Secondary | ICD-10-CM | POA: Diagnosis not present

## 2022-06-09 DIAGNOSIS — R5383 Other fatigue: Secondary | ICD-10-CM | POA: Diagnosis not present

## 2022-06-09 DIAGNOSIS — Z79899 Other long term (current) drug therapy: Secondary | ICD-10-CM | POA: Diagnosis not present

## 2022-06-09 DIAGNOSIS — M797 Fibromyalgia: Secondary | ICD-10-CM | POA: Diagnosis not present

## 2022-07-02 DIAGNOSIS — D509 Iron deficiency anemia, unspecified: Secondary | ICD-10-CM | POA: Diagnosis not present

## 2022-07-02 DIAGNOSIS — E039 Hypothyroidism, unspecified: Secondary | ICD-10-CM | POA: Diagnosis not present

## 2022-07-02 DIAGNOSIS — K317 Polyp of stomach and duodenum: Secondary | ICD-10-CM | POA: Diagnosis not present

## 2022-07-02 DIAGNOSIS — E559 Vitamin D deficiency, unspecified: Secondary | ICD-10-CM | POA: Diagnosis not present

## 2022-07-02 DIAGNOSIS — I10 Essential (primary) hypertension: Secondary | ICD-10-CM | POA: Diagnosis not present

## 2022-07-02 DIAGNOSIS — M797 Fibromyalgia: Secondary | ICD-10-CM | POA: Diagnosis not present

## 2022-07-02 DIAGNOSIS — E785 Hyperlipidemia, unspecified: Secondary | ICD-10-CM | POA: Diagnosis not present

## 2022-07-02 DIAGNOSIS — M0600 Rheumatoid arthritis without rheumatoid factor, unspecified site: Secondary | ICD-10-CM | POA: Diagnosis not present

## 2022-07-02 DIAGNOSIS — M170 Bilateral primary osteoarthritis of knee: Secondary | ICD-10-CM | POA: Diagnosis not present

## 2022-07-02 DIAGNOSIS — R269 Unspecified abnormalities of gait and mobility: Secondary | ICD-10-CM | POA: Diagnosis not present

## 2022-07-02 DIAGNOSIS — I700 Atherosclerosis of aorta: Secondary | ICD-10-CM | POA: Diagnosis not present

## 2022-07-31 DIAGNOSIS — M0600 Rheumatoid arthritis without rheumatoid factor, unspecified site: Secondary | ICD-10-CM | POA: Diagnosis not present

## 2022-07-31 DIAGNOSIS — I700 Atherosclerosis of aorta: Secondary | ICD-10-CM | POA: Diagnosis not present

## 2022-07-31 DIAGNOSIS — E039 Hypothyroidism, unspecified: Secondary | ICD-10-CM | POA: Diagnosis not present

## 2022-07-31 DIAGNOSIS — M797 Fibromyalgia: Secondary | ICD-10-CM | POA: Diagnosis not present

## 2022-07-31 DIAGNOSIS — I519 Heart disease, unspecified: Secondary | ICD-10-CM | POA: Diagnosis not present

## 2022-07-31 DIAGNOSIS — I10 Essential (primary) hypertension: Secondary | ICD-10-CM | POA: Diagnosis not present

## 2022-08-04 DIAGNOSIS — I700 Atherosclerosis of aorta: Secondary | ICD-10-CM | POA: Diagnosis not present

## 2022-08-04 DIAGNOSIS — Z0000 Encounter for general adult medical examination without abnormal findings: Secondary | ICD-10-CM | POA: Diagnosis not present

## 2022-08-04 DIAGNOSIS — N182 Chronic kidney disease, stage 2 (mild): Secondary | ICD-10-CM | POA: Diagnosis not present

## 2022-08-04 DIAGNOSIS — M0600 Rheumatoid arthritis without rheumatoid factor, unspecified site: Secondary | ICD-10-CM | POA: Diagnosis not present

## 2022-08-04 DIAGNOSIS — M797 Fibromyalgia: Secondary | ICD-10-CM | POA: Diagnosis not present

## 2022-08-04 DIAGNOSIS — E039 Hypothyroidism, unspecified: Secondary | ICD-10-CM | POA: Diagnosis not present

## 2022-08-04 DIAGNOSIS — I519 Heart disease, unspecified: Secondary | ICD-10-CM | POA: Diagnosis not present

## 2022-08-04 DIAGNOSIS — I10 Essential (primary) hypertension: Secondary | ICD-10-CM | POA: Diagnosis not present

## 2022-08-04 DIAGNOSIS — I350 Nonrheumatic aortic (valve) stenosis: Secondary | ICD-10-CM | POA: Diagnosis not present

## 2022-08-05 DIAGNOSIS — D225 Melanocytic nevi of trunk: Secondary | ICD-10-CM | POA: Diagnosis not present

## 2022-08-05 DIAGNOSIS — R202 Paresthesia of skin: Secondary | ICD-10-CM | POA: Diagnosis not present

## 2022-08-05 DIAGNOSIS — C44619 Basal cell carcinoma of skin of left upper limb, including shoulder: Secondary | ICD-10-CM | POA: Diagnosis not present

## 2022-08-05 DIAGNOSIS — L814 Other melanin hyperpigmentation: Secondary | ICD-10-CM | POA: Diagnosis not present

## 2022-08-05 DIAGNOSIS — L821 Other seborrheic keratosis: Secondary | ICD-10-CM | POA: Diagnosis not present

## 2022-08-06 DIAGNOSIS — I350 Nonrheumatic aortic (valve) stenosis: Secondary | ICD-10-CM | POA: Diagnosis not present

## 2022-08-06 DIAGNOSIS — I519 Heart disease, unspecified: Secondary | ICD-10-CM | POA: Diagnosis not present

## 2022-09-01 DIAGNOSIS — G8929 Other chronic pain: Secondary | ICD-10-CM | POA: Diagnosis not present

## 2022-09-01 DIAGNOSIS — M069 Rheumatoid arthritis, unspecified: Secondary | ICD-10-CM | POA: Diagnosis not present

## 2022-09-01 DIAGNOSIS — F419 Anxiety disorder, unspecified: Secondary | ICD-10-CM | POA: Diagnosis not present

## 2022-09-01 DIAGNOSIS — J309 Allergic rhinitis, unspecified: Secondary | ICD-10-CM | POA: Diagnosis not present

## 2022-09-01 DIAGNOSIS — G2581 Restless legs syndrome: Secondary | ICD-10-CM | POA: Diagnosis not present

## 2022-09-01 DIAGNOSIS — K219 Gastro-esophageal reflux disease without esophagitis: Secondary | ICD-10-CM | POA: Diagnosis not present

## 2022-09-01 DIAGNOSIS — H9191 Unspecified hearing loss, right ear: Secondary | ICD-10-CM | POA: Diagnosis not present

## 2022-09-01 DIAGNOSIS — E559 Vitamin D deficiency, unspecified: Secondary | ICD-10-CM | POA: Diagnosis not present

## 2022-09-01 DIAGNOSIS — I10 Essential (primary) hypertension: Secondary | ICD-10-CM | POA: Diagnosis not present

## 2022-09-01 DIAGNOSIS — I8312 Varicose veins of left lower extremity with inflammation: Secondary | ICD-10-CM | POA: Diagnosis not present

## 2022-09-01 DIAGNOSIS — D84821 Immunodeficiency due to drugs: Secondary | ICD-10-CM | POA: Diagnosis not present

## 2022-09-01 DIAGNOSIS — E039 Hypothyroidism, unspecified: Secondary | ICD-10-CM | POA: Diagnosis not present

## 2022-09-09 DIAGNOSIS — Z6833 Body mass index (BMI) 33.0-33.9, adult: Secondary | ICD-10-CM | POA: Diagnosis not present

## 2022-09-09 DIAGNOSIS — M19032 Primary osteoarthritis, left wrist: Secondary | ICD-10-CM | POA: Diagnosis not present

## 2022-09-09 DIAGNOSIS — E669 Obesity, unspecified: Secondary | ICD-10-CM | POA: Diagnosis not present

## 2022-09-09 DIAGNOSIS — Z79899 Other long term (current) drug therapy: Secondary | ICD-10-CM | POA: Diagnosis not present

## 2022-09-09 DIAGNOSIS — R5383 Other fatigue: Secondary | ICD-10-CM | POA: Diagnosis not present

## 2022-09-09 DIAGNOSIS — M79642 Pain in left hand: Secondary | ICD-10-CM | POA: Diagnosis not present

## 2022-09-09 DIAGNOSIS — M1991 Primary osteoarthritis, unspecified site: Secondary | ICD-10-CM | POA: Diagnosis not present

## 2022-09-09 DIAGNOSIS — M797 Fibromyalgia: Secondary | ICD-10-CM | POA: Diagnosis not present

## 2022-09-09 DIAGNOSIS — M1120 Other chondrocalcinosis, unspecified site: Secondary | ICD-10-CM | POA: Diagnosis not present

## 2022-09-09 DIAGNOSIS — M0609 Rheumatoid arthritis without rheumatoid factor, multiple sites: Secondary | ICD-10-CM | POA: Diagnosis not present

## 2022-09-10 DIAGNOSIS — I872 Venous insufficiency (chronic) (peripheral): Secondary | ICD-10-CM | POA: Diagnosis not present

## 2022-09-10 DIAGNOSIS — I8392 Asymptomatic varicose veins of left lower extremity: Secondary | ICD-10-CM | POA: Diagnosis not present

## 2022-09-24 DIAGNOSIS — I10 Essential (primary) hypertension: Secondary | ICD-10-CM | POA: Diagnosis not present

## 2022-09-24 DIAGNOSIS — M797 Fibromyalgia: Secondary | ICD-10-CM | POA: Diagnosis not present

## 2022-09-24 DIAGNOSIS — M0600 Rheumatoid arthritis without rheumatoid factor, unspecified site: Secondary | ICD-10-CM | POA: Diagnosis not present

## 2022-10-13 DIAGNOSIS — M79675 Pain in left toe(s): Secondary | ICD-10-CM | POA: Diagnosis not present

## 2022-10-13 DIAGNOSIS — I10 Essential (primary) hypertension: Secondary | ICD-10-CM | POA: Diagnosis not present

## 2022-10-13 DIAGNOSIS — N182 Chronic kidney disease, stage 2 (mild): Secondary | ICD-10-CM | POA: Diagnosis not present

## 2022-10-13 DIAGNOSIS — I519 Heart disease, unspecified: Secondary | ICD-10-CM | POA: Diagnosis not present

## 2022-10-13 DIAGNOSIS — E039 Hypothyroidism, unspecified: Secondary | ICD-10-CM | POA: Diagnosis not present

## 2022-10-13 DIAGNOSIS — I8312 Varicose veins of left lower extremity with inflammation: Secondary | ICD-10-CM | POA: Diagnosis not present

## 2022-10-29 DIAGNOSIS — I10 Essential (primary) hypertension: Secondary | ICD-10-CM | POA: Diagnosis not present

## 2022-10-29 DIAGNOSIS — N182 Chronic kidney disease, stage 2 (mild): Secondary | ICD-10-CM | POA: Diagnosis not present

## 2022-11-07 DIAGNOSIS — M79641 Pain in right hand: Secondary | ICD-10-CM | POA: Diagnosis not present

## 2022-11-07 DIAGNOSIS — E669 Obesity, unspecified: Secondary | ICD-10-CM | POA: Diagnosis not present

## 2022-11-07 DIAGNOSIS — Z79899 Other long term (current) drug therapy: Secondary | ICD-10-CM | POA: Diagnosis not present

## 2022-11-07 DIAGNOSIS — M797 Fibromyalgia: Secondary | ICD-10-CM | POA: Diagnosis not present

## 2022-11-07 DIAGNOSIS — M79642 Pain in left hand: Secondary | ICD-10-CM | POA: Diagnosis not present

## 2022-11-07 DIAGNOSIS — Z6833 Body mass index (BMI) 33.0-33.9, adult: Secondary | ICD-10-CM | POA: Diagnosis not present

## 2022-11-07 DIAGNOSIS — R5383 Other fatigue: Secondary | ICD-10-CM | POA: Diagnosis not present

## 2022-11-07 DIAGNOSIS — M1120 Other chondrocalcinosis, unspecified site: Secondary | ICD-10-CM | POA: Diagnosis not present

## 2022-11-07 DIAGNOSIS — M1991 Primary osteoarthritis, unspecified site: Secondary | ICD-10-CM | POA: Diagnosis not present

## 2022-11-07 DIAGNOSIS — M0609 Rheumatoid arthritis without rheumatoid factor, multiple sites: Secondary | ICD-10-CM | POA: Diagnosis not present

## 2022-11-07 DIAGNOSIS — M19032 Primary osteoarthritis, left wrist: Secondary | ICD-10-CM | POA: Diagnosis not present

## 2022-11-26 DIAGNOSIS — I10 Essential (primary) hypertension: Secondary | ICD-10-CM | POA: Diagnosis not present

## 2022-11-26 DIAGNOSIS — N182 Chronic kidney disease, stage 2 (mild): Secondary | ICD-10-CM | POA: Diagnosis not present

## 2022-12-29 DIAGNOSIS — K317 Polyp of stomach and duodenum: Secondary | ICD-10-CM | POA: Diagnosis not present

## 2022-12-29 DIAGNOSIS — E559 Vitamin D deficiency, unspecified: Secondary | ICD-10-CM | POA: Diagnosis not present

## 2022-12-29 DIAGNOSIS — E039 Hypothyroidism, unspecified: Secondary | ICD-10-CM | POA: Diagnosis not present

## 2022-12-29 DIAGNOSIS — M5000 Cervical disc disorder with myelopathy, unspecified cervical region: Secondary | ICD-10-CM | POA: Diagnosis not present

## 2022-12-29 DIAGNOSIS — R269 Unspecified abnormalities of gait and mobility: Secondary | ICD-10-CM | POA: Diagnosis not present

## 2022-12-29 DIAGNOSIS — M0600 Rheumatoid arthritis without rheumatoid factor, unspecified site: Secondary | ICD-10-CM | POA: Diagnosis not present

## 2022-12-29 DIAGNOSIS — M797 Fibromyalgia: Secondary | ICD-10-CM | POA: Diagnosis not present

## 2022-12-29 DIAGNOSIS — I700 Atherosclerosis of aorta: Secondary | ICD-10-CM | POA: Diagnosis not present

## 2022-12-29 DIAGNOSIS — M170 Bilateral primary osteoarthritis of knee: Secondary | ICD-10-CM | POA: Diagnosis not present

## 2022-12-29 DIAGNOSIS — E785 Hyperlipidemia, unspecified: Secondary | ICD-10-CM | POA: Diagnosis not present

## 2022-12-29 DIAGNOSIS — I10 Essential (primary) hypertension: Secondary | ICD-10-CM | POA: Diagnosis not present

## 2022-12-29 DIAGNOSIS — D509 Iron deficiency anemia, unspecified: Secondary | ICD-10-CM | POA: Diagnosis not present

## 2023-01-06 DIAGNOSIS — M25532 Pain in left wrist: Secondary | ICD-10-CM | POA: Diagnosis not present

## 2023-01-07 ENCOUNTER — Other Ambulatory Visit: Payer: Self-pay | Admitting: Neurological Surgery

## 2023-01-07 DIAGNOSIS — M5412 Radiculopathy, cervical region: Secondary | ICD-10-CM

## 2023-01-14 ENCOUNTER — Ambulatory Visit
Admission: RE | Admit: 2023-01-14 | Discharge: 2023-01-14 | Disposition: A | Discharge status: Home / Self Care | Payer: PPO | Source: Ambulatory Visit | Attending: Neurological Surgery | Admitting: Neurological Surgery

## 2023-01-14 DIAGNOSIS — M5412 Radiculopathy, cervical region: Secondary | ICD-10-CM | POA: Diagnosis not present

## 2023-01-14 DIAGNOSIS — Z981 Arthrodesis status: Secondary | ICD-10-CM | POA: Diagnosis not present

## 2023-01-27 DIAGNOSIS — Z6832 Body mass index (BMI) 32.0-32.9, adult: Secondary | ICD-10-CM | POA: Diagnosis not present

## 2023-01-27 DIAGNOSIS — M5412 Radiculopathy, cervical region: Secondary | ICD-10-CM | POA: Diagnosis not present

## 2023-02-11 DIAGNOSIS — D225 Melanocytic nevi of trunk: Secondary | ICD-10-CM | POA: Diagnosis not present

## 2023-02-11 DIAGNOSIS — L821 Other seborrheic keratosis: Secondary | ICD-10-CM | POA: Diagnosis not present

## 2023-02-11 DIAGNOSIS — R202 Paresthesia of skin: Secondary | ICD-10-CM | POA: Diagnosis not present

## 2023-02-11 DIAGNOSIS — Z85828 Personal history of other malignant neoplasm of skin: Secondary | ICD-10-CM | POA: Diagnosis not present

## 2023-02-11 DIAGNOSIS — Z08 Encounter for follow-up examination after completed treatment for malignant neoplasm: Secondary | ICD-10-CM | POA: Diagnosis not present

## 2023-02-11 DIAGNOSIS — L603 Nail dystrophy: Secondary | ICD-10-CM | POA: Diagnosis not present

## 2023-02-11 DIAGNOSIS — L814 Other melanin hyperpigmentation: Secondary | ICD-10-CM | POA: Diagnosis not present

## 2023-02-25 DIAGNOSIS — R5383 Other fatigue: Secondary | ICD-10-CM | POA: Diagnosis not present

## 2023-02-25 DIAGNOSIS — Z6833 Body mass index (BMI) 33.0-33.9, adult: Secondary | ICD-10-CM | POA: Diagnosis not present

## 2023-02-25 DIAGNOSIS — M1991 Primary osteoarthritis, unspecified site: Secondary | ICD-10-CM | POA: Diagnosis not present

## 2023-02-25 DIAGNOSIS — M19032 Primary osteoarthritis, left wrist: Secondary | ICD-10-CM | POA: Diagnosis not present

## 2023-02-25 DIAGNOSIS — E669 Obesity, unspecified: Secondary | ICD-10-CM | POA: Diagnosis not present

## 2023-02-25 DIAGNOSIS — M79642 Pain in left hand: Secondary | ICD-10-CM | POA: Diagnosis not present

## 2023-02-25 DIAGNOSIS — M1120 Other chondrocalcinosis, unspecified site: Secondary | ICD-10-CM | POA: Diagnosis not present

## 2023-02-25 DIAGNOSIS — M79641 Pain in right hand: Secondary | ICD-10-CM | POA: Diagnosis not present

## 2023-02-25 DIAGNOSIS — Z79899 Other long term (current) drug therapy: Secondary | ICD-10-CM | POA: Diagnosis not present

## 2023-02-25 DIAGNOSIS — M0609 Rheumatoid arthritis without rheumatoid factor, multiple sites: Secondary | ICD-10-CM | POA: Diagnosis not present

## 2023-02-25 DIAGNOSIS — M797 Fibromyalgia: Secondary | ICD-10-CM | POA: Diagnosis not present

## 2023-04-29 DIAGNOSIS — R152 Fecal urgency: Secondary | ICD-10-CM | POA: Diagnosis not present

## 2023-04-29 DIAGNOSIS — R194 Change in bowel habit: Secondary | ICD-10-CM | POA: Diagnosis not present

## 2023-04-29 DIAGNOSIS — K580 Irritable bowel syndrome with diarrhea: Secondary | ICD-10-CM | POA: Diagnosis not present

## 2023-04-29 DIAGNOSIS — R1013 Epigastric pain: Secondary | ICD-10-CM | POA: Diagnosis not present

## 2023-04-30 ENCOUNTER — Other Ambulatory Visit (HOSPITAL_COMMUNITY): Payer: Self-pay | Admitting: Gastroenterology

## 2023-04-30 DIAGNOSIS — R1033 Periumbilical pain: Secondary | ICD-10-CM

## 2023-05-01 ENCOUNTER — Ambulatory Visit (HOSPITAL_COMMUNITY)
Admission: RE | Admit: 2023-05-01 | Discharge: 2023-05-01 | Disposition: A | Discharge status: Home / Self Care | Payer: PPO | Source: Ambulatory Visit | Attending: Gastroenterology | Admitting: Gastroenterology

## 2023-05-01 DIAGNOSIS — Z90710 Acquired absence of both cervix and uterus: Secondary | ICD-10-CM | POA: Diagnosis not present

## 2023-05-01 DIAGNOSIS — R1033 Periumbilical pain: Secondary | ICD-10-CM | POA: Diagnosis not present

## 2023-05-01 MED ORDER — IOHEXOL 300 MG/ML  SOLN
100.0000 mL | Freq: Once | INTRAMUSCULAR | Status: AC | PRN
  Administered 2023-05-01: 100 mL via INTRAVENOUS

## 2023-05-27 DIAGNOSIS — R110 Nausea: Secondary | ICD-10-CM | POA: Diagnosis not present

## 2023-05-27 DIAGNOSIS — R1012 Left upper quadrant pain: Secondary | ICD-10-CM | POA: Diagnosis not present

## 2023-05-27 DIAGNOSIS — R197 Diarrhea, unspecified: Secondary | ICD-10-CM | POA: Diagnosis not present

## 2023-05-28 DIAGNOSIS — R634 Abnormal weight loss: Secondary | ICD-10-CM | POA: Diagnosis not present

## 2023-05-28 DIAGNOSIS — K3189 Other diseases of stomach and duodenum: Secondary | ICD-10-CM | POA: Diagnosis not present

## 2023-05-28 DIAGNOSIS — K2950 Unspecified chronic gastritis without bleeding: Secondary | ICD-10-CM | POA: Diagnosis not present

## 2023-05-28 DIAGNOSIS — K2970 Gastritis, unspecified, without bleeding: Secondary | ICD-10-CM | POA: Diagnosis not present

## 2023-05-28 DIAGNOSIS — K269 Duodenal ulcer, unspecified as acute or chronic, without hemorrhage or perforation: Secondary | ICD-10-CM | POA: Diagnosis not present

## 2023-05-28 DIAGNOSIS — R1012 Left upper quadrant pain: Secondary | ICD-10-CM | POA: Diagnosis not present

## 2023-06-03 DIAGNOSIS — M0609 Rheumatoid arthritis without rheumatoid factor, multiple sites: Secondary | ICD-10-CM | POA: Diagnosis not present

## 2023-06-03 DIAGNOSIS — R5383 Other fatigue: Secondary | ICD-10-CM | POA: Diagnosis not present

## 2023-06-03 DIAGNOSIS — E669 Obesity, unspecified: Secondary | ICD-10-CM | POA: Diagnosis not present

## 2023-06-03 DIAGNOSIS — Z79899 Other long term (current) drug therapy: Secondary | ICD-10-CM | POA: Diagnosis not present

## 2023-06-03 DIAGNOSIS — M1120 Other chondrocalcinosis, unspecified site: Secondary | ICD-10-CM | POA: Diagnosis not present

## 2023-06-03 DIAGNOSIS — K253 Acute gastric ulcer without hemorrhage or perforation: Secondary | ICD-10-CM | POA: Diagnosis not present

## 2023-06-03 DIAGNOSIS — Z6830 Body mass index (BMI) 30.0-30.9, adult: Secondary | ICD-10-CM | POA: Diagnosis not present

## 2023-06-03 DIAGNOSIS — M1991 Primary osteoarthritis, unspecified site: Secondary | ICD-10-CM | POA: Diagnosis not present

## 2023-06-03 DIAGNOSIS — M797 Fibromyalgia: Secondary | ICD-10-CM | POA: Diagnosis not present

## 2023-07-01 DIAGNOSIS — K269 Duodenal ulcer, unspecified as acute or chronic, without hemorrhage or perforation: Secondary | ICD-10-CM | POA: Diagnosis not present

## 2023-07-01 DIAGNOSIS — R110 Nausea: Secondary | ICD-10-CM | POA: Diagnosis not present

## 2023-07-06 DIAGNOSIS — E559 Vitamin D deficiency, unspecified: Secondary | ICD-10-CM | POA: Diagnosis not present

## 2023-07-06 DIAGNOSIS — K317 Polyp of stomach and duodenum: Secondary | ICD-10-CM | POA: Diagnosis not present

## 2023-07-06 DIAGNOSIS — E039 Hypothyroidism, unspecified: Secondary | ICD-10-CM | POA: Diagnosis not present

## 2023-07-06 DIAGNOSIS — I10 Essential (primary) hypertension: Secondary | ICD-10-CM | POA: Diagnosis not present

## 2023-07-06 DIAGNOSIS — I700 Atherosclerosis of aorta: Secondary | ICD-10-CM | POA: Diagnosis not present

## 2023-07-06 DIAGNOSIS — M0600 Rheumatoid arthritis without rheumatoid factor, unspecified site: Secondary | ICD-10-CM | POA: Diagnosis not present

## 2023-07-06 DIAGNOSIS — M797 Fibromyalgia: Secondary | ICD-10-CM | POA: Diagnosis not present

## 2023-07-06 DIAGNOSIS — E785 Hyperlipidemia, unspecified: Secondary | ICD-10-CM | POA: Diagnosis not present

## 2023-07-06 DIAGNOSIS — M5000 Cervical disc disorder with myelopathy, unspecified cervical region: Secondary | ICD-10-CM | POA: Diagnosis not present

## 2023-07-06 DIAGNOSIS — K581 Irritable bowel syndrome with constipation: Secondary | ICD-10-CM | POA: Diagnosis not present

## 2023-07-06 DIAGNOSIS — D509 Iron deficiency anemia, unspecified: Secondary | ICD-10-CM | POA: Diagnosis not present

## 2023-07-28 DIAGNOSIS — M1991 Primary osteoarthritis, unspecified site: Secondary | ICD-10-CM | POA: Diagnosis not present

## 2023-07-28 DIAGNOSIS — E669 Obesity, unspecified: Secondary | ICD-10-CM | POA: Diagnosis not present

## 2023-07-28 DIAGNOSIS — Z79899 Other long term (current) drug therapy: Secondary | ICD-10-CM | POA: Diagnosis not present

## 2023-07-28 DIAGNOSIS — M1120 Other chondrocalcinosis, unspecified site: Secondary | ICD-10-CM | POA: Diagnosis not present

## 2023-07-28 DIAGNOSIS — M797 Fibromyalgia: Secondary | ICD-10-CM | POA: Diagnosis not present

## 2023-07-28 DIAGNOSIS — M0609 Rheumatoid arthritis without rheumatoid factor, multiple sites: Secondary | ICD-10-CM | POA: Diagnosis not present

## 2023-07-28 DIAGNOSIS — Z6830 Body mass index (BMI) 30.0-30.9, adult: Secondary | ICD-10-CM | POA: Diagnosis not present

## 2023-08-27 DIAGNOSIS — D72819 Decreased white blood cell count, unspecified: Secondary | ICD-10-CM | POA: Diagnosis not present

## 2023-09-04 DIAGNOSIS — I519 Heart disease, unspecified: Secondary | ICD-10-CM | POA: Diagnosis not present

## 2023-09-04 DIAGNOSIS — Z0000 Encounter for general adult medical examination without abnormal findings: Secondary | ICD-10-CM | POA: Diagnosis not present

## 2023-09-04 DIAGNOSIS — I10 Essential (primary) hypertension: Secondary | ICD-10-CM | POA: Diagnosis not present

## 2023-09-04 DIAGNOSIS — M797 Fibromyalgia: Secondary | ICD-10-CM | POA: Diagnosis not present

## 2023-09-04 DIAGNOSIS — E039 Hypothyroidism, unspecified: Secondary | ICD-10-CM | POA: Diagnosis not present

## 2023-09-22 DIAGNOSIS — N182 Chronic kidney disease, stage 2 (mild): Secondary | ICD-10-CM | POA: Diagnosis not present

## 2023-09-22 DIAGNOSIS — Z0000 Encounter for general adult medical examination without abnormal findings: Secondary | ICD-10-CM | POA: Diagnosis not present

## 2023-09-22 DIAGNOSIS — E039 Hypothyroidism, unspecified: Secondary | ICD-10-CM | POA: Diagnosis not present

## 2023-09-22 DIAGNOSIS — I350 Nonrheumatic aortic (valve) stenosis: Secondary | ICD-10-CM | POA: Diagnosis not present

## 2023-09-22 DIAGNOSIS — I519 Heart disease, unspecified: Secondary | ICD-10-CM | POA: Diagnosis not present

## 2023-09-22 DIAGNOSIS — I10 Essential (primary) hypertension: Secondary | ICD-10-CM | POA: Diagnosis not present

## 2023-09-22 DIAGNOSIS — M0600 Rheumatoid arthritis without rheumatoid factor, unspecified site: Secondary | ICD-10-CM | POA: Diagnosis not present

## 2023-09-22 DIAGNOSIS — M797 Fibromyalgia: Secondary | ICD-10-CM | POA: Diagnosis not present

## 2023-10-14 ENCOUNTER — Other Ambulatory Visit: Payer: Self-pay | Admitting: Internal Medicine

## 2023-10-14 DIAGNOSIS — R911 Solitary pulmonary nodule: Secondary | ICD-10-CM

## 2023-10-14 DIAGNOSIS — T8484XA Pain due to internal orthopedic prosthetic devices, implants and grafts, initial encounter: Secondary | ICD-10-CM | POA: Diagnosis not present

## 2023-10-14 DIAGNOSIS — M21612 Bunion of left foot: Secondary | ICD-10-CM | POA: Diagnosis not present

## 2023-10-14 DIAGNOSIS — G5761 Lesion of plantar nerve, right lower limb: Secondary | ICD-10-CM | POA: Diagnosis not present

## 2023-10-14 DIAGNOSIS — M21611 Bunion of right foot: Secondary | ICD-10-CM | POA: Diagnosis not present

## 2023-10-20 DIAGNOSIS — Z780 Asymptomatic menopausal state: Secondary | ICD-10-CM | POA: Diagnosis not present

## 2023-10-20 DIAGNOSIS — Z01419 Encounter for gynecological examination (general) (routine) without abnormal findings: Secondary | ICD-10-CM | POA: Diagnosis not present

## 2023-10-20 DIAGNOSIS — Z1231 Encounter for screening mammogram for malignant neoplasm of breast: Secondary | ICD-10-CM | POA: Diagnosis not present

## 2023-10-27 DIAGNOSIS — M797 Fibromyalgia: Secondary | ICD-10-CM | POA: Diagnosis not present

## 2023-10-27 DIAGNOSIS — D72819 Decreased white blood cell count, unspecified: Secondary | ICD-10-CM | POA: Diagnosis not present

## 2023-10-27 DIAGNOSIS — Z6830 Body mass index (BMI) 30.0-30.9, adult: Secondary | ICD-10-CM | POA: Diagnosis not present

## 2023-10-27 DIAGNOSIS — M1120 Other chondrocalcinosis, unspecified site: Secondary | ICD-10-CM | POA: Diagnosis not present

## 2023-10-27 DIAGNOSIS — E669 Obesity, unspecified: Secondary | ICD-10-CM | POA: Diagnosis not present

## 2023-10-27 DIAGNOSIS — Z79899 Other long term (current) drug therapy: Secondary | ICD-10-CM | POA: Diagnosis not present

## 2023-10-27 DIAGNOSIS — M1991 Primary osteoarthritis, unspecified site: Secondary | ICD-10-CM | POA: Diagnosis not present

## 2023-10-27 DIAGNOSIS — M0609 Rheumatoid arthritis without rheumatoid factor, multiple sites: Secondary | ICD-10-CM | POA: Diagnosis not present

## 2023-10-28 ENCOUNTER — Ambulatory Visit
Admission: RE | Admit: 2023-10-28 | Discharge: 2023-10-28 | Disposition: A | Discharge status: Home / Self Care | Source: Ambulatory Visit | Attending: Internal Medicine | Admitting: Internal Medicine

## 2023-10-28 DIAGNOSIS — R918 Other nonspecific abnormal finding of lung field: Secondary | ICD-10-CM | POA: Diagnosis not present

## 2023-10-28 DIAGNOSIS — R911 Solitary pulmonary nodule: Secondary | ICD-10-CM

## 2023-10-28 DIAGNOSIS — I700 Atherosclerosis of aorta: Secondary | ICD-10-CM | POA: Diagnosis not present

## 2024-01-04 DIAGNOSIS — D509 Iron deficiency anemia, unspecified: Secondary | ICD-10-CM | POA: Diagnosis not present

## 2024-01-04 DIAGNOSIS — E785 Hyperlipidemia, unspecified: Secondary | ICD-10-CM | POA: Diagnosis not present

## 2024-01-04 DIAGNOSIS — R109 Unspecified abdominal pain: Secondary | ICD-10-CM | POA: Diagnosis not present

## 2024-01-04 DIAGNOSIS — M5000 Cervical disc disorder with myelopathy, unspecified cervical region: Secondary | ICD-10-CM | POA: Diagnosis not present

## 2024-01-04 DIAGNOSIS — I700 Atherosclerosis of aorta: Secondary | ICD-10-CM | POA: Diagnosis not present

## 2024-01-04 DIAGNOSIS — K317 Polyp of stomach and duodenum: Secondary | ICD-10-CM | POA: Diagnosis not present

## 2024-01-04 DIAGNOSIS — I10 Essential (primary) hypertension: Secondary | ICD-10-CM | POA: Diagnosis not present

## 2024-01-04 DIAGNOSIS — E039 Hypothyroidism, unspecified: Secondary | ICD-10-CM | POA: Diagnosis not present

## 2024-01-04 DIAGNOSIS — M545 Low back pain, unspecified: Secondary | ICD-10-CM | POA: Diagnosis not present

## 2024-01-04 DIAGNOSIS — K581 Irritable bowel syndrome with constipation: Secondary | ICD-10-CM | POA: Diagnosis not present

## 2024-01-04 DIAGNOSIS — M0600 Rheumatoid arthritis without rheumatoid factor, unspecified site: Secondary | ICD-10-CM | POA: Diagnosis not present

## 2024-01-04 DIAGNOSIS — R918 Other nonspecific abnormal finding of lung field: Secondary | ICD-10-CM | POA: Diagnosis not present

## 2024-02-04 DIAGNOSIS — M1991 Primary osteoarthritis, unspecified site: Secondary | ICD-10-CM | POA: Diagnosis not present

## 2024-02-04 DIAGNOSIS — M1120 Other chondrocalcinosis, unspecified site: Secondary | ICD-10-CM | POA: Diagnosis not present

## 2024-02-04 DIAGNOSIS — D72819 Decreased white blood cell count, unspecified: Secondary | ICD-10-CM | POA: Diagnosis not present

## 2024-02-04 DIAGNOSIS — M0609 Rheumatoid arthritis without rheumatoid factor, multiple sites: Secondary | ICD-10-CM | POA: Diagnosis not present

## 2024-02-04 DIAGNOSIS — E669 Obesity, unspecified: Secondary | ICD-10-CM | POA: Diagnosis not present

## 2024-02-04 DIAGNOSIS — Z6830 Body mass index (BMI) 30.0-30.9, adult: Secondary | ICD-10-CM | POA: Diagnosis not present

## 2024-02-04 DIAGNOSIS — Z79899 Other long term (current) drug therapy: Secondary | ICD-10-CM | POA: Diagnosis not present

## 2024-02-04 DIAGNOSIS — M797 Fibromyalgia: Secondary | ICD-10-CM | POA: Diagnosis not present

## 2024-02-05 ENCOUNTER — Other Ambulatory Visit (HOSPITAL_COMMUNITY): Payer: Self-pay | Admitting: Endocrinology

## 2024-02-05 DIAGNOSIS — Z87442 Personal history of urinary calculi: Secondary | ICD-10-CM

## 2024-02-05 DIAGNOSIS — K581 Irritable bowel syndrome with constipation: Secondary | ICD-10-CM

## 2024-02-05 DIAGNOSIS — R109 Unspecified abdominal pain: Secondary | ICD-10-CM

## 2024-02-06 ENCOUNTER — Ambulatory Visit (HOSPITAL_BASED_OUTPATIENT_CLINIC_OR_DEPARTMENT_OTHER)
Admission: RE | Admit: 2024-02-06 | Discharge: 2024-02-06 | Disposition: A | Discharge status: Home / Self Care | Source: Ambulatory Visit | Attending: Endocrinology | Admitting: Endocrinology

## 2024-02-06 DIAGNOSIS — Z87442 Personal history of urinary calculi: Secondary | ICD-10-CM | POA: Insufficient documentation

## 2024-02-06 DIAGNOSIS — R109 Unspecified abdominal pain: Secondary | ICD-10-CM | POA: Diagnosis not present

## 2024-02-06 DIAGNOSIS — K581 Irritable bowel syndrome with constipation: Secondary | ICD-10-CM | POA: Insufficient documentation

## 2024-02-06 DIAGNOSIS — K449 Diaphragmatic hernia without obstruction or gangrene: Secondary | ICD-10-CM | POA: Diagnosis not present

## 2024-02-06 DIAGNOSIS — Z90710 Acquired absence of both cervix and uterus: Secondary | ICD-10-CM | POA: Diagnosis not present

## 2024-02-06 MED ORDER — IOHEXOL 300 MG/ML  SOLN
100.0000 mL | Freq: Once | INTRAMUSCULAR | Status: AC | PRN
  Administered 2024-02-06: 100 mL via INTRAVENOUS

## 2024-03-03 DIAGNOSIS — R002 Palpitations: Secondary | ICD-10-CM | POA: Diagnosis not present

## 2024-03-03 DIAGNOSIS — R0602 Shortness of breath: Secondary | ICD-10-CM | POA: Diagnosis not present

## 2024-03-03 DIAGNOSIS — R42 Dizziness and giddiness: Secondary | ICD-10-CM | POA: Diagnosis not present

## 2024-03-03 DIAGNOSIS — R000 Tachycardia, unspecified: Secondary | ICD-10-CM | POA: Diagnosis not present

## 2024-03-31 DIAGNOSIS — R000 Tachycardia, unspecified: Secondary | ICD-10-CM | POA: Diagnosis not present

## 2024-03-31 DIAGNOSIS — R0600 Dyspnea, unspecified: Secondary | ICD-10-CM | POA: Diagnosis not present

## 2024-04-01 DIAGNOSIS — I471 Supraventricular tachycardia, unspecified: Secondary | ICD-10-CM | POA: Diagnosis not present

## 2024-04-01 DIAGNOSIS — F329 Major depressive disorder, single episode, unspecified: Secondary | ICD-10-CM | POA: Diagnosis not present

## 2024-04-04 DIAGNOSIS — R000 Tachycardia, unspecified: Secondary | ICD-10-CM | POA: Diagnosis not present

## 2024-04-08 ENCOUNTER — Ambulatory Visit: Attending: Internal Medicine | Admitting: Internal Medicine

## 2024-04-08 VITALS — BP 122/72 | HR 58 | Resp 16 | Ht 62.0 in | Wt 191.1 lb

## 2024-04-08 DIAGNOSIS — R0609 Other forms of dyspnea: Secondary | ICD-10-CM | POA: Diagnosis not present

## 2024-04-08 DIAGNOSIS — Z01812 Encounter for preprocedural laboratory examination: Secondary | ICD-10-CM | POA: Diagnosis not present

## 2024-04-08 DIAGNOSIS — I471 Supraventricular tachycardia, unspecified: Secondary | ICD-10-CM | POA: Diagnosis not present

## 2024-04-08 NOTE — Progress Notes (Signed)
 HPI [name] is referred for evaluation of SVT. She is a pleasant 68 yo woman with a h/o palpitations and sob. She wore a cardiac monitor demonstrating very brief episodes of SVT of only a few seconds. She was placed on metoprolol 25 bid. Her symptoms are much improved. Her main comlaint today was exertional dyspnea. She denies anginal symptoms. She has very bad RA and is on leflunomide . No syncope.  Allergies  Allergen Reactions   Prednisone Other (See Comments)    Chest pain   Clindamycin/Lincomycin Rash   Influenza Vaccines Hives   Lodine [Etodolac] Rash     Current Outpatient Medications  Medication Sig Dispense Refill   acetaminophen  (TYLENOL ) 650 MG CR tablet Take 1,300 mg by mouth every 8 (eight) hours as needed for pain.     ALPRAZolam  (XANAX ) 0.25 MG tablet Take 0.25 mg by mouth at bedtime as needed for sleep.     buPROPion  (WELLBUTRIN  XL) 150 MG 24 hr tablet Take 450 mg by mouth daily.     hydroxychloroquine (PLAQUENIL) 200 MG tablet Take 200 mg by mouth 2 (two) times daily.     leflunomide  (ARAVA ) 20 MG tablet Take 20 mg by mouth at bedtime.     levothyroxine  (SYNTHROID ) 200 MCG tablet Take 200 mcg by mouth See admin instructions. Take 200 mcg at bedtime except on Saturday take 100 mcg. Skip dose on Sundays.     losartan -hydrochlorothiazide  (HYZAAR) 100-12.5 MG per tablet Take 1 tablet by mouth every morning.      meclizine  (ANTIVERT ) 25 MG tablet Take 25 mg by mouth 2 (two) times daily as needed for dizziness.     methocarbamol  (ROBAXIN ) 500 MG tablet Take 1 tablet (500 mg total) by mouth every 6 (six) hours as needed for muscle spasms. 30 tablet 3   omeprazole (PRILOSEC) 20 MG capsule Take 20 mg by mouth daily.     pramipexole  (MIRAPEX ) 0.125 MG tablet Take 0.125 mg by mouth 3 (three) times daily.     senna (SENOKOT) 8.6 MG tablet Take 1 tablet by mouth daily. All vegatable     traMADol  (ULTRAM ) 50 MG tablet Take 50-100 mg by mouth every 6 (six) hours as needed for  pain.     Vitamin D, Ergocalciferol, (DRISDOL) 1.25 MG (50000 UNIT) CAPS capsule Take 50,000 Units by mouth every Sunday.     No current facility-administered medications for this visit.     Past Medical History:  Diagnosis Date   Anxiety    Arthritis    Complication of anesthesia    during surgery she could hear staff talking and could feel them preping her arm over 30 yrs. ago   Dysrhythmia    Patient states back in July she was told by PCP that she had an irregular heart rhythm, but was not told what it was.   Fibromyalgia    GERD (gastroesophageal reflux disease)    Heart murmur    Hypertension    Hypothyroidism    Restless leg syndrome    Staph infection    2015   Wears glasses     ROS:   All systems reviewed and negative except as noted in the HPI.   Past Surgical History:  Procedure Laterality Date   ABDOMINAL HYSTERECTOMY  1981   partial   ANTERIOR LAT LUMBAR FUSION N/A 06/28/2020   Procedure: Thoracic Twelve to Lumbar One, Lumbar One-Two; Anterior lateral fusion, pedicle screws Thoracic Ten to Lumbar Three, Laminectomy Thoracic Twelve--Lumbar Two;  Surgeon: Colon Shove, MD;  Location: [facility] OR;  Service: Neurosurgery;  Laterality: N/A;   APPLICATION OF ROBOTIC ASSISTANCE FOR SPINAL PROCEDURE N/A 06/28/2020   Procedure: APPLICATION OF ROBOTIC ASSISTANCE FOR SPINAL PROCEDURE;  Surgeon: Colon Shove, MD;  Location: [facility] OR;  Service: Neurosurgery;  Laterality: N/A;   APPLICATION OF WOUND VAC N/A 04/25/2014   Procedure: APPLICATION OF WOUND VAC;  Surgeon: Lamar LELON Peaches, MD;  Location: [facility] NEURO ORS;  Service: Neurosurgery;  Laterality: N/A;   BACK SURGERY  2007   BLADDER SUSPENSION     2004   CERVICAL FUSION  1990   CERVICAL FUSION     2000   CYST EXCISION Left 1985   wrist   CYST EXCISION  1985   chest   FOOT SURGERY Right 2009   reconstruction with hardware   FOOT SURGERY Left 2013   reconstruction with hardware   LUMBAR FUSION  03/2014   LUMBAR PERCUTANEOUS  PEDICLE SCREW 4 LEVEL N/A 06/28/2020   Procedure: LUMBAR PERCUTANEOUS PEDICLE SCREW Thoracic Ten to Lumbar Three, Laminectomy Thoracic Twelve--Lumbar Two;  Surgeon: Colon Shove, MD;  Location: [facility] OR;  Service: Neurosurgery;  Laterality: N/A;  posterior   LUMBAR WOUND DEBRIDEMENT N/A 04/25/2014   Procedure: LUMBAR WOUND DEBRIDEMENT Placement of Woundvac);  Surgeon: Lamar LELON Peaches, MD;  Location: [facility]UR[facility] OR[facility];  Service: Neurosurgery;  Laterality: N/A;   PICC LINE PLACE PERIPHERAL ([facility] HX)     2015-infusion therapy secondary to STAPH infection    POSTERIOR CERVICAL LAMINECTOMY WITH MET- RX Left 04/25/2021   Procedure: Cervical seven-Thoracic one; Thoracic one-two Left Laminectomy/Foraminotomy with metrix;  Surgeon: Colon Shove, MD;  Location: [facility] OR;  Service: Neurosurgery;  Laterality: Left;   TONSILLECTOMY     TOTAL KNEE ARTHROPLASTY Bilateral 02/27/2015   Procedure: RIGHT TOTAL KNEE ARTHROPLASTY, LEFT KNEE CORTISONE INJECTION;  Surgeon: Donnice Car, MD;  Location: [facility] ORS;  Service: Orthopedics;  Laterality: Bilateral;   TOTAL KNEE ARTHROPLASTY Left 06/19/2015   Procedure: LEFT TOTAL KNEE ARTHROPLASTY;  Surgeon: Donnice Car, MD;  Location: [facility] ORS;  Service: Orthopedics;  Laterality: Left;   TUBAL LIGATION       History reviewed. No pertinent family history.   Social History   Socioeconomic History   Marital status: Married    Spouse name: Not on file   Number of children: Not on file   Years of education: Not on file   Highest education level: Not on file  Occupational History   Not on file  Tobacco Use   Smoking status: Never   Smokeless tobacco: Never  Vaping Use   Vaping status: Never Used  Substance and Sexual Activity   Alcohol use: No    Alcohol/week: 0.0 standard drinks of alcohol   Drug use: No   Sexual activity: Not on file  Other Topics Concern   Not on file  Social History Narrative   Not on file   Social Drivers of Health   Financial Resource Strain: Not  on file  Food Insecurity: Not on file  Transportation Needs: Not on file  Physical Activity: Not on file  Stress: Not on file  Social Connections: Not on file  Intimate Partner Violence: Not on file     BP 122/72 (BP Location: Left Arm, Patient Position: Sitting, Cuff Size: Large)   Pulse (!) 58   Resp 16   Ht 5' 2 (1.575 m)   Wt 191 lb 1.6 oz (86.7 kg)   SpO2 100%   BMI 34.95 kg/m  Physical Exam:  Well appearing NAD HEENT: Unremarkable Neck:  No JVD, no thyromegally Lymphatics:  No adenopathy Back:  No CVA tenderness Lungs:  Clear with no wheezes HEART:  Regular rate rhythm, no murmurs, no rubs, no clicks Abd:  soft, positive bowel sounds, no organomegally, no rebound, no guarding Ext:  2 plus pulses, no edema, no cyanosis, no clubbing Skin:  No rashes no nodules Neuro:  CN II through XII intact, motor grossly intact  EKG - NSR with PAC's.   Assess/Plan: SVT - her symptoms are much improved and her monitor shows only brief episodes. No additional treatment is recommended.  Dyspnea with exertion -I am concerned that this is her anginal equivalent and have recommended she undergo a coronary CT angio.   Danelle Rolla Servidio,MD

## 2024-04-08 NOTE — Patient Instructions (Addendum)
 Medication Instructions:  Your physician recommends that you continue on your current medications as directed. Please refer to the Current Medication list given to you today.  *If you need a refill on your cardiac medications before your next appointment, please call your pharmacy*  Lab Work: [facility] before your testing  You may go to any [facility] Location for your lab work:  [facility] - [address] ([facility]) - [address] [phone] [address]  [place] - [address]   [place]  - [address]   [place] - [address] - [address] [employer]  - [address] ([facility]   If you have labs (blood work) drawn today and your tests are completely normal, you will receive your results only by: [employer] (if you have MyChart)  If you have any lab test that is abnormal or we need to change your treatment, we will call you or send a MyChart message to review the results.  Testing/Procedures:   Your cardiac CT will be scheduled at one of the below locations:    [facility] [address]  [place], [place] [address]  Please enter the parking lot using the [address] entrance and use the FREE valet service at the patient drop-off area. Enter the building and check-in with registration on the main floor.   Please follow these instructions carefully (unless otherwise directed):  An IV will be required for this test and Nitroglycerin will be given.   On the Night Before the Test: Be sure to Drink plenty of water . Do not consume any caffeinated/decaffeinated beverages or chocolate 12 hours prior to your test. Do not take any antihistamines 12 hours prior to your test.   On the Day of the Test: Drink plenty of water  until 1 hour prior to the test. Do not eat any food 1 hour prior to test. You  may take your regular medications prior to the test.  Take metoprolol (Lopressor) two hours prior to test. If you take Furosemide/Hydrochlorothiazide /Spironolactone/Chlorthalidone, please HOLD on the morning of the test. Patients who wear a continuous glucose monitor MUST remove the device prior to scanning. FEMALES- please wear underwire-free bra if available, avoid dresses & tight clothing        After the Test: Drink plenty of water . After receiving IV contrast, you may experience a mild flushed feeling. This is normal. On occasion, you may experience a mild rash up to 24 hours after the test. This is not dangerous. If this occurs, you can take Benadryl  25 mg, Zyrtec, Claritin , or Allegra and increase your fluid intake. (Patients taking Tikosyn should avoid Benadryl , and may take Zyrtec, Claritin , or Allegra) If you experience trouble breathing, this can be serious. If it is severe call 911 IMMEDIATELY. If it is mild, please call our office.  We will call to schedule your test 2-4 weeks out understanding that some insurance companies will need an authorization prior to the service being performed.   For more information and frequently asked questions, please visit our website : [email or web address]  For non-scheduling related questions, please contact the cardiac imaging nurse navigator should you have any questions/concerns: Cardiac Imaging Nurse Navigators Direct Office Dial: [phone]   For scheduling needs, including cancellations and rescheduling, please call [place], [phone].   Follow-Up: At [facility], you and  your health needs are our priority.  As part of our continuing mission to provide you with exceptional heart care, we have created designated Provider Care Teams.  These Care Teams include your primary Cardiologist (physician) and Advanced Practice Providers (APPs -  Physician Assistants and Nurse Practitioners) who all work together to provide you  with the care you need, when you need it.   Your next appointment:   To be determined

## 2024-04-13 DIAGNOSIS — I471 Supraventricular tachycardia, unspecified: Secondary | ICD-10-CM | POA: Diagnosis not present

## 2024-04-14 DIAGNOSIS — I471 Supraventricular tachycardia, unspecified: Secondary | ICD-10-CM | POA: Diagnosis not present

## 2024-04-14 DIAGNOSIS — R0609 Other forms of dyspnea: Secondary | ICD-10-CM | POA: Diagnosis not present

## 2024-04-14 DIAGNOSIS — Z01812 Encounter for preprocedural laboratory examination: Secondary | ICD-10-CM | POA: Diagnosis not present

## 2024-04-15 LAB — BASIC METABOLIC PANEL WITH GFR
BUN/Creatinine Ratio: 18 (ref 12–28)
BUN: 17 mg/dL (ref 8–27)
CO2: 21 mmol/L (ref 20–29)
Calcium: 9.6 mg/dL (ref 8.7–10.3)
Chloride: 102 mmol/L (ref 96–106)
Creatinine, Ser: 0.96 mg/dL (ref 0.57–1.00)
Glucose: 81 mg/dL (ref 70–99)
Potassium: 4.4 mmol/L (ref 3.5–5.2)
Sodium: 138 mmol/L (ref 134–144)
eGFR: 64 mL/min/1.73 (ref >59)

## 2024-04-18 DIAGNOSIS — I270 Primary pulmonary hypertension: Secondary | ICD-10-CM | POA: Diagnosis not present

## 2024-04-18 DIAGNOSIS — I471 Supraventricular tachycardia, unspecified: Secondary | ICD-10-CM | POA: Diagnosis not present

## 2024-04-19 ENCOUNTER — Encounter (HOSPITAL_COMMUNITY): Payer: Self-pay

## 2024-04-21 ENCOUNTER — Ambulatory Visit (HOSPITAL_COMMUNITY)
Admission: RE | Admit: 2024-04-21 | Discharge: 2024-04-21 | Disposition: A | Discharge status: Home / Self Care | Source: Ambulatory Visit | Attending: Internal Medicine | Admitting: Internal Medicine

## 2024-04-21 DIAGNOSIS — I471 Supraventricular tachycardia, unspecified: Secondary | ICD-10-CM

## 2024-04-21 DIAGNOSIS — I2510 Atherosclerotic heart disease of native coronary artery without angina pectoris: Secondary | ICD-10-CM | POA: Diagnosis not present

## 2024-04-21 DIAGNOSIS — R0609 Other forms of dyspnea: Secondary | ICD-10-CM | POA: Diagnosis not present

## 2024-04-21 DIAGNOSIS — I3481 Nonrheumatic mitral (valve) annulus calcification: Secondary | ICD-10-CM | POA: Insufficient documentation

## 2024-04-21 DIAGNOSIS — I2584 Coronary atherosclerosis due to calcified coronary lesion: Secondary | ICD-10-CM | POA: Insufficient documentation

## 2024-04-21 DIAGNOSIS — I358 Other nonrheumatic aortic valve disorders: Secondary | ICD-10-CM | POA: Insufficient documentation

## 2024-04-21 MED ORDER — NITROGLYCERIN 0.4 MG SL SUBL
0.8000 mg | SUBLINGUAL_TABLET | Freq: Once | SUBLINGUAL | Status: AC
  Administered 2024-04-21: 0.8 mg via SUBLINGUAL

## 2024-04-21 MED ORDER — IOHEXOL 350 MG/ML SOLN
100.0000 mL | Freq: Once | INTRAVENOUS | Status: AC | PRN
Start: 2024-04-21 — End: 2024-04-21
  Administered 2024-04-21: 100 mL via INTRAVENOUS

## 2024-04-26 ENCOUNTER — Telehealth: Payer: Self-pay | Admitting: Internal Medicine

## 2024-04-26 NOTE — Telephone Encounter (Signed)
Patient is requesting a call back to review CT results.

## 2024-04-26 NOTE — Telephone Encounter (Signed)
 Spoke to patient she was calling for Coronary CT results.Stated she continues to have sob.She wanted to know results and if she needs to schedule follow up appointment with Dr.Taylor.Advised I will send message to Dr.Taylor for advice.

## 2024-04-29 ENCOUNTER — Ambulatory Visit: Payer: Self-pay | Admitting: Internal Medicine

## 2024-04-29 ENCOUNTER — Encounter: Payer: Self-pay | Admitting: Internal Medicine

## 2024-04-29 ENCOUNTER — Other Ambulatory Visit: Payer: Self-pay

## 2024-04-29 DIAGNOSIS — R0609 Other forms of dyspnea: Secondary | ICD-10-CM

## 2024-04-29 DIAGNOSIS — I471 Supraventricular tachycardia, unspecified: Secondary | ICD-10-CM

## 2024-04-29 DIAGNOSIS — Z79899 Other long term (current) drug therapy: Secondary | ICD-10-CM

## 2024-04-29 MED ORDER — ROSUVASTATIN CALCIUM 10 MG PO TABS: 10.0000 mg | ORAL_TABLET | Freq: Every day | ORAL | 3 refills | Status: DC

## 2024-04-29 NOTE — Telephone Encounter (Signed)
 This has been addressed in a MyChart message. See encounter 04/29/24

## 2024-05-09 ENCOUNTER — Encounter: Payer: Self-pay | Admitting: Internal Medicine

## 2024-05-11 DIAGNOSIS — E669 Obesity, unspecified: Secondary | ICD-10-CM | POA: Diagnosis not present

## 2024-05-11 DIAGNOSIS — Z6831 Body mass index (BMI) 31.0-31.9, adult: Secondary | ICD-10-CM | POA: Diagnosis not present

## 2024-05-11 DIAGNOSIS — Z79899 Other long term (current) drug therapy: Secondary | ICD-10-CM | POA: Diagnosis not present

## 2024-05-11 DIAGNOSIS — M1991 Primary osteoarthritis, unspecified site: Secondary | ICD-10-CM | POA: Diagnosis not present

## 2024-05-11 DIAGNOSIS — D72819 Decreased white blood cell count, unspecified: Secondary | ICD-10-CM | POA: Diagnosis not present

## 2024-05-11 DIAGNOSIS — M0609 Rheumatoid arthritis without rheumatoid factor, multiple sites: Secondary | ICD-10-CM | POA: Diagnosis not present

## 2024-05-11 DIAGNOSIS — M797 Fibromyalgia: Secondary | ICD-10-CM | POA: Diagnosis not present

## 2024-05-11 DIAGNOSIS — M1120 Other chondrocalcinosis, unspecified site: Secondary | ICD-10-CM | POA: Diagnosis not present

## 2024-06-07 ENCOUNTER — Telehealth: Payer: Self-pay | Admitting: Internal Medicine

## 2024-06-07 NOTE — Telephone Encounter (Signed)
 Paper Work Dropped Off:     Copy of echo from [facility]  Date:   06-07-24  Location of paper:  Dr Adrian mailbox

## 2024-06-09 NOTE — Telephone Encounter (Signed)
 FYI: Neither Waddell or I will be at [address] office until 06/20/24

## 2024-06-21 ENCOUNTER — Other Ambulatory Visit: Payer: Self-pay

## 2024-06-21 DIAGNOSIS — I471 Supraventricular tachycardia, unspecified: Secondary | ICD-10-CM

## 2024-06-21 DIAGNOSIS — R0609 Other forms of dyspnea: Secondary | ICD-10-CM

## 2024-06-21 DIAGNOSIS — Z79899 Other long term (current) drug therapy: Secondary | ICD-10-CM

## 2024-06-21 LAB — HEPATIC FUNCTION PANEL
ALT: 22 IU/L (ref 0–32)
AST: 24 IU/L (ref 0–40)
Albumin: 4.2 g/dL (ref 3.9–4.9)
Alkaline Phosphatase: 86 IU/L (ref 49–135)
Bilirubin Total: 0.5 mg/dL (ref 0.0–1.2)
Bilirubin, Direct: 0.18 mg/dL (ref 0.00–0.40)
Total Protein: 6.3 g/dL (ref 6.0–8.5)

## 2024-06-21 LAB — LIPID PANEL
Chol/HDL Ratio: 2.3 ratio (ref 0.0–4.4)
Cholesterol, Total: 172 mg/dL (ref 100–199)
HDL: 74 mg/dL
LDL Chol Calc (NIH): 78 mg/dL (ref 0–99)
Triglycerides: 117 mg/dL (ref 0–149)
VLDL Cholesterol Cal: 20 mg/dL (ref 5–40)

## 2024-06-21 LAB — TSH: TSH: 0.505 u[IU]/mL (ref 0.450–4.500)

## 2024-06-22 ENCOUNTER — Ambulatory Visit: Payer: Self-pay | Admitting: Internal Medicine

## 2024-07-04 ENCOUNTER — Telehealth (HOSPITAL_BASED_OUTPATIENT_CLINIC_OR_DEPARTMENT_OTHER): Payer: Self-pay | Admitting: *Deleted

## 2024-07-04 NOTE — Telephone Encounter (Signed)
"  ° °  Pre-operative Risk Assessment    Patient Name: [name]  DOB: August 20, 1955 MRN: [id number]   Date of last office visit: 04/08/2024 Date of next office visit: None  Request for Surgical Clearance    Procedure:  Colonoscopy  Date of Surgery:  Clearance 07/07/24                                 Surgeon:  Dr. Belvie Just Surgeon's Group or Practice Name:  [facility] Phone number:  [phone] Fax number:  [phone]   Type of Clearance Requested:   - Medical    Type of Anesthesia:  Propofol    Additional requests/questions:    Signed, Edsel Grayce Sanders   07/04/2024, 4:08 PM   "

## 2024-07-04 NOTE — Telephone Encounter (Signed)
 Called and left the patient a detailed message to call back and schedule pre-op clearance. 1st attempt.

## 2024-07-04 NOTE — Telephone Encounter (Signed)
" ° °  Name: [name]  DOB: 01-19-1956  MRN: [id number]  Primary Cardiologist: None   Preoperative team, please contact this patient and set up a phone call appointment for further preoperative risk assessment. Please obtain consent and complete medication review. Thank you for your help.  I confirm that guidance regarding antiplatelet and oral anticoagulation therapy has been completed and, if necessary, noted below.  Had cardiac CTA: Per Dr. Waddell 04/21/2024 Let her know that her coronary CT angio shows no significant blockages. Her calcium  score is elevated and I would recommend starting crestor  10 mg daily.   I also confirmed the patient resides in the state of [place] . As per [facility] Medical Board telemedicine laws, the patient must reside in the state in which the provider is licensed.   Lamarr Satterfield, NP 07/04/2024, 4:13 PM [facility]    "

## 2024-07-05 ENCOUNTER — Telehealth: Payer: Self-pay

## 2024-07-05 NOTE — Telephone Encounter (Signed)
 Preop tele appt now scheduled, med rec and consent done. Aspirin  was also added to patient's med list FYI

## 2024-07-05 NOTE — Telephone Encounter (Signed)
 Patient was returning call. Please advise ?

## 2024-07-05 NOTE — Telephone Encounter (Signed)
"  °  Patient Consent for Virtual Visit        [name] has provided verbal consent on 07/05/2024 for a virtual visit (video or telephone).   CONSENT FOR VIRTUAL VISIT FOR:  [name]  By participating in this virtual visit I agree to the following:  I hereby voluntarily request, consent and authorize [facility] and its employed or contracted physicians, physician assistants, nurse practitioners or other licensed health care professionals (the Practitioner), to provide me with telemedicine health care services (the Services) as deemed necessary by the treating Practitioner. I acknowledge and consent to receive the Services by the Practitioner via telemedicine. I understand that the telemedicine visit will involve communicating with the Practitioner through live audiovisual communication technology and the disclosure of certain medical information by electronic transmission. I acknowledge that I have been given the opportunity to request an in-person assessment or other available alternative prior to the telemedicine visit and am voluntarily participating in the telemedicine visit.  I understand that I have the right to withhold or withdraw my consent to the use of telemedicine in the course of my care at any time, without affecting my right to future care or treatment, and that the Practitioner or I may terminate the telemedicine visit at any time. I understand that I have the right to inspect all information obtained and/or recorded in the course of the telemedicine visit and may receive copies of available information for a reasonable fee.  I understand that some of the potential risks of receiving the Services via telemedicine include:  Delay or interruption in medical evaluation due to technological equipment failure or disruption; Information transmitted may not be sufficient (e.g. poor resolution of images) to allow for appropriate medical decision making by the  Practitioner; and/or  In rare instances, security protocols could fail, causing a breach of personal health information.  Furthermore, I acknowledge that it is my responsibility to provide information about my medical history, conditions and care that is complete and accurate to the best of my ability. I acknowledge that Practitioner's advice, recommendations, and/or decision may be based on factors not within their control, such as incomplete or inaccurate data provided by me or distortions of diagnostic images or specimens that may result from electronic transmissions. I understand that the practice of medicine is not an exact science and that Practitioner makes no warranties or guarantees regarding treatment outcomes. I acknowledge that a copy of this consent can be made available to me via my patient portal [facility] MyChart), or I can request a printed copy by calling the office of [facility].    I understand that my insurance will be billed for this visit.   I have read or had this consent read to me. I understand the contents of this consent, which adequately explains the benefits and risks of the Services being provided via telemedicine.  I have been provided ample opportunity to ask questions regarding this consent and the Services and have had my questions answered to my satisfaction. I give my informed consent for the services to be provided through the use of telemedicine in my medical care    "

## 2024-07-06 ENCOUNTER — Ambulatory Visit: Attending: Cardiology

## 2024-07-06 DIAGNOSIS — Z01810 Encounter for preprocedural cardiovascular examination: Secondary | ICD-10-CM

## 2024-07-06 NOTE — Progress Notes (Signed)
 "   Virtual Visit via Telephone Note   Because of [name] co-morbid illnesses, she is at least at moderate risk for complications without adequate follow up.  This format is felt to be most appropriate for this patient at this time.  Due to technical limitations with video connection (technology), today's appointment will be conducted as an audio only telehealth visit, and [name] verbally agreed to proceed in this manner.   All issues noted in this document were discussed and addressed.  No physical exam could be performed with this format.  Evaluation Performed:  Preoperative cardiovascular risk assessment _____________   Date:  07/06/2024   Patient ID:  Brianna, Hardy 01/18/1956, MRN [id number] Patient Location:  Home Provider location:   Office  Primary Care Provider:  Verdia Lombard, MD Primary Cardiologist:  Soyla DELENA Merck, MD  Chief Complaint / Patient Profile   69 y.o. y/o female with a h/o aortic atherosclerosis, SVT, and RA who is pending colonoscopy 07/07/24 and presents today for telephonic preoperative cardiovascular risk assessment.  History of Present Illness    [name] is a 69 y.o. female who presents via audio/video conferencing for a telehealth visit today.  Pt was last seen in cardiology clinic on 04/08/2024 by Dr. Waddell. At that time [name] complained of dyspnea. Coronary CTA 04/21/2024 showed coronary calcium  score of 219 (84th percentile) with minimal plaque in the LAD and LCx. She was started on rosuvastatin . She also had an echocardiogram from her PCP on 04/13/2024, but she has not had these results relayed to her as of today.   The patient is now pending procedure as outlined above. Since her last visit, she has has continued to be very fatigued, particularly since she was started on metoprolol for palpitations. Her palpitations are significantly improved. Her activity level has decreased in the past several months, but  she is able to walk about halfway across [employer] while pushing a grocery cart without concerning cardiac symptoms. She has had some dizziness and pre-syncope with position changes, and she is trying to stay hydrated with four 16-ounce bottles of sparkling water  per day. Though decreased activity from prior, she is able to complete greater than 4 METS of activity without chest pain or worsened dyspnea.  Past Medical History    Past Medical History:  Diagnosis Date   Anxiety    Arthritis    Complication of anesthesia    during surgery she could hear staff talking and could feel them preping her arm over 30 yrs. ago   Dysrhythmia    Patient states back in July she was told by PCP that she had an irregular heart rhythm, but was not told what it was.   Fibromyalgia    GERD (gastroesophageal reflux disease)    Heart murmur    Hypertension    Hypothyroidism    Restless leg syndrome    Staph infection    2015   Wears glasses    Past Surgical History:  Procedure Laterality Date   ABDOMINAL HYSTERECTOMY  1981   partial   ANTERIOR LAT LUMBAR FUSION N/A 06/28/2020   Procedure: Thoracic Twelve to Lumbar One, Lumbar One-Two; Anterior lateral fusion, pedicle screws Thoracic Ten to Lumbar Three, Laminectomy Thoracic Twelve--Lumbar Two;  Surgeon: Colon Shove, MD;  Location: [facility] OR;  Service: Neurosurgery;  Laterality: N/A;   APPLICATION OF ROBOTIC ASSISTANCE FOR SPINAL PROCEDURE N/A 06/28/2020   Procedure: APPLICATION OF ROBOTIC ASSISTANCE FOR SPINAL PROCEDURE;  Surgeon:  Colon Shove, MD;  Location: [facility] OR;  Service: Neurosurgery;  Laterality: N/A;   APPLICATION OF WOUND VAC N/A 04/25/2014   Procedure: APPLICATION OF WOUND VAC;  Surgeon: Lamar LELON Peaches, MD;  Location: [facility] OR[facility];  Service: Neurosurgery;  Laterality: N/A;   BACK SURGERY  2007   BLADDER SUSPENSION     2004   CERVICAL FUSION  1990   CERVICAL FUSION     2000   CYST EXCISION Left 1985   wrist   CYST EXCISION  1985   chest   FOOT  SURGERY Right 2009   reconstruction with hardware   FOOT SURGERY Left 2013   reconstruction with hardware   LUMBAR FUSION  03/2014   LUMBAR PERCUTANEOUS PEDICLE SCREW 4 LEVEL N/A 06/28/2020   Procedure: LUMBAR PERCUTANEOUS PEDICLE SCREW Thoracic Ten to Lumbar Three, Laminectomy Thoracic Twelve--Lumbar Two;  Surgeon: Colon Shove, MD;  Location: [facility] OR;  Service: Neurosurgery;  Laterality: N/A;  posterior   LUMBAR WOUND DEBRIDEMENT N/A 04/25/2014   Procedure: LUMBAR WOUND DEBRIDEMENT Placement of Woundvac);  Surgeon: Lamar LELON Peaches, MD;  Location: [facility];  Service: Neurosurgery;  Laterality: N/A;   PICC LINE PLACE PERIPHERAL ([facility] HX)     2015-infusion therapy secondary to STAPH infection    POSTERIOR CERVICAL LAMINECTOMY WITH MET- RX Left 04/25/2021   Procedure: Cervical seven-Thoracic one; Thoracic one-two Left Laminectomy/Foraminotomy with metrix;  Surgeon: Colon Shove, MD;  Location: [facility] OR;  Service: Neurosurgery;  Laterality: Left;   TONSILLECTOMY     TOTAL KNEE ARTHROPLASTY Bilateral 02/27/2015   Procedure: RIGHT TOTAL KNEE ARTHROPLASTY, LEFT KNEE CORTISONE INJECTION;  Surgeon: Donnice Car, MD;  Location: [facility] ORS;  Service: Orthopedics;  Laterality: Bilateral;   TOTAL KNEE ARTHROPLASTY Left 06/19/2015   Procedure: LEFT TOTAL KNEE ARTHROPLASTY;  Surgeon: Donnice Car, MD;  Location: [facility] ORS;  Service: Orthopedics;  Laterality: Left;   TUBAL LIGATION      Allergies  Allergies[1]  Home Medications    Prior to Admission medications  Medication Sig Start Date End Date Taking? Authorizing Provider  acetaminophen  (TYLENOL ) 650 MG CR tablet Take 1,300 mg by mouth every 8 (eight) hours as needed for pain.    [provider]  ALPRAZolam  (XANAX ) 0.25 MG tablet Take 0.25 mg by mouth at bedtime as needed for sleep.    [provider]  aspirin  EC 81 MG tablet Take 81 mg by mouth daily.    [provider]  buPROPion  (WELLBUTRIN  XL) 150 MG 24 hr tablet Take 450 mg  by mouth daily.    [provider]  hydroxychloroquine (PLAQUENIL) 200 MG tablet Take 200 mg by mouth 2 (two) times daily.    [provider]  leflunomide  (ARAVA ) 20 MG tablet Take 20 mg by mouth at bedtime.    [provider]  levothyroxine  (SYNTHROID ) 200 MCG tablet Take 200 mcg by mouth See admin instructions. Take 200 mcg at bedtime except on Saturday take 100 mcg. Skip dose on Sundays.    [provider]  losartan -hydrochlorothiazide  (HYZAAR) 100-12.5 MG per tablet Take 1 tablet by mouth every morning.     [provider]  meclizine  (ANTIVERT ) 25 MG tablet Take 25 mg by mouth 2 (two) times daily as needed for dizziness.    [provider]  methocarbamol  (ROBAXIN ) 500 MG tablet Take 1 tablet (500 mg total) by mouth every 6 (six) hours as needed for muscle spasms. 04/26/21   Colon Shove, MD  omeprazole (PRILOSEC) 20 MG capsule Take 20  mg by mouth daily.    [provider]  pramipexole  (MIRAPEX ) 0.125 MG tablet Take 0.125 mg by mouth 3 (three) times daily.    [provider]  rosuvastatin  (CRESTOR ) 40 MG tablet Take 40 mg by mouth daily.    [provider]  senna (SENOKOT) 8.6 MG tablet Take 1 tablet by mouth daily. All vegatable    [provider]  traMADol  (ULTRAM ) 50 MG tablet Take 50-100 mg by mouth every 6 (six) hours as needed for pain. 04/02/21   [provider]  Vitamin D, Ergocalciferol, (DRISDOL) 1.25 MG (50000 UNIT) CAPS capsule Take 50,000 Units by mouth every Sunday. 11/25/19   [provider]    Physical Exam    Vital Signs:  [name] does not have vital signs available for review today.  Given telephonic nature of communication, physical exam is limited. AAOx3. NAD. Normal affect.  Speech and respirations are unlabored.  Accessory Clinical Findings    None  Assessment & Plan    1.  Preoperative Cardiovascular Risk Assessment: According to the Revised Cardiac  Risk Index (RCRI), her Perioperative Risk of Major Cardiac Event is (%): 0.4. Her Functional Capacity in METs is: 4.06 according to the [facility] Activity Status Index (DASI). The patient was advised that if she develops new symptoms prior to surgery to contact our office to arrange for a follow-up visit, and she verbalized understanding. Per AHA/ACC guidelines, she is deemed acceptable risk for the planned procedure without additional cardiovascular testing. Will route to surgical team so they are aware.   Regarding ASA therapy, we recommend continuation of ASA throughout the perioperative period.  However, if the surgeon feels that cessation of ASA is required in the perioperative period, it may be stopped 5-7 days prior to surgery with a plan to resume it as soon as felt to be feasible from a surgical standpoint in the post-operative period. - She tells me that she was advised to hold this for three days prior to her procedure.  A copy of this note will be routed to requesting surgeon.  Time:   Today, I have spent 10 minutes with the patient with telehealth technology discussing medical history, symptoms, and management plan.     Brianna GORMAN Cleaves, [name]  07/06/2024, 1:24 PM     [1]  Allergies Allergen Reactions   Prednisone Other (See Comments)    Chest pain   Clindamycin/Lincomycin Rash   Influenza Vaccines Hives   Lodine [Etodolac] Rash   "

## 2024-08-05 ENCOUNTER — Ambulatory Visit: Admitting: Internal Medicine
